# Patient Record
Sex: Male | Born: 1955 | Race: White | Hispanic: No | Marital: Married | Smoking: Former smoker
Health system: Southern US, Community
[De-identification: ages and names within clinical notes are randomized; demographics above are authoritative.]

## PROBLEM LIST (undated history)

## (undated) DIAGNOSIS — E119 Type 2 diabetes mellitus without complications: Secondary | ICD-10-CM

## (undated) DIAGNOSIS — K644 Residual hemorrhoidal skin tags: Secondary | ICD-10-CM

## (undated) DIAGNOSIS — H269 Unspecified cataract: Secondary | ICD-10-CM

## (undated) DIAGNOSIS — N529 Male erectile dysfunction, unspecified: Secondary | ICD-10-CM

## (undated) DIAGNOSIS — R1310 Dysphagia, unspecified: Secondary | ICD-10-CM

## (undated) DIAGNOSIS — Q796 Ehlers-Danlos syndrome, unspecified: Secondary | ICD-10-CM

## (undated) DIAGNOSIS — I059 Rheumatic mitral valve disease, unspecified: Secondary | ICD-10-CM

## (undated) HISTORY — DX: Ehlers-Danlos syndrome, unspecified: Q79.60

## (undated) HISTORY — DX: Rheumatic mitral valve disease, unspecified: I05.9

## (undated) HISTORY — DX: Residual hemorrhoidal skin tags: K64.4

## (undated) HISTORY — DX: Type 2 diabetes mellitus without complications: E11.9

## (undated) HISTORY — PX: COLONOSCOPY: SHX174

## (undated) HISTORY — DX: Unspecified cataract: H26.9

## (undated) HISTORY — DX: Dysphagia, unspecified: R13.10

## (undated) HISTORY — DX: Male erectile dysfunction, unspecified: N52.9

## (undated) HISTORY — PX: CARDIAC CATHETERIZATION: SHX172

---

## 2006-08-07 ENCOUNTER — Ambulatory Visit: Payer: Self-pay | Admitting: Gastroenterology

## 2006-09-01 HISTORY — PX: COLONOSCOPY: SHX174

## 2006-09-10 ENCOUNTER — Ambulatory Visit: Payer: Self-pay | Admitting: Gastroenterology

## 2006-09-24 LAB — HM COLONOSCOPY: HM Colonoscopy: NORMAL

## 2009-01-18 ENCOUNTER — Encounter: Admission: RE | Admit: 2009-01-18 | Discharge: 2009-01-18 | Payer: Self-pay | Admitting: Orthopedic Surgery

## 2009-05-17 ENCOUNTER — Ambulatory Visit: Payer: Self-pay | Admitting: Family Medicine

## 2009-05-17 DIAGNOSIS — N529 Male erectile dysfunction, unspecified: Secondary | ICD-10-CM | POA: Insufficient documentation

## 2009-05-17 DIAGNOSIS — I059 Rheumatic mitral valve disease, unspecified: Secondary | ICD-10-CM | POA: Insufficient documentation

## 2009-05-17 DIAGNOSIS — K644 Residual hemorrhoidal skin tags: Secondary | ICD-10-CM | POA: Insufficient documentation

## 2009-05-17 HISTORY — DX: Residual hemorrhoidal skin tags: K64.4

## 2009-05-17 HISTORY — DX: Male erectile dysfunction, unspecified: N52.9

## 2009-05-17 HISTORY — DX: Rheumatic mitral valve disease, unspecified: I05.9

## 2010-05-28 ENCOUNTER — Ambulatory Visit: Payer: Self-pay | Admitting: Family Medicine

## 2010-05-28 DIAGNOSIS — R1310 Dysphagia, unspecified: Secondary | ICD-10-CM | POA: Insufficient documentation

## 2010-05-28 DIAGNOSIS — F411 Generalized anxiety disorder: Secondary | ICD-10-CM | POA: Insufficient documentation

## 2010-05-28 HISTORY — DX: Dysphagia, unspecified: R13.10

## 2010-06-03 ENCOUNTER — Ambulatory Visit (HOSPITAL_COMMUNITY): Admission: RE | Admit: 2010-06-03 | Discharge: 2010-06-03 | Payer: Self-pay | Admitting: Family Medicine

## 2010-10-01 NOTE — Assessment & Plan Note (Signed)
Summary: MED CHECK/REFILL/CJR/pt rescd//ccm   Vital Signs:  Patient profile:   55 year old male Weight:      226 pounds Temp:     97.8 degrees F oral BP sitting:   120 / 80  (left arm) Cuff size:   large  Vitals Entered By: Sid Falcon LPN (May 28, 2010 10:00 AM)  History of Present Illness: Patient is here for medical followup.  Medical problems include history of anxiety, erectile dysfunction, Ehlers-Danlos with history of mitral valve prolapse. He has not had any dyspnea or chest pain. Anxiety and mood symptoms are well-controlled. Needs refills of Zoloft. Takes Cialis for erectile dysfunction.  New problem of intermittent dysphagia with solid foods. His symptoms are inconsistent and relatively mild. He has noticed that food seems slower going down at times.  No problem with liquids.  No abdominal pain.   No history of smoking. Occasional alcohol use. No appetite or weight changes. No hematemesis. No pain with swallowing.  Allergies (verified): No Known Drug Allergies  Past History:  Family History: Last updated: 05/17/2009 Family History Diabetes father Family History Hypertension father Family History Lung cancer father Family history basel cell father  Social History: Last updated: 05/17/2009 Occupation:  Airline pilot Married Never Smoked Alcohol use-no Regular exercise-no  Risk Factors: Exercise: no (05/17/2009)  Risk Factors: Smoking Status: never (05/17/2009)  Past Medical History: Mitro Valve Prolapse Ehler's Danlos Erectlile dysfunction. Anxiety PMH-FH-SH reviewed for relevance  Review of Systems  The patient denies anorexia, fever, weight loss, chest pain, abdominal pain, melena, hematochezia, and severe indigestion/heartburn.    Physical Exam  General:  Well-developed,well-nourished,in no acute distress; alert,appropriate and cooperative throughout examination Head:  Normocephalic and atraumatic without obvious abnormalities. No apparent  alopecia or balding. Mouth:  Oral mucosa and oropharynx without lesions or exudates.  Teeth in good repair. Neck:  No deformities, masses, or tenderness noted. Lungs:  Normal respiratory effort, chest expands symmetrically. Lungs are clear to auscultation, no crackles or wheezes. Heart:  regular rhythm and rate with murmur which is 2/6 left arm border and right upper sternal border. He has midsystolic click Abdomen:  Bowel sounds positive,abdomen soft and non-tender without masses, organomegaly or hernias noted. Extremities:  No clubbing, cyanosis, edema, or deformity noted with normal full range of motion of all joints.     Impression & Recommendations:  Problem # 1:  ERECTILE DYSFUNCTION, ORGANIC (ICD-607.84)  His updated medication list for this problem includes:    Cialis 20 Mg Tabs (Tadalafil) ..... One by mouth every other day as needed  Problem # 2:  DYSPHAGIA UNSPECIFIED (ICD-787.20) Assessment: New DDX esoph spasm, Schatzki ring, vs other.  No worrisome symptoms such as weight loss.  Barium swallow study. Orders: Radiology Referral (Radiology)  Problem # 3:  ANXIETY STATE, UNSPECIFIED (ICD-300.00) refilled med for one year. His updated medication list for this problem includes:    Zoloft 50 Mg Tabs (Sertraline hcl) ..... Once daily  Complete Medication List: 1)  Zoloft 50 Mg Tabs (Sertraline hcl) .... Once daily 2)  Triamcinolone Acetonide 0.1 % Crea (Triamcinolone acetonide) .... Apply to affected area two times a day as needed 3)  Cialis 20 Mg Tabs (Tadalafil) .... One by mouth every other day as needed  Other Orders: Admin 1st Vaccine (95621) Flu Vaccine 48yrs + (30865)  Patient Instructions: 1)  Consider complete physical later this year. Prescriptions: CIALIS 20 MG TABS (TADALAFIL) one by mouth every other day as needed  #12 x 3   Entered and Authorized  by:   Evelena Peat MD   Signed by:   Evelena Peat MD on 05/28/2010   Method used:   [facility]         [facility]  [place] [address] [facility]       [address]       [place], [place]  [address]       Ph: [phone] or [phone]       Fax: [phone]   RxID:   [id number] ZOLOFT 50 MG TABS (SERTRALINE HCL) once daily  #90 x 3   Entered and Authorized by:   Evelena Peat MD   Signed by:   Evelena Peat MD on 05/28/2010   Method used:   [facility]        [facility]  [place] [address]       [address]       [place], [place]  [address]       Ph: [phone] or [phone]       Fax: [phone]   RxID:   [id number]     Flu Vaccine Consent Questions     Do you have a history of severe allergic reactions to this vaccine? no    Any prior history of allergic reactions to egg and/or gelatin? no    Do you have a sensitivity to the preservative Thimersol? no    Do you have a past history of Guillan-Barre Syndrome? no    Do you currently have an acute febrile illness? no    Have you ever had a severe reaction to latex? no    Vaccine information given and explained to patient? yes    Are you currently pregnant? no    Lot Number:[id number]   Exp Date:03/01/2011   Site Given  Left Deltoid IMbflu

## 2010-10-01 NOTE — Assessment & Plan Note (Signed)
Summary: TO EST-[place]//CCM   Vital Signs:  Patient profile:   55 year old male Height:      73.50 inches Weight:      226 pounds BMI:     29.52 Temp:     98.2 degrees F oral Pulse rate:   72 / minute Pulse rhythm:   regular Resp:     12 per minute BP sitting:   120 / 80  (left arm) Cuff size:   regular  Vitals Entered By: Sid Falcon LPN (May 17, 2009 3:10 PM)  Nutrition Counseling: Patient's BMI is greater than 25 and therefore counseled on weight management options.  History of Present Illness: Patient is seen to establish care.   history of Ehlers-Danlos syndrome and mitral valve prolapse.  he has had prior history of depression and would like to get back on sertraline 50 mg daily which is tolerated well the past. Also history of erectile dysfunction and takes Cialis for that. He has recently had some external hemorrhoids which have caused some itching and irritation. Previous colonoscopy 3 years ago which was normal. No recent constipation symptoms. Preparation H without relief.  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 Alcohol use-no Regular exercise-no  Risk Factors: Exercise: no (05/17/2009)  Risk Factors: Smoking Status: never (05/17/2009)  Past Medical History: Mitro Valve Prolapse  Family History: Family History Diabetes father Family History Hypertension father Family History Lung cancer father Family history basel cell father  Social History: Occupation:  Airline pilot Married Never Smoked Alcohol use-no Regular  exercise-no Occupation:  employed Smoking Status:  never Does Patient Exercise:  no  Review of Systems  The patient denies anorexia, fever, weight loss, chest pain, dyspnea on exertion, peripheral edema, abdominal pain, syncope, headaches, severe indigestion/heartburn, and muscle weakness.    Physical Exam  General:  Well-developed,well-nourished,in no acute distress; alert,appropriate and cooperative throughout examination Mouth:  Oral mucosa and oropharynx without lesions or exudates.  Teeth in good repair. Neck:  No deformities, masses, or tenderness noted. Lungs:  Normal respiratory effort, chest expands symmetrically. Lungs are clear to auscultation, no crackles or wheezes. Heart:  regular rhythm and rate with 2-3/6 systolic murmur left sternal border. Extremities:  no edema noted. Psych:  Cognition and judgment appear intact. Alert and cooperative with normal attention span and concentration. No apparent delusions, illusions, hallucinations   Impression & Recommendations:  Problem # 1:  MITRAL VALVE PROLAPSE (ICD-424.0) Asymptomatic.  Problem # 2:  ERECTILE DYSFUNCTION, ORGANIC (ICD-607.84) refill cialis. His updated medication list for this problem includes:    Cialis 20 Mg Tabs (Tadalafil) ..... One by mouth every other day as needed  Problem # 3:  EXTERNAL HEMORRHOIDS (ICD-455.3) try triamcinolone 0.1% cream as needed.  Complete Medication List: 1)  Zoloft 50 Mg Tabs (Sertraline hcl) .... Once daily 2)  Triamcinolone Acetonide 0.1 % Crea (Triamcinolone acetonide) .... Apply to affected area two times a day as needed 3)  Cialis 20 Mg Tabs (Tadalafil) .... One by mouth every other day as needed  Patient Instructions: 1)  It is important that you exercise reguarly at least 20 minutes 5 times a week. If you develop  chest pain, have severe difficulty breathing, or feel very tired, stop exercising immediately and seek medical attention.  Prescriptions: TRIAMCINOLONE  ACETONIDE 0.1 % CREA (TRIAMCINOLONE ACETONIDE) apply to affected area two times a day as needed  #30 gm x 1   Entered and Authorized by:   Evelena Peat MD   Signed by:   Evelena Peat MD on 05/17/2009   Method used:   [facility]        [facility]  [place] [address]       [address]       [place], [place]  [address]       Ph: [phone] or [phone]       Fax: [phone]   RxID:   [id number] ZOLOFT 50 MG TABS (SERTRALINE HCL) once daily  #90 x 3   Entered and Authorized by:   Evelena Peat MD   Signed by:   Evelena Peat MD on 05/17/2009   Method used:   [facility]        [facility]  [place] [address]       [address]       [place], [place]  [address]       Ph: [phone] or [phone]       Fax: [phone]   RxID:   [id number] CIALIS 20 MG TABS (TADALAFIL) one by mouth every other day as needed  #4 x 11   Entered and Authorized by:   Evelena Peat MD   Signed by:   Evelena Peat MD on 05/17/2009   Method used:   [facility]        [facility]  [place] [address]       [address]       [place], [place]  [address]       Ph: [phone] or [phone]       Fax: [phone]   RxID:   [id number] TRIAMCINOLONE ACETONIDE 0.1 % CREA (TRIAMCINOLONE ACETONIDE) apply to affected area two times a day as needed  #30 gm x 1   Entered and Authorized by:   Evelena Peat MD   Signed by:   Evelena Peat MD on 05/17/2009   Method used:   Print then Give to Patient   RxID:   [id number]   Preventive Care Screening  Colonoscopy:    Date:  09/01/2006    Results:  normal

## 2010-12-24 ENCOUNTER — Ambulatory Visit (INDEPENDENT_AMBULATORY_CARE_PROVIDER_SITE_OTHER): Payer: BC Managed Care – PPO | Admitting: Family Medicine

## 2010-12-24 ENCOUNTER — Encounter: Payer: Self-pay | Admitting: Family Medicine

## 2010-12-24 VITALS — BP 120/84 | Temp 98.6°F | Ht 73.25 in | Wt 231.0 lb

## 2010-12-24 DIAGNOSIS — S8010XA Contusion of unspecified lower leg, initial encounter: Secondary | ICD-10-CM

## 2010-12-24 NOTE — Patient Instructions (Signed)
Consider ACE wrap early in morning to decrease edema.

## 2010-12-24 NOTE — Progress Notes (Signed)
  Subjective:    Patient ID: Terry Johns, male    DOB: 05/19/1956, 55 y.o.   MRN: [id number]  HPI Patient seen with acute left leg injury which occurred about 9 days ago. Heavy metal chair fell on the leg directly. Some immediate swelling. Was able to ambulate without difficulty. Had significant bruising noted skin. His leg is actually less swollen and doing some better since the injury. He denies any foot pain. No weakness   Review of Systems     Objective:   Physical Exam  Constitutional: He appears well-developed and well-nourished.  Cardiovascular: Normal rate, regular rhythm and normal heart sounds.   No murmur heard. Pulmonary/Chest: Effort normal and breath sounds normal. He has no wheezes. He has no rales.  Musculoskeletal:       Patient has extensive ecchymosis involving the foot ankle and lower leg region. He has what appears to be a small hematoma medial distal right leg.  This is minimally tender to palpation. Achilles tendon is intact. Full range of motion with plantar flexion dorsiflexion. No significant bony tenderness fibula or tibia          Assessment & Plan:  Hematoma right lower leg related to contusion. Reassurance given. Continue elevation and consider compressive wrap to reduce edema if on his feet a lot.

## 2011-01-28 ENCOUNTER — Observation Stay (HOSPITAL_COMMUNITY)
Admission: EM | Admit: 2011-01-28 | Discharge: 2011-01-29 | Disposition: A | Discharge status: Home / Self Care | Payer: BC Managed Care – PPO | Attending: Emergency Medicine | Admitting: Emergency Medicine

## 2011-01-28 ENCOUNTER — Emergency Department (HOSPITAL_COMMUNITY): Payer: BC Managed Care – PPO

## 2011-01-28 ENCOUNTER — Telehealth: Payer: Self-pay | Admitting: *Deleted

## 2011-01-28 DIAGNOSIS — R61 Generalized hyperhidrosis: Secondary | ICD-10-CM | POA: Insufficient documentation

## 2011-01-28 DIAGNOSIS — I059 Rheumatic mitral valve disease, unspecified: Secondary | ICD-10-CM | POA: Insufficient documentation

## 2011-01-28 DIAGNOSIS — R0602 Shortness of breath: Principal | ICD-10-CM | POA: Insufficient documentation

## 2011-01-28 DIAGNOSIS — R5383 Other fatigue: Secondary | ICD-10-CM | POA: Insufficient documentation

## 2011-01-28 DIAGNOSIS — F411 Generalized anxiety disorder: Secondary | ICD-10-CM | POA: Insufficient documentation

## 2011-01-28 DIAGNOSIS — R5381 Other malaise: Secondary | ICD-10-CM | POA: Insufficient documentation

## 2011-01-28 LAB — CBC
HCT: 42.1 % (ref 39.0–52.0)
Hemoglobin: 14.9 g/dL (ref 13.0–17.0)
MCH: 29.3 pg (ref 26.0–34.0)
MCHC: 35.4 g/dL (ref 30.0–36.0)
MCV: 82.9 fL (ref 78.0–100.0)
Platelets: 206 10*3/uL (ref 150–400)
RBC: 5.08 MIL/uL (ref 4.22–5.81)
RDW: 13.1 % (ref 11.5–15.5)
WBC: 6.5 10*3/uL (ref 4.0–10.5)

## 2011-01-28 LAB — BASIC METABOLIC PANEL
BUN: 17 mg/dL (ref 6–23)
CO2: 25 mEq/L (ref 19–32)
Calcium: 8.8 mg/dL (ref 8.4–10.5)
Chloride: 103 mEq/L (ref 96–112)
Creatinine, Ser: 0.79 mg/dL (ref 0.4–1.5)
GFR calc Af Amer: >60 mL/min (ref >60)
GFR calc non Af Amer: >60 mL/min (ref >60)
Glucose, Bld: 93 mg/dL (ref 70–99)
Potassium: 4.1 mEq/L (ref 3.5–5.1)
Sodium: 139 mEq/L (ref 135–145)

## 2011-01-28 LAB — CK TOTAL AND CKMB (NOT AT ARMC)
CK, MB: 3.4 ng/mL (ref 0.3–4.0)
CK, MB: 3.2 ng/mL (ref 0.3–4.0)
CK, MB: 2.9 ng/mL (ref 0.3–4.0)
Relative Index: 2.7 — ABNORMAL HIGH (ref 0.0–2.5)
Relative Index: 2.6 — ABNORMAL HIGH (ref 0.0–2.5)
Relative Index: 2.6 — ABNORMAL HIGH (ref 0.0–2.5)
Total CK: 127 U/L (ref 7–232)
Total CK: 121 U/L (ref 7–232)
Total CK: 112 U/L (ref 7–232)

## 2011-01-28 LAB — URINALYSIS, ROUTINE W REFLEX MICROSCOPIC
Glucose, UA: NEGATIVE mg/dL
Hgb urine dipstick: NEGATIVE
Ketones, ur: NEGATIVE mg/dL
Nitrite: NEGATIVE
Protein, ur: NEGATIVE mg/dL
Specific Gravity, Urine: 1.029 (ref 1.005–1.030)
Urobilinogen, UA: 0.2 mg/dL (ref 0.0–1.0)
pH: 5.5 (ref 5.0–8.0)

## 2011-01-28 LAB — DIFFERENTIAL
Basophils Absolute: 0 10*3/uL (ref 0.0–0.1)
Basophils Relative: 1 % (ref 0–1)
Eosinophils Absolute: 0 10*3/uL (ref 0.0–0.7)
Eosinophils Relative: 1 % (ref 0–5)
Lymphocytes Relative: 25 % (ref 12–46)
Lymphs Abs: 1.6 10*3/uL (ref 0.7–4.0)
Monocytes Absolute: 0.6 10*3/uL (ref 0.1–1.0)
Monocytes Relative: 10 % (ref 3–12)
Neutro Abs: 4.1 10*3/uL (ref 1.7–7.7)
Neutrophils Relative %: 64 % (ref 43–77)

## 2011-01-28 LAB — TROPONIN I
Troponin I: <0.3 ng/mL (ref <0.30)
Troponin I: <0.3 ng/mL (ref <0.30)
Troponin I: <0.3 ng/mL (ref <0.30)

## 2011-01-28 NOTE — Telephone Encounter (Signed)
Pt has SOB upon exertion, heaviness in neck and arms.  Symptoms sound suspicious enough to send him to the ER ASAP.  He will go to ER now.

## 2011-01-28 NOTE — Telephone Encounter (Signed)
Agree with advice

## 2011-01-29 ENCOUNTER — Ambulatory Visit: Payer: BC Managed Care – PPO | Admitting: Family Medicine

## 2011-01-29 DIAGNOSIS — R072 Precordial pain: Secondary | ICD-10-CM

## 2011-01-29 LAB — TROPONIN I: Troponin I: <0.3 ng/mL (ref <0.30)

## 2011-01-29 LAB — CK TOTAL AND CKMB (NOT AT ARMC)
CK, MB: 2.6 ng/mL (ref 0.3–4.0)
Relative Index: 2.6 — ABNORMAL HIGH (ref 0.0–2.5)
Total CK: 101 U/L (ref 7–232)

## 2011-01-31 ENCOUNTER — Encounter: Payer: Self-pay | Admitting: Family Medicine

## 2011-01-31 ENCOUNTER — Ambulatory Visit (INDEPENDENT_AMBULATORY_CARE_PROVIDER_SITE_OTHER): Payer: BC Managed Care – PPO | Admitting: Family Medicine

## 2011-01-31 DIAGNOSIS — R0609 Other forms of dyspnea: Secondary | ICD-10-CM

## 2011-01-31 DIAGNOSIS — R0989 Other specified symptoms and signs involving the circulatory and respiratory systems: Secondary | ICD-10-CM

## 2011-01-31 DIAGNOSIS — Q796 Ehlers-Danlos syndrome, unspecified: Secondary | ICD-10-CM

## 2011-01-31 DIAGNOSIS — R05 Cough: Secondary | ICD-10-CM

## 2011-01-31 DIAGNOSIS — R059 Cough, unspecified: Secondary | ICD-10-CM

## 2011-01-31 DIAGNOSIS — R0600 Dyspnea, unspecified: Secondary | ICD-10-CM

## 2011-01-31 NOTE — Patient Instructions (Signed)
Gradually increase your exercise levels. Follow up promptly for any progressive chest pain.

## 2011-01-31 NOTE — Progress Notes (Signed)
  Subjective:    Patient ID: Terry Johns, male    DOB: 12-25-1955, 55 y.o.   MRN: [id number]  HPI Presents for hospital followup. Hospital records reviewed. He was out walking with his wife on Tuesday this week and during walking felt very short of breath. Went home and took a shower and wasfeeling better and with climbing stairs felt weak and short of breath and lightheaded. No syncope. At that point went to ER for further evaluation. No chest pain. No cardiac history other than mitral valve prolapse. History of Ehlers-Danlos syndrome. Patient was ruled out for MI. Labs were basically unremarkable. EKG without acute change.  ECHO basically unremarkable.  Patient underwent stress echo day after admission. Achieved target heart rate. No abnormalities noted. Normal ejection fraction. Patient has known history of mitral valve prolapse. Doing well since discharge. Has not tried any exercise.  He has a dry cough of one-day duration. No pleuritic pain. No recent GERD symptoms. Patient is nonsmoker  Review of Systems  Constitutional: Negative for fever, chills, activity change, appetite change, fatigue and unexpected weight change.  HENT: Negative for sore throat.   Respiratory: Positive for cough. Negative for wheezing.   Cardiovascular: Negative for chest pain, palpitations and leg swelling.  Musculoskeletal: Negative for arthralgias.  Skin: Negative for rash.  Neurological: Negative for dizziness, syncope, weakness and light-headedness.  Hematological: Negative for adenopathy. Does not bruise/bleed easily.       Objective:   Physical Exam  Constitutional: He is oriented to person, place, and time. He appears well-developed and well-nourished. No distress.  HENT:  Mouth/Throat: Oropharynx is clear and moist.  Neck: Neck supple.  Cardiovascular: Normal rate and regular rhythm.  Exam reveals no gallop.   Murmur heard. Pulmonary/Chest: Effort normal and breath sounds normal. No respiratory  distress. He has no wheezes. He has no rales.  Musculoskeletal: He exhibits no edema.  Lymphadenopathy:    He has no cervical adenopathy.  Neurological: He is alert and oriented to person, place, and time.          Assessment & Plan:  Recent exertional dyspnea. Negative evaluation as above. Patient will try to resume exercise regimen gradually and progress as tolerated. Prompt follow up for any recurrent problems. Cough.  Probable acute viral bronchitis.  Observe.

## 2011-06-12 ENCOUNTER — Ambulatory Visit (INDEPENDENT_AMBULATORY_CARE_PROVIDER_SITE_OTHER): Payer: BC Managed Care – PPO | Admitting: Family Medicine

## 2011-06-12 DIAGNOSIS — Z23 Encounter for immunization: Secondary | ICD-10-CM

## 2011-07-01 ENCOUNTER — Telehealth: Payer: Self-pay | Admitting: Family Medicine

## 2011-07-01 MED ORDER — TADALAFIL 20 MG PO TABS: 20.0000 mg | ORAL_TABLET | Freq: Every day | ORAL | Status: DC | PRN

## 2011-07-01 MED ORDER — SERTRALINE HCL 50 MG PO TABS: 50.0000 mg | ORAL_TABLET | Freq: Every day | ORAL | Status: DC

## 2011-07-01 NOTE — Telephone Encounter (Signed)
Pt requesting refill on sertraline (ZOLOFT) 50 MG tablet  tadalafil (CIALIS) 20 MG tablet   [facility]

## 2011-09-23 IMAGING — RF DG ESOPHAGUS
10 of 16 series · 14 of 24 positions shown · non-contrast
Comparison: None.

CLINICAL DATA: Chest pain/food sticks in throat

ESOPHOGRAM/BARIUM SWALLOW
TECHNIQUE: Single contrast examination was performed using thick
and thin barium.
Fluoroscopy time:  2.3 minutes.

[Series 1: run · 2 of 8 slices shown (1 of 10)]
[im 1/8]
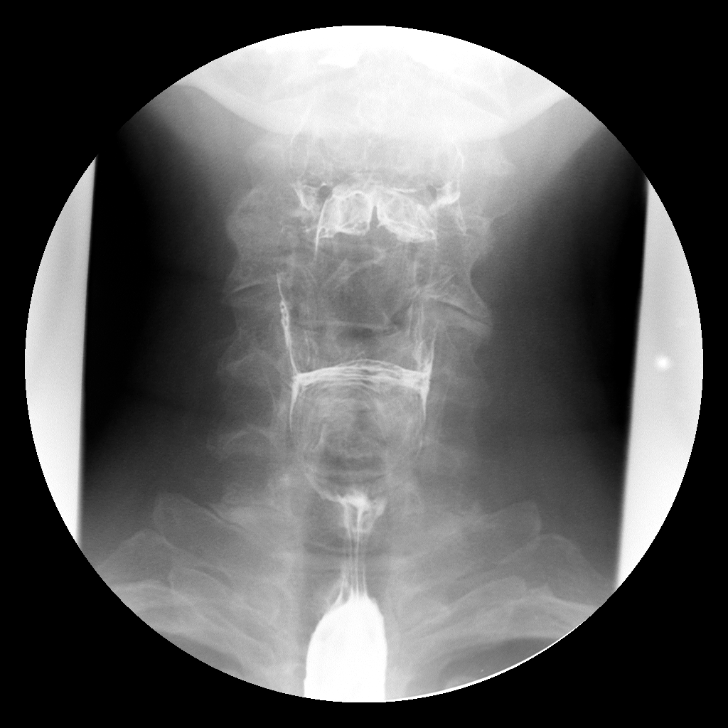
[im 5/8]
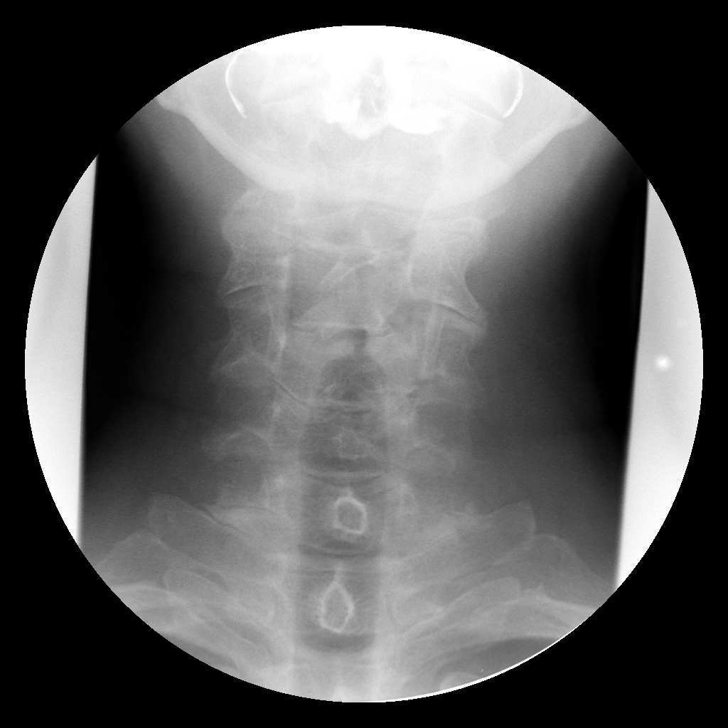

[Series 2: run · 4 of 10 slices shown (2 of 10)]
[im 1/10]
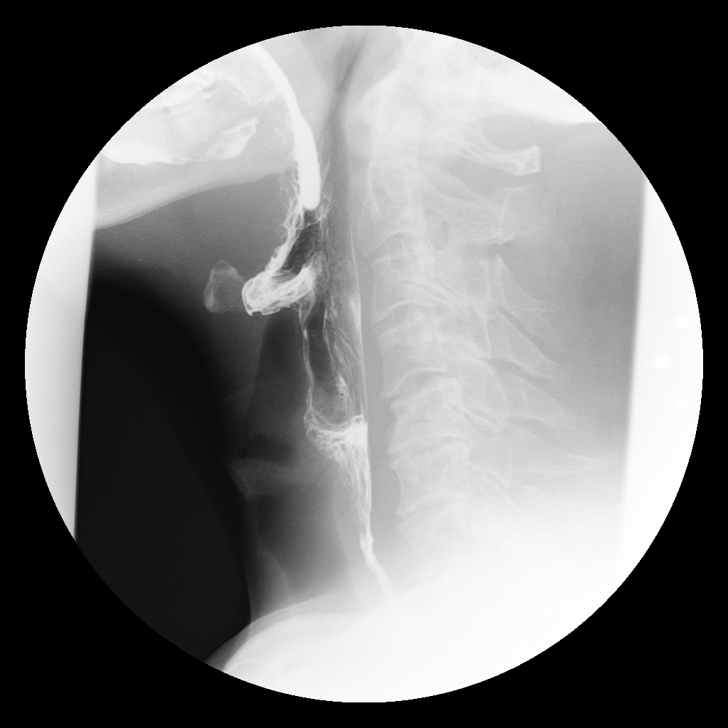
[im 4/10]
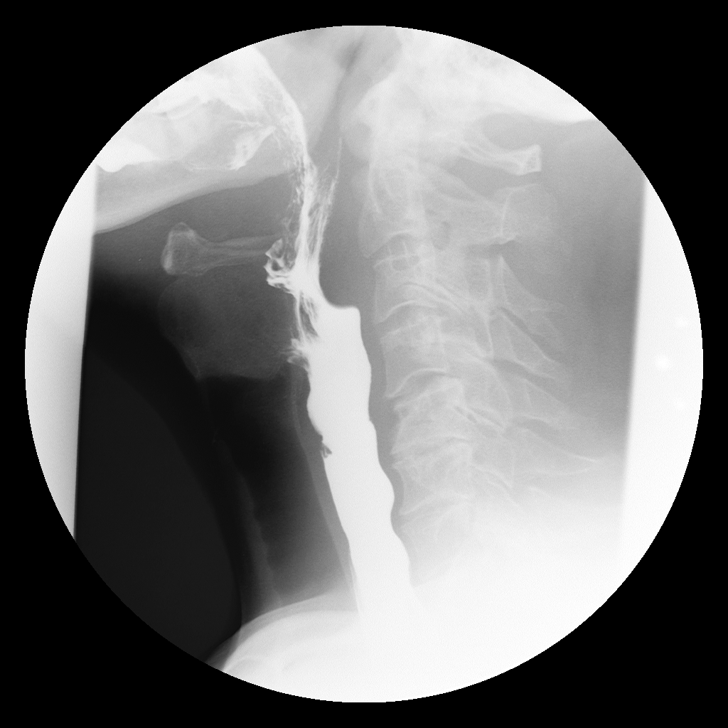
[im 6/10]
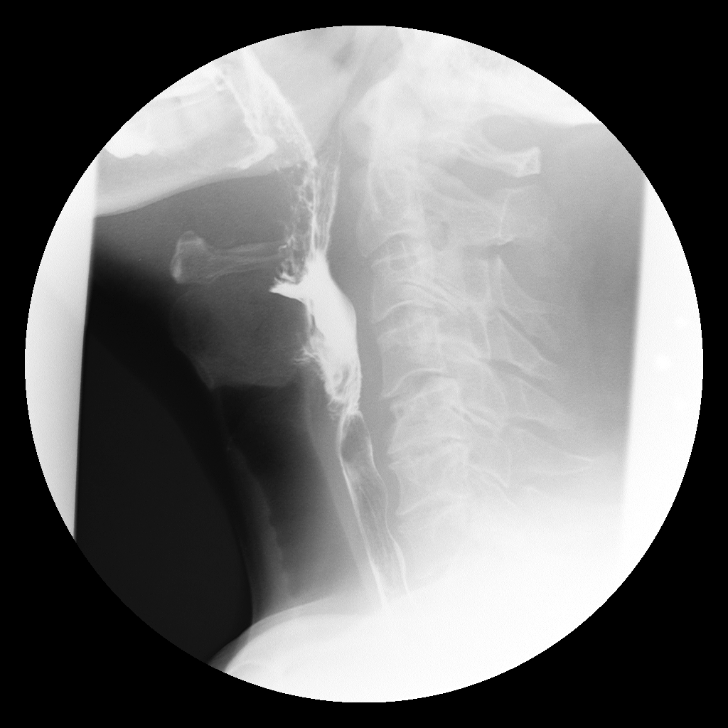
[im 10/10]
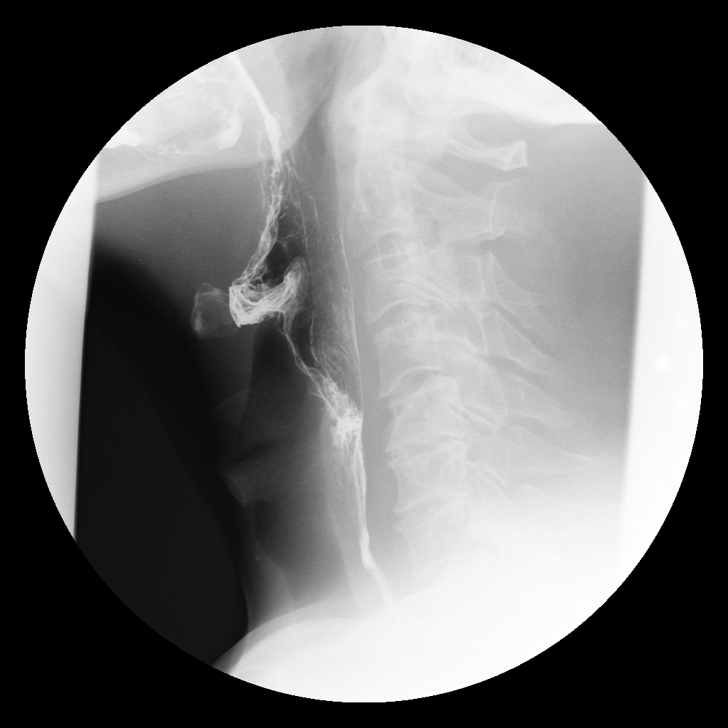

[Series 4: run · 1 of 1 slices shown (3 of 10)]
[im 1/1]
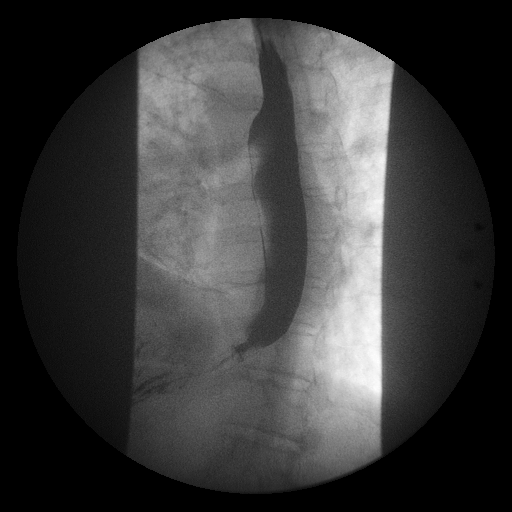

[Series 5: run · 1 of 1 slices shown (4 of 10)]
[im 1/1]
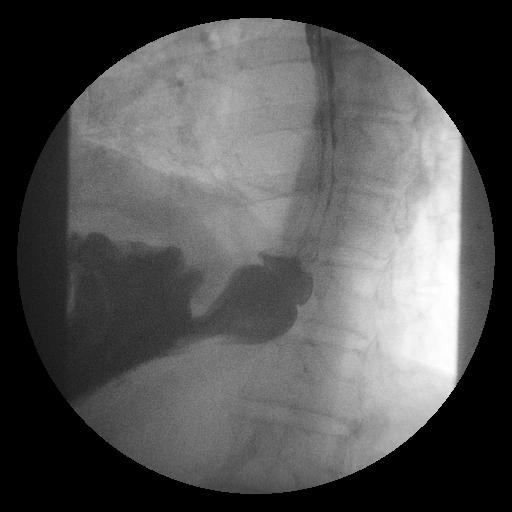

[Series 7: run · 1 of 1 slices shown (5 of 10)]
[im 1/1]
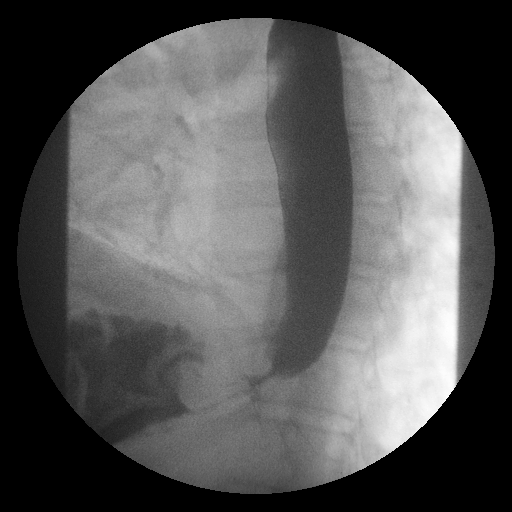

[Series 9: run · 1 of 1 slices shown (6 of 10)]
[im 1/1]
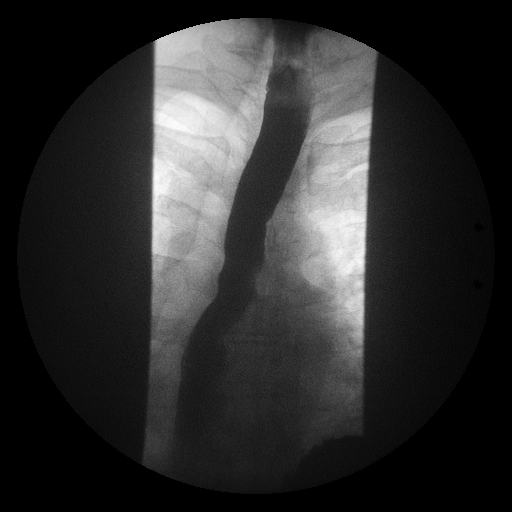

[Series 11: run · 1 of 1 slices shown (7 of 10)]
[im 1/1]
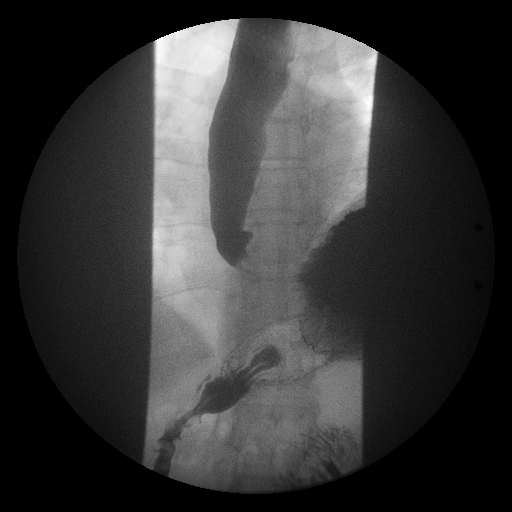

[Series 12: run · 1 of 1 slices shown (8 of 10)]
[im 1/1]
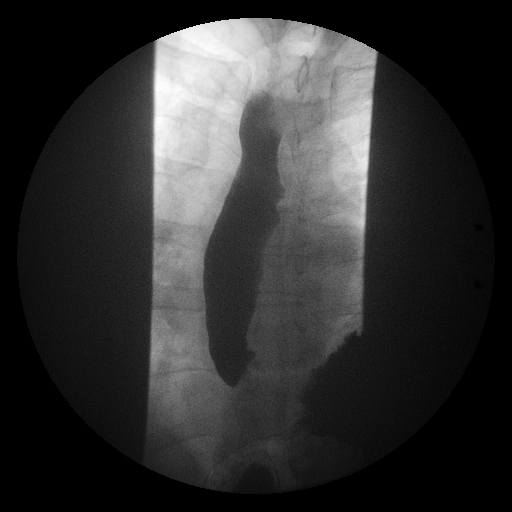

[Series 14: run · 1 of 1 slices shown (9 of 10)]
[im 1/1]
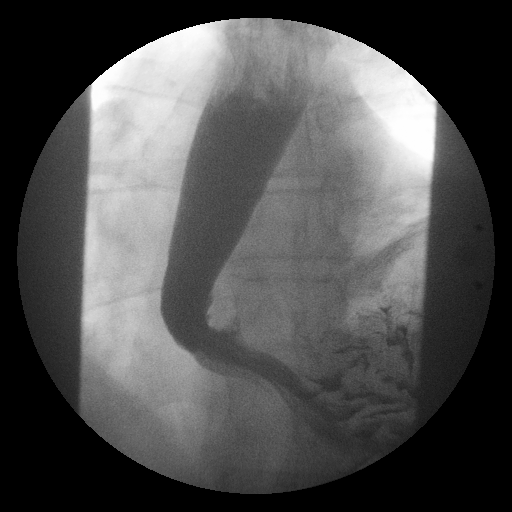

[Series 16: run · 1 of 1 slices shown (10 of 10)]
[im 1/1]
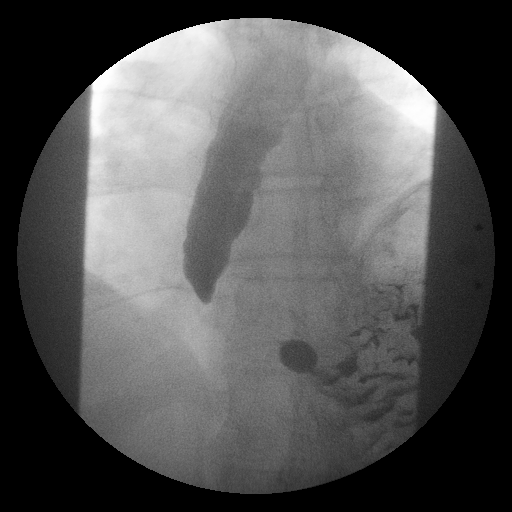

[14 of 24 positions shown; findings below may reference images not displayed]

FINDINGS: Three per second spot images through the cervical region
show no abnormality.  The swallowing mechanism is normal.  No
penetration or aspiration noted.

There are no lesions of the remainder of the esophagus.  There is,
however, a small sliding hiatal hernia.  There is no visible
narrowing of the lumen, but a 13 mm barium tablet took 30-40
seconds to traverse this region, suggesting a mild stricture.
There is also intermittent tertiary contraction of the esophagus
noted with ineffective peristalsis.

No reflux was demonstrated with Valsalva or water siphon maneuvers.
IMPRESSION: Small sliding hiatal hernia with probable mild stricture and some
intermittent spasm.  No reflux demonstrated at the time of this
exam.

## 2011-12-31 ENCOUNTER — Other Ambulatory Visit: Payer: Self-pay | Admitting: Family Medicine

## 2012-04-02 ENCOUNTER — Other Ambulatory Visit: Payer: Self-pay | Admitting: Family Medicine

## 2012-05-11 ENCOUNTER — Other Ambulatory Visit: Payer: Self-pay | Admitting: Family Medicine

## 2012-06-25 ENCOUNTER — Other Ambulatory Visit: Payer: Self-pay | Admitting: Family Medicine

## 2012-07-14 ENCOUNTER — Ambulatory Visit (INDEPENDENT_AMBULATORY_CARE_PROVIDER_SITE_OTHER): Payer: BC Managed Care – PPO | Admitting: *Deleted

## 2012-07-14 DIAGNOSIS — Z23 Encounter for immunization: Secondary | ICD-10-CM

## 2013-06-01 ENCOUNTER — Encounter: Payer: Self-pay | Admitting: Family Medicine

## 2013-06-01 ENCOUNTER — Ambulatory Visit (INDEPENDENT_AMBULATORY_CARE_PROVIDER_SITE_OTHER): Payer: BC Managed Care – PPO | Admitting: Family Medicine

## 2013-06-01 VITALS — BP 118/80 | HR 68 | Temp 98.2°F | Wt 223.0 lb

## 2013-06-01 DIAGNOSIS — I4949 Other premature depolarization: Secondary | ICD-10-CM

## 2013-06-01 DIAGNOSIS — I493 Ventricular premature depolarization: Secondary | ICD-10-CM

## 2013-06-01 DIAGNOSIS — R011 Cardiac murmur, unspecified: Secondary | ICD-10-CM

## 2013-06-01 NOTE — Patient Instructions (Addendum)
Premature Beats °A premature beat is an extra heartbeat that happens earlier than normal. Premature beats are called premature atrial contractions (PACs) or premature ventricular contractions (PVCs) depending on the area of the heart where they start. °CAUSES  °Premature beats may be brought on by a variety of factors including: °· Emotional stress. °· Lack of sleep. °· Caffeine. °· Asthma medicines. °· Stimulants. °· Herbal teas. °· Dietary supplements. °· Alcohol. °In most cases, premature beats are not dangerous and are not a sign of serious heart disease. Most patients evaluated for premature beats have completely normal heart function. Rarely, premature beats may be a sign of more significant heart problems or medical illness. °SYMPTOMS  °Premature beats may cause palpitations. This means you feel like your heart is skipping a beat or beating harder than usual. Sometimes, slight chest pain occurs with premature beats, lasting only a few seconds. This pain has been described as a "flopping" feeling inside the chest. In many cases, premature beats do not cause any symptoms and they are only detected when an electrocardiography test (EKG) or heart monitoring is performed. °DIAGNOSIS  °Your caregiver may run some tests to evaluate your heart such as an EKG or echocardiography. You may need to wear a portable heart monitor for several days to record the electrical activity of your heart. Blood testing may also be performed to check your electrolytes and thyroid function. °TREATMENT  °Premature beats usually go away with rest. If the problem continues, your caregiver will determine a treatment plan for you.  °HOME CARE INSTRUCTIONS °· Get plenty of rest over the next few days until your symptoms improve. °· Avoid coffee, tea, alcohol, and soda (pop, cola). °· Do not smoke. °SEEK MEDICAL CARE IF: °· Your symptoms continue after 1 to 2 days of rest. °· You have new symptoms, such as chest pain or trouble  breathing. °SEEK IMMEDIATE MEDICAL CARE IF: °· You have severe chest pain or abdominal pain. °· You have pain that radiates into the neck, arm, or jaw. °· You faint or have extreme weakness. °· You have shortness of breath. °· Your heartbeat races for more than 5 seconds. °MAKE SURE YOU: °· Understand these instructions. °· Will watch your condition. °· Will get help right away if you are not doing well or get worse. °Document Released: 09/25/2004 Document Revised: 11/10/2011 Document Reviewed: 04/21/2011 °[employer]® Patient Information ©2014 [employer]. ° °

## 2013-06-01 NOTE — Progress Notes (Signed)
  Subjective:    Patient ID: Terry Johns, male    DOB: Jan 28, 1956, 57 y.o.   MRN: [id number]  HPI Is seen regarding recent PACs and PVCs there were seen on EKG He was out in [place] visiting friends and his friend had some type of new technology that performed EKG. He had PACs and PVCs which are somewhat infrequent. Patient is asymptomatic of the fat has had a couple episodes recently of dyspnea with things like climbing stairs. He has never had any dizziness or syncope. No chest pains. Minimal caffeine use. No consistent alcohol use.  Patient has history of Ehlers-Danlos syndrome. He has had previously diagnosed mitral prolapse but has not had echo in several years. He currently does not take any regular medications.  Past Medical History  Diagnosis Date  . Anxiety state, unspecified 05/28/2010  . MITRAL VALVE PROLAPSE 05/17/2009  . EXTERNAL HEMORRHOIDS 05/17/2009  . 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 DYSPHAGIA UNSPECIFIED 05/28/2010   No past surgical history on file.  reports that he has quit smoking. His smoking use included Cigarettes. He has a .3 pack-year smoking history. He does not have any smokeless tobacco history on file. His alcohol and drug histories are not on file. family history includes Cancer in his father; Diabetes in his father; Hypertension in his father. No Known Allergies    Review of Systems  Constitutional: Negative for fatigue.  Eyes: Negative for visual disturbance.  Respiratory: Negative for cough, chest tightness and shortness of breath.   Cardiovascular: Negative for chest pain, palpitations and leg swelling.  Neurological: Negative for dizziness, syncope, weakness, light-headedness and headaches.       Objective:   Physical Exam  Constitutional: He appears well-developed and well-nourished.  Neck: Neck supple. No thyromegaly present.  Cardiovascular: Normal rate.   Murmur heard. Patient has occasional premature beat. He has fairly  prominent at least 3/6 systolic murmur heard best left sternal border.  Pulmonary/Chest: Effort normal and breath sounds normal. No respiratory distress. He has no wheezes. He has no rales.  Musculoskeletal: He exhibits no edema.          Assessment & Plan:  Patient presents with EKG revealing recent PACs and PVCs we explained these are usually insignificant. Has had some recent mild dyspnea with exertion. History of Ehlers-Danlos syndrome and mitral prolapse. Repeat echocardiogram. Consider cardiology followup if this has shown any changes

## 2013-06-14 ENCOUNTER — Ambulatory Visit (HOSPITAL_COMMUNITY): Payer: BC Managed Care – PPO | Attending: Family Medicine

## 2013-06-14 DIAGNOSIS — R011 Cardiac murmur, unspecified: Secondary | ICD-10-CM | POA: Insufficient documentation

## 2013-06-14 NOTE — Progress Notes (Signed)
Echocardiogram performed.  

## 2013-09-21 ENCOUNTER — Encounter: Payer: Self-pay | Admitting: Family Medicine

## 2013-09-21 ENCOUNTER — Ambulatory Visit (INDEPENDENT_AMBULATORY_CARE_PROVIDER_SITE_OTHER): Payer: 59 | Admitting: Family Medicine

## 2013-09-21 VITALS — BP 120/68 | HR 101 | Temp 97.9°F | Wt 228.0 lb

## 2013-09-21 DIAGNOSIS — J209 Acute bronchitis, unspecified: Secondary | ICD-10-CM

## 2013-09-21 MED ORDER — HYDROCODONE-HOMATROPINE 5-1.5 MG/5ML PO SYRP: 5.0000 mL | ORAL_SOLUTION | Freq: Four times a day (QID) | ORAL | Status: AC | PRN

## 2013-09-21 NOTE — Patient Instructions (Signed)
Acute Bronchitis Bronchitis is inflammation of the airways that extend from the windpipe into the lungs (bronchi). The inflammation often causes mucus to develop. This leads to a cough, which is the most common symptom of bronchitis.  In acute bronchitis, the condition usually develops suddenly and goes away over time, usually in a couple weeks. Smoking, allergies, and asthma can make bronchitis worse. Repeated episodes of bronchitis may cause further lung problems.  CAUSES Acute bronchitis is most often caused by the same virus that causes a cold. The virus can spread from person to person (contagious).  SIGNS AND SYMPTOMS   Cough.   Fever.   Coughing up mucus.   Body aches.   Chest congestion.   Chills.   Shortness of breath.   Sore throat.  DIAGNOSIS  Acute bronchitis is usually diagnosed through a physical exam. Tests, such as chest X-rays, are sometimes done to rule out other conditions.  TREATMENT  Acute bronchitis usually goes away in a couple weeks. Often times, no medical treatment is necessary. Medicines are sometimes given for relief of fever or cough. Antibiotics are usually not needed but may be prescribed in certain situations. In some cases, an inhaler may be recommended to help reduce shortness of breath and control the cough. A cool mist vaporizer may also be used to help thin bronchial secretions and make it easier to clear the chest.  HOME CARE INSTRUCTIONS  Get plenty of rest.   Drink enough fluids to keep your urine clear or pale yellow (unless you have a medical condition that requires fluid restriction). Increasing fluids may help thin your secretions and will prevent dehydration.   Only take over-the-counter or prescription medicines as directed by your health care provider.   Avoid smoking and secondhand smoke. Exposure to cigarette smoke or irritating chemicals will make bronchitis worse. If you are a smoker, consider using nicotine gum or skin  patches to help control withdrawal symptoms. Quitting smoking will help your lungs heal faster.   Reduce the chances of another bout of acute bronchitis by washing your hands frequently, avoiding people with cold symptoms, and trying not to touch your hands to your mouth, nose, or eyes.   Follow up with your health care provider as directed.  SEEK MEDICAL CARE IF: Your symptoms do not improve after 1 week of treatment.  SEEK IMMEDIATE MEDICAL CARE IF:  You develop an increased fever or chills.   You have chest pain.   You have severe shortness of breath.  You have bloody sputum.   You develop dehydration.  You develop fainting.  You develop repeated vomiting.  You develop a severe headache. MAKE SURE YOU:   Understand these instructions.  Will watch your condition.  Will get help right away if you are not doing well or get worse. Document Released: 09/25/2004 Document Revised: 04/20/2013 Document Reviewed: 02/08/2013 [employer] Patient Information 2014 [employer].  

## 2013-09-21 NOTE — Progress Notes (Signed)
Pre visit review using our clinic review tool, if applicable. No additional management support is needed unless otherwise documented below in the visit note. 

## 2013-09-21 NOTE — Progress Notes (Signed)
   Subjective:    Patient ID: Terry Johns, male    DOB: 09/12/1955, 58 y.o.   MRN: [id number]  HPI Acute for cough which started last Tuesday. His cough worsened over the weekend by Saturday he had increased nasal congestion and especially severe nighttime cough. He's had some occasional right-sided thoracic back pain which he attributes to coughing. His nasal congestion has cleared somewhat. Cough is dry. Possibly some mild wheezing off and on. No dyspnea. No fever. He tried Mucinex DM and cough drops without any improvement. Patient nonsmoker.  Past Medical History  Diagnosis Date  . Anxiety state, unspecified 05/28/2010  . MITRAL VALVE PROLAPSE 05/17/2009  . EXTERNAL HEMORRHOIDS 05/17/2009  . 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 DYSPHAGIA UNSPECIFIED 05/28/2010   No past surgical history on file.  reports that he has quit smoking. His smoking use included Cigarettes. He has a .3 pack-year smoking history. He does not have any smokeless tobacco history on file. His alcohol and drug histories are not on file. family history includes Cancer in his father; Diabetes in his father; Hypertension in his father. No Known Allergies    Review of Systems  Constitutional: Negative for fever and chills.  HENT: Positive for congestion.   Respiratory: Positive for cough. Negative for shortness of breath and wheezing.   Cardiovascular: Negative for chest pain.  Gastrointestinal: Negative for nausea and vomiting.  Neurological: Negative for headaches.       Objective:   Physical Exam  Constitutional: He appears well-developed and well-nourished.  HENT:  Mouth/Throat: Oropharynx is clear and moist.  He has narrow ear canals but no acute findings  Neck: Neck supple.  Cardiovascular: Normal rate.   Pulmonary/Chest: Effort normal and breath sounds normal. No respiratory distress. He has no wheezes. He has no rales.  Musculoskeletal: He exhibits no edema.  Lymphadenopathy:    He has no  cervical adenopathy.          Assessment & Plan:  Acute bronchitis. Suspect viral. Hycodan cough syrup for severe nighttime cough. Followup promptly for fever or worsening symptoms

## 2013-10-21 ENCOUNTER — Other Ambulatory Visit: Payer: Self-pay | Admitting: Family Medicine

## 2013-11-10 ENCOUNTER — Ambulatory Visit (INDEPENDENT_AMBULATORY_CARE_PROVIDER_SITE_OTHER): Payer: 59 | Admitting: Family Medicine

## 2013-11-10 ENCOUNTER — Encounter: Payer: Self-pay | Admitting: Family Medicine

## 2013-11-10 VITALS — BP 128/72 | HR 83 | Wt 224.0 lb

## 2013-11-10 DIAGNOSIS — M722 Plantar fascial fibromatosis: Secondary | ICD-10-CM

## 2013-11-10 NOTE — Progress Notes (Signed)
Pre visit review using our clinic review tool, if applicable. No additional management support is needed unless otherwise documented below in the visit note. 

## 2013-11-10 NOTE — Progress Notes (Signed)
   Subjective:    Patient ID: Terry Johns, male    DOB: 1955-12-17, 58 y.o.   MRN: [id number]  Foot Pain Pertinent negatives include no numbness or weakness.    This is a with right heel pain. Present for about 4 months. No injury. He doesn't do any regular running. Pain is located over the plantar fascia region near attachment to the calcaneus. Worse first thing in the morning and after prolonged periods of sitting and generally improve slightly after activity. He is not any swelling. Has not tried any icing or stretching. No history of previous occurrence. Denies any Achilles pain. Pain is moderate. He is not using any heel inserts.  Past Medical History  Diagnosis Date  . Anxiety state, unspecified 05/28/2010  . MITRAL VALVE PROLAPSE 05/17/2009  . EXTERNAL HEMORRHOIDS 05/17/2009  . 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 DYSPHAGIA UNSPECIFIED 05/28/2010   No past surgical history on file.  reports that he has quit smoking. His smoking use included Cigarettes. He has a .3 pack-year smoking history. He does not have any smokeless tobacco history on file. His alcohol and drug histories are not on file. family history includes Cancer in his father; Diabetes in his father; Hypertension in his father. No Known Allergies   Review of Systems  Neurological: Negative for weakness and numbness.       Objective:   Physical Exam  Constitutional: He appears well-developed and well-nourished.  Cardiovascular: Normal rate and regular rhythm.   Musculoskeletal:  Right foot reveals no edema. No erythema. No ecchymosis. He has some tenderness over the right plantar fascia region. Achilles is intact and nontender.          Assessment & Plan:  Right plantar fasciitis. Would recommend stretching, icing, strengthening exercises given. Consider heel insert and arch supports. We discussed possible steroid injection at this point he wishes to try the above first.

## 2013-11-10 NOTE — Patient Instructions (Signed)
Plantar Fasciitis (Heel Spur Syndrome)  with Rehab  The plantar fascia is a fibrous, ligament-like, soft-tissue structure that spans the bottom of the foot. Plantar fasciitis is a condition that causes pain in the foot due to inflammation of the tissue.  SYMPTOMS   · Pain and tenderness on the underneath side of the foot.  · Pain that worsens with standing or walking.  CAUSES   Plantar fasciitis is caused by irritation and injury to the plantar fascia on the underneath side of the foot. Common mechanisms of injury include:  · Direct trauma to bottom of the foot.  · Damage to a small nerve that runs under the foot where the main fascia attaches to the heel bone.  · Stress placed on the plantar fascia due to bone spurs.  RISK INCREASES WITH:   · Activities that place stress on the plantar fascia (running, jumping, pivoting, or cutting).  · Poor strength and flexibility.  · Improperly fitted shoes.  · Tight calf muscles.  · Flat feet.  · Failure to warm-up properly before activity.  · Obesity.  PREVENTION  · Warm up and stretch properly before activity.  · Allow for adequate recovery between workouts.  · Maintain physical fitness:  · Strength, flexibility, and endurance.  · Cardiovascular fitness.  · Maintain a health body weight.  · Avoid stress on the plantar fascia.  · Wear properly fitted shoes, including arch supports for individuals who have flat feet.  PROGNOSIS   If treated properly, then the symptoms of plantar fasciitis usually resolve without surgery. However, occasionally surgery is necessary.  RELATED COMPLICATIONS   · Recurrent symptoms that may result in a chronic condition.  · Problems of the lower back that are caused by compensating for the injury, such as limping.  · Pain or weakness of the foot during push-off following surgery.  · Chronic inflammation, scarring, and partial or complete fascia tear, occurring more often from repeated injections.  TREATMENT   Treatment initially involves the use of  ice and medication to help reduce pain and inflammation. The use of strengthening and stretching exercises may help reduce pain with activity, especially stretches of the Achilles tendon. These exercises may be performed at home or with a therapist. Your caregiver may recommend that you use heel cups of arch supports to help reduce stress on the plantar fascia. Occasionally, corticosteroid injections are given to reduce inflammation. If symptoms persist for greater than 6 months despite non-surgical (conservative), then surgery may be recommended.   MEDICATION   · If pain medication is necessary, then nonsteroidal anti-inflammatory medications, such as aspirin and ibuprofen, or other minor pain relievers, such as acetaminophen, are often recommended.  · Do not take pain medication within 7 days before surgery.  · Prescription pain relievers may be given if deemed necessary by your caregiver. Use only as directed and only as much as you need.  · Corticosteroid injections may be given by your caregiver. These injections should be reserved for the most serious cases, because they may only be given a certain number of times.  HEAT AND COLD  · Cold treatment (icing) relieves pain and reduces inflammation. Cold treatment should be applied for 10 to 15 minutes every 2 to 3 hours for inflammation and pain and immediately after any activity that aggravates your symptoms. Use ice packs or massage the area with a piece of ice (ice massage).  · Heat treatment may be used prior to performing the stretching and strengthening activities prescribed   by your caregiver, physical therapist, or athletic trainer. Use a heat pack or soak the injury in warm water.  SEEK IMMEDIATE MEDICAL CARE IF:  · Treatment seems to offer no benefit, or the condition worsens.  · Any medications produce adverse side effects.  EXERCISES  RANGE OF MOTION (ROM) AND STRETCHING EXERCISES - Plantar Fasciitis (Heel Spur Syndrome)  These exercises may help you  when beginning to rehabilitate your injury. Your symptoms may resolve with or without further involvement from your physician, physical therapist or athletic trainer. While completing these exercises, remember:   · Restoring tissue flexibility helps normal motion to return to the joints. This allows healthier, less painful movement and activity.  · An effective stretch should be held for at least 30 seconds.  · A stretch should never be painful. You should only feel a gentle lengthening or release in the stretched tissue.  RANGE OF MOTION - Toe Extension, Flexion  · Sit with your right / left leg crossed over your opposite knee.  · Grasp your toes and gently pull them back toward the top of your foot. You should feel a stretch on the bottom of your toes and/or foot.  · Hold this stretch for __________ seconds.  · Now, gently pull your toes toward the bottom of your foot. You should feel a stretch on the top of your toes and or foot.  · Hold this stretch for __________ seconds.  Repeat __________ times. Complete this stretch __________ times per day.   RANGE OF MOTION - Ankle Dorsiflexion, Active Assisted  · Remove shoes and sit on a chair that is preferably not on a carpeted surface.  · Place right / left foot under knee. Extend your opposite leg for support.  · Keeping your heel down, slide your right / left foot back toward the chair until you feel a stretch at your ankle or calf. If you do not feel a stretch, slide your bottom forward to the edge of the chair, while still keeping your heel down.  · Hold this stretch for __________ seconds.  Repeat __________ times. Complete this stretch __________ times per day.   STRETCH  Gastroc, Standing  · Place hands on wall.  · Extend right / left leg, keeping the front knee somewhat bent.  · Slightly point your toes inward on your back foot.  · Keeping your right / left heel on the floor and your knee straight, shift your weight toward the wall, not allowing your back to  arch.  · You should feel a gentle stretch in the right / left calf. Hold this position for __________ seconds.  Repeat __________ times. Complete this stretch __________ times per day.  STRETCH  Soleus, Standing  · Place hands on wall.  · Extend right / left leg, keeping the other knee somewhat bent.  · Slightly point your toes inward on your back foot.  · Keep your right / left heel on the floor, bend your back knee, and slightly shift your weight over the back leg so that you feel a gentle stretch deep in your back calf.  · Hold this position for __________ seconds.  Repeat __________ times. Complete this stretch __________ times per day.  STRETCH  Gastrocsoleus, Standing   Note: This exercise can place a lot of stress on your foot and ankle. Please complete this exercise only if specifically instructed by your caregiver.   · Place the ball of your right / left foot on a step, keeping   your other foot firmly on the same step.  · Hold on to the wall or a rail for balance.  · Slowly lift your other foot, allowing your body weight to press your heel down over the edge of the step.  · You should feel a stretch in your right / left calf.  · Hold this position for __________ seconds.  · Repeat this exercise with a slight bend in your right / left knee.  Repeat __________ times. Complete this stretch __________ times per day.   STRENGTHENING EXERCISES - Plantar Fasciitis (Heel Spur Syndrome)   These exercises may help you when beginning to rehabilitate your injury. They may resolve your symptoms with or without further involvement from your physician, physical therapist or athletic trainer. While completing these exercises, remember:   · Muscles can gain both the endurance and the strength needed for everyday activities through controlled exercises.  · Complete these exercises as instructed by your physician, physical therapist or athletic trainer. Progress the resistance and repetitions only as guided.  STRENGTH - Towel  Curls  · Sit in a chair positioned on a non-carpeted surface.  · Place your foot on a towel, keeping your heel on the floor.  · Pull the towel toward your heel by only curling your toes. Keep your heel on the floor.  · If instructed by your physician, physical therapist or athletic trainer, add ____________________ at the end of the towel.  Repeat __________ times. Complete this exercise __________ times per day.  STRENGTH - Ankle Inversion  · Secure one end of a rubber exercise band/tubing to a fixed object (table, pole). Loop the other end around your foot just before your toes.  · Place your fists between your knees. This will focus your strengthening at your ankle.  · Slowly, pull your big toe up and in, making sure the band/tubing is positioned to resist the entire motion.  · Hold this position for __________ seconds.  · Have your muscles resist the band/tubing as it slowly pulls your foot back to the starting position.  Repeat __________ times. Complete this exercises __________ times per day.   Document Released: 08/18/2005 Document Revised: 11/10/2011 Document Reviewed: 11/30/2008  [employer]® Patient Information ©2014 [employer].

## 2014-01-24 ENCOUNTER — Telehealth: Payer: Self-pay | Admitting: Family Medicine

## 2014-01-24 NOTE — Telephone Encounter (Signed)
Pt is calling requesting PA for cialis 20 mg [id number]

## 2014-01-25 NOTE — Telephone Encounter (Signed)
I received a denial for Cialis 5 mg, quantity 3.  Pt must have a history of failure to 4 weeks of, or intolerance to an alphs-adrenergic blocking medication. Cardura (doxazosin), Flomax (tamsulosin), Hytrin (terazosin), Rapaflo (silodosin), or Uroxatral (alfuzosin).    Cialis 20 and 10 mg are a PLAN EXCLUSION.

## 2014-01-25 NOTE — Telephone Encounter (Signed)
Pt called requesting PA for Cialis 20 mg.  [facility] states Cialis 20 mg and 10 mg are a PLAN EXCLUSION.  I was able to submit a PA for Cialis 5 mg, quantity of 3 per 31 days.  It is currently pending clinical review.  Reference number is [id number].

## 2014-08-04 ENCOUNTER — Ambulatory Visit: Payer: BC Managed Care – PPO

## 2014-08-04 ENCOUNTER — Ambulatory Visit (INDEPENDENT_AMBULATORY_CARE_PROVIDER_SITE_OTHER): Payer: BC Managed Care – PPO | Admitting: *Deleted

## 2014-08-04 DIAGNOSIS — Z23 Encounter for immunization: Secondary | ICD-10-CM

## 2015-05-18 ENCOUNTER — Encounter: Payer: Self-pay | Admitting: Internal Medicine

## 2015-05-18 ENCOUNTER — Ambulatory Visit (INDEPENDENT_AMBULATORY_CARE_PROVIDER_SITE_OTHER): Payer: 59 | Admitting: Internal Medicine

## 2015-05-18 VITALS — BP 138/90 | HR 79 | Temp 97.8°F | Resp 20 | Ht 73.25 in | Wt 220.0 lb

## 2015-05-18 DIAGNOSIS — H00016 Hordeolum externum left eye, unspecified eyelid: Secondary | ICD-10-CM | POA: Diagnosis not present

## 2015-05-18 DIAGNOSIS — H109 Unspecified conjunctivitis: Secondary | ICD-10-CM

## 2015-05-18 DIAGNOSIS — Z23 Encounter for immunization: Secondary | ICD-10-CM

## 2015-05-18 MED ORDER — NEOMYCIN-POLYMYXIN-HC 3.5-10000-1 OP SUSP: 2.0000 [drp] | OPHTHALMIC | Status: DC

## 2015-05-18 NOTE — Progress Notes (Signed)
Subjective:    Patient ID: Terry Johns, male    DOB: February 03, 1956, 59 y.o.   MRN: [id number]  HPI  59 year old patient who presents with a chief complaint of redness and irritation involving the left eye.  He has developed a crusty exudate.  It is most prevalent in the morning.  No eye pain or change in visual acuity.  He also has developed a small painful nodule involving the lower left lid  Past Medical History  Diagnosis Date  . Anxiety state, unspecified 05/28/2010  . MITRAL VALVE PROLAPSE 05/17/2009  . EXTERNAL HEMORRHOIDS 05/17/2009  . 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 DYSPHAGIA UNSPECIFIED 05/28/2010    Social History   Social History  . Marital Status: Married    Spouse Name: N/A  . Number of Children: N/A  . Years of Education: N/A   Occupational History  . Not on file.   Social History Main Topics  . Smoking status: Former Smoker -- 0.10 packs/day for 3 years    Types: Cigarettes  . Smokeless tobacco: Not on file  . Alcohol Use: Not on file  . Drug Use: Not on file  . Sexual Activity: Not on file   Other Topics Concern  . Not on file   Social History Narrative    No past surgical history on file.  Family History  Problem Relation Age of Onset  . Diabetes Father   . Hypertension Father   . Cancer Father     lung, basal cell, melanoma    No Known Allergies  No current outpatient prescriptions on file prior to visit.   No current facility-administered medications on file prior to visit.    BP 138/90 mmHg  Pulse 79  Temp(Src) 97.8 F (36.6 C) (Oral)  Resp 20  Ht 6' 1.25" (1.861 m)  Wt 220 lb (99.791 kg)  BMI 28.81 kg/m2  SpO2 95%     Review of Systems  Constitutional: Negative for fever, chills, appetite change and fatigue.  HENT: Negative for congestion, dental problem, ear pain, hearing loss, sore throat, tinnitus, trouble swallowing and voice change.   Eyes: Positive for discharge, redness and itching. Negative for pain and  visual disturbance.  Respiratory: Negative for cough, chest tightness, wheezing and stridor.   Cardiovascular: Negative for chest pain, palpitations and leg swelling.  Gastrointestinal: Negative for nausea, vomiting, abdominal pain, diarrhea, constipation, blood in stool and abdominal distention.  Genitourinary: Negative for urgency, hematuria, flank pain, discharge, difficulty urinating and genital sores.  Musculoskeletal: Negative for myalgias, back pain, joint swelling, arthralgias, gait problem and neck stiffness.  Skin: Negative for rash.  Neurological: Negative for dizziness, syncope, speech difficulty, weakness, numbness and headaches.  Hematological: Negative for adenopathy. Does not bruise/bleed easily.  Psychiatric/Behavioral: Negative for behavioral problems and dysphoric mood. The patient is not nervous/anxious.        Objective:   Physical Exam  Constitutional: He is oriented to person, place, and time. He appears well-developed.  HENT:  Head: Normocephalic.  Right Ear: External ear normal.  Left Ear: External ear normal.  Eyes: EOM are normal.  The right eye was normal Left conjunctival hyperemia A small stye was present involving the left lateral lower lid  Neck: Normal range of motion.  Musculoskeletal: He exhibits no edema or tenderness.  Neurological: He is alert and oriented to person, place, and time.  Psychiatric: He has a normal mood and affect. His behavior is normal.          Assessment &  Plan:  Conjunctivitis/stye, left eye.  Will treat with topical antibiotic drops as well as warm compresses

## 2015-05-18 NOTE — Progress Notes (Signed)
Pre visit review using our clinic review tool, if applicable. No additional management support is needed unless otherwise documented below in the visit note. 

## 2015-05-18 NOTE — Patient Instructions (Signed)
Conjunctivitis Conjunctivitis is commonly called "pink eye." Conjunctivitis can be caused by bacterial or viral infection, allergies, or injuries. There is usually redness of the lining of the eye, itching, discomfort, and sometimes discharge. There may be deposits of matter along the eyelids. A viral infection usually causes a watery discharge, while a bacterial infection causes a yellowish, thick discharge. Pink eye is very contagious and spreads by direct contact. You may be given antibiotic eyedrops as part of your treatment. Before using your eye medicine, remove all drainage from the eye by washing gently with warm water and cotton balls. Continue to use the medication until you have awakened 2 mornings in a row without discharge from the eye. Do not rub your eye. This increases the irritation and helps spread infection. Use separate towels from other household members. Wash your hands with soap and water before and after touching your eyes. Use cold compresses to reduce pain and sunglasses to relieve irritation from light. Do not wear contact lenses or wear eye makeup until the infection is gone. SEEK MEDICAL CARE IF:   Your symptoms are not better after 3 days of treatment.  You have increased pain or trouble seeing.  The outer eyelids become very red or swollen. Document Released: 09/25/2004 Document Revised: 11/10/2011 Document Reviewed: 08/18/2005 [facility] Patient Information 2015 [place], [place]. This information is not intended to replace advice given to you by your health care provider. Make sure you discuss any questions you have with your health care provider. Sty A sty (hordeolum) is an infection of a gland in the eyelid located at the base of the eyelash. A sty may develop a white or yellow head of pus. It can be puffy (swollen). Usually, the sty will burst and pus will come out on its own. They do not leave lumps in the eyelid once they drain. A sty is often confused with another  form of cyst of the eyelid called a chalazion. Chalazions occur within the eyelid and not on the edge where the bases of the eyelashes are. They often are red, sore and then form firm lumps in the eyelid. CAUSES   Germs (bacteria).  Lasting (chronic) eyelid inflammation. SYMPTOMS   Tenderness, redness and swelling along the edge of the eyelid at the base of the eyelashes.  Sometimes, there is a white or yellow head of pus. It may or may not drain. DIAGNOSIS  An ophthalmologist will be able to distinguish between a sty and a chalazion and treat the condition appropriately.  TREATMENT   Styes are typically treated with warm packs (compresses) until drainage occurs.  In rare cases, medicines that kill germs (antibiotics) may be prescribed. These antibiotics may be in the form of drops, cream or pills.  If a hard lump has formed, it is generally necessary to do a small incision and remove the hardened contents of the cyst in a minor surgical procedure done in the office.  In suspicious cases, your caregiver may send the contents of the cyst to the lab to be certain that it is not a rare, but dangerous form of cancer of the glands of the eyelid. HOME CARE INSTRUCTIONS   Wash your hands often and dry them with a clean towel. Avoid touching your eyelid. This may spread the infection to other parts of the eye.  Apply heat to your eyelid for 10 to 20 minutes, several times a day, to ease pain and help to heal it faster.  Do not squeeze the sty. Allow  it to drain on its own. Wash your eyelid carefully 3 to 4 times per day to remove any pus. SEEK IMMEDIATE MEDICAL CARE IF:   Your eye becomes painful or puffy (swollen).  Your vision changes.  Your sty does not drain by itself within 3 days.  Your sty comes back within a short period of time, even with treatment.  You have redness (inflammation) around the eye.  You have a fever. Document Released: 05/28/2005 Document Revised: 11/10/2011  Document Reviewed: 12/02/2013 [facility] Patient Information 2015 [place], [place]. This information is not intended to replace advice given to you by your health care provider. Make sure you discuss any questions you have with your health care provider.

## 2015-09-02 HISTORY — PX: SHOULDER ARTHROSCOPY WITH ROTATOR CUFF REPAIR: SHX5685

## 2016-03-26 LAB — BASIC METABOLIC PANEL
BUN: 17 mg/dL (ref 4–21)
Creatinine: 0.9 mg/dL (ref 0.6–1.3)
Glucose: 100 mg/dL

## 2016-03-26 LAB — PSA: PSA: 1.15

## 2016-03-26 LAB — HEMOGLOBIN A1C: Hemoglobin A1C: 6.2

## 2016-03-26 LAB — HEPATIC FUNCTION PANEL
ALT: 12 U/L (ref 10–40)
AST: 9 U/L — AB (ref 14–40)

## 2016-03-26 LAB — LIPID PANEL
Cholesterol: 199 mg/dL (ref 0–200)
HDL: 56 mg/dL (ref 35–70)
LDL Cholesterol: 107 mg/dL
Triglycerides: 179 mg/dL — AB (ref 40–160)

## 2016-05-13 ENCOUNTER — Telehealth: Payer: Self-pay | Admitting: Family Medicine

## 2016-05-13 NOTE — Telephone Encounter (Signed)
I spoke with patient and he will drop off report from insurance for [place] to review.

## 2016-05-13 NOTE — Telephone Encounter (Signed)
Pt would like to speak with Dr. Elease Hashimoto concerning a Echocardiogram that was done in 2014.  Pt state that he has applied for life insurance and they have come back with a higher price for the insurance and just wanted to know what would be on the Echo report to make the insurance company give him a higher rate.

## 2016-05-13 NOTE — Telephone Encounter (Signed)
It was noted that pt had "No major abnormalities with trace mitral valve regurgitation" Please advise.

## 2016-07-04 ENCOUNTER — Telehealth: Payer: Self-pay | Admitting: Family Medicine

## 2016-07-04 NOTE — Telephone Encounter (Signed)
Pt is aware he last saw md 2015 and would like a cough suppressannt send to [facility].

## 2016-07-07 NOTE — Telephone Encounter (Signed)
Needs an appt

## 2016-07-07 NOTE — Telephone Encounter (Signed)
Pt brought otc cough med and will callback if need

## 2016-07-10 ENCOUNTER — Telehealth: Payer: Self-pay | Admitting: Family Medicine

## 2016-07-10 NOTE — Telephone Encounter (Signed)
° °  Pt said he left some paperwork with Dr Elease Hashimoto and he has some question and would like a call back    [phone]

## 2016-07-17 NOTE — Telephone Encounter (Signed)
I have not see any paper work on this patient. Do you recall any?

## 2016-07-18 NOTE — Telephone Encounter (Signed)
I will call him.

## 2016-07-22 ENCOUNTER — Ambulatory Visit (INDEPENDENT_AMBULATORY_CARE_PROVIDER_SITE_OTHER): Payer: BLUE CROSS/BLUE SHIELD | Admitting: Family Medicine

## 2016-07-22 ENCOUNTER — Encounter: Payer: Self-pay | Admitting: Family Medicine

## 2016-07-22 VITALS — BP 140/70 | HR 55 | Temp 98.5°F | Ht 73.25 in | Wt 224.0 lb

## 2016-07-22 DIAGNOSIS — Q796 Ehlers-Danlos syndrome, unspecified: Secondary | ICD-10-CM

## 2016-07-22 DIAGNOSIS — R05 Cough: Secondary | ICD-10-CM

## 2016-07-22 DIAGNOSIS — I059 Rheumatic mitral valve disease, unspecified: Secondary | ICD-10-CM

## 2016-07-22 DIAGNOSIS — R059 Cough, unspecified: Secondary | ICD-10-CM

## 2016-07-22 NOTE — Progress Notes (Signed)
Subjective:     Patient ID: Terry Johns, male   DOB: [birth year], 60 y.o.   MRN: [id number]  HPI Patient is here today to discuss issues regarding recent life insurance. He has history of Ehlers-Danlos syndrome and mitral valve prolapse. When he went for his life insurance application they apparently reviewed data from previous echocardiogram 2014 which showed mild LVH and reported history of PVCs. He has no history of hypertension.  Back in 2014 he was out in [place] visiting friends and one of his friends had some type of new technology that performed EKG. He reportedly had PACs and PVCs but we never saw any readings to confirm this. He had recent EKG strip which we did have available for review from recent life insurance physical and this did not show any evidence for PACs or PVCs. Also, he had to prior EKGs on chart including 01/29/2011 and 01/30/2011 which showed normal sinus rhythm with no evidence for PVCs. Basically, patient has not had confirmation of history of PACs or PVCs by any tracings we have reviewed.  He has not had any recent palpitations or chest pains. No activity intolerance. No progressive dyspnea.  Echocardiogram 2014 showed mild LVH with normal systolic function. He had evidence for bileaflet prolapse of the mitral valve with only mild regurgitation. Left atrium was moderately dilated. He's had no follow-up echo since then.  Past Medical History:  Diagnosis Date  . Anxiety state, unspecified 05/28/2010  . DYSPHAGIA UNSPECIFIED 05/28/2010  . 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 EXTERNAL HEMORRHOIDS 05/17/2009  . MITRAL VALVE PROLAPSE 05/17/2009   No past surgical history on file.  reports that he has quit smoking. His smoking use included Cigarettes. He has a 0.30 pack-year smoking history. He does not have any smokeless tobacco history on file. His alcohol and drug histories are not on file. family history includes Cancer in his father; Diabetes in his father;  Hypertension in his father. No Known Allergies   Review of Systems  Constitutional: Negative for fatigue.  Eyes: Negative for visual disturbance.  Respiratory: Negative for cough, chest tightness and shortness of breath.   Cardiovascular: Negative for chest pain, palpitations and leg swelling.  Neurological: Negative for dizziness, seizures, syncope, weakness, light-headedness and headaches.       Objective:   Physical Exam  Constitutional: He is oriented to person, place, and time. He appears well-developed and well-nourished.  HENT:  Right Ear: External ear normal.  Left Ear: External ear normal.  Mouth/Throat: Oropharynx is clear and moist.  Eyes: Pupils are equal, round, and reactive to light.  Neck: Neck supple. No thyromegaly present.  Cardiovascular: Normal rate and regular rhythm.  Exam reveals no gallop.   Murmur heard. Patient has soft murmur over the mitral valve area  Pulmonary/Chest: Effort normal and breath sounds normal. No respiratory distress. He has no wheezes. He has no rales.  Musculoskeletal: He exhibits no edema.  Neurological: He is alert and oriented to person, place, and time.       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    [name] MD [facility]

## 2016-07-22 NOTE — Patient Instructions (Signed)
We will set up repeat Echocardiogram and be in touch.

## 2016-07-22 NOTE — Progress Notes (Signed)
Pre visit review using our clinic review tool, if applicable. No additional management support is needed unless otherwise documented below in the visit note. 

## 2016-07-23 ENCOUNTER — Encounter: Payer: Self-pay | Admitting: Gastroenterology

## 2016-07-31 ENCOUNTER — Encounter: Payer: Self-pay | Admitting: Family Medicine

## 2016-09-02 ENCOUNTER — Encounter: Payer: Self-pay | Admitting: Gastroenterology

## 2016-10-14 ENCOUNTER — Telehealth: Payer: Self-pay | Admitting: Family Medicine

## 2016-10-14 DIAGNOSIS — I341 Nonrheumatic mitral (valve) prolapse: Secondary | ICD-10-CM

## 2016-10-14 NOTE — Telephone Encounter (Signed)
PT needs a referral to see cardiologist due to MVP asap

## 2016-10-14 NOTE — Telephone Encounter (Signed)
Order entered for referral.  

## 2016-10-14 NOTE — Telephone Encounter (Signed)
OK.  He has been seen previously- but several years ago.

## 2016-10-14 NOTE — Telephone Encounter (Signed)
Pt was last seen on 07/22/2016. Okay for referral?

## 2016-10-16 ENCOUNTER — Ambulatory Visit (INDEPENDENT_AMBULATORY_CARE_PROVIDER_SITE_OTHER): Payer: BLUE CROSS/BLUE SHIELD | Admitting: Physician Assistant

## 2016-10-16 ENCOUNTER — Encounter (INDEPENDENT_AMBULATORY_CARE_PROVIDER_SITE_OTHER): Payer: Self-pay

## 2016-10-16 ENCOUNTER — Encounter: Payer: Self-pay | Admitting: Physician Assistant

## 2016-10-16 VITALS — BP 122/76 | HR 77 | Ht 74.5 in | Wt 218.4 lb

## 2016-10-16 DIAGNOSIS — I341 Nonrheumatic mitral (valve) prolapse: Secondary | ICD-10-CM | POA: Diagnosis not present

## 2016-10-16 DIAGNOSIS — Q796 Ehlers-Danlos syndrome, unspecified: Secondary | ICD-10-CM

## 2016-10-16 DIAGNOSIS — R42 Dizziness and giddiness: Secondary | ICD-10-CM | POA: Diagnosis not present

## 2016-10-16 NOTE — Patient Instructions (Addendum)
Your physician recommends that you continue on your current medications as directed. Please refer to the Current Medication list given to you today.   Your physician has requested that you have an echocardiogram. Echocardiography is a painless test that uses sound waves to create images of your heart. It provides your doctor with information about the size and shape of your heart and how well your heart's chambers and valves are working. This procedure takes approximately one hour. There are no restrictions for this procedure.  Your physician has recommended that you wear a holter monitor. Holter monitors are medical devices that record the heart's electrical activity. Doctors most often use these monitors to diagnose arrhythmias. Arrhythmias are problems with the speed or rhythm of the heartbeat. The monitor is a small, portable device. You can wear one while you do your normal daily activities. This is usually used to diagnose what is causing palpitations/syncope (passing out).  [place] physician recommends that you schedule a follow-up appointment in: [place]

## 2016-10-16 NOTE — Progress Notes (Signed)
With Dr. Luther Parody    Cardiology Office Note    Date:  10/16/2016   ID:  Terry Johns, DOB 30-Nov-1955, MRN [id number]  PCP:  [name], MD  Cardiologist: new Dr. [name]  Chief Complaint  Patient presents with  . Shortness of Breath    History of Present Illness:  Terry Johns is a 61 y.o. male with history of Ehlers Danlos syndrome, MVP on echo 2014, Normal stress echo 2012, his trip PACs and PVCs.  Patient was driving through a parking lot in November and felt his eyes flutter associated with dizziness that lasted only a second. He pulled over and the symptoms resolved and he continued driving. He had no palpitations, chest pain or any other symptoms associated with this. He has never had this happen before or since. He works in [employer] and has not been as active as he likes to be. He has no history of chest pain, dyspnea, or syncope. He occasionally gets short of breath if he goes up a flight of stairs but not all of time. No history of hypertension, diabetes mellitus, hyperlipidemia, or family history of heart disease. He smoked briefly when he was 61 years old.     Past Medical History:  Diagnosis Date  . Anxiety state, unspecified 05/28/2010  . DYSPHAGIA UNSPECIFIED 05/28/2010  . 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 EXTERNAL HEMORRHOIDS 05/17/2009  . MITRAL VALVE PROLAPSE 05/17/2009    No past surgical history on file.  Current Medications: No outpatient prescriptions prior to visit.   No facility-administered medications prior to visit.      Allergies:   Patient has no known allergies.   Social History   Social History  . Marital status: Married    Spouse name: N/A  . Number of children: N/A  . Years of education: N/A   Social History Main Topics  . Smoking status: Former Smoker    Packs/day: 0.10    Years: 3.00    Types: Cigarettes  . Smokeless tobacco: Never Used  . Alcohol use None  . Drug use: Unknown  . Sexual activity: Not Asked   Other  Topics Concern  . None   Social History Narrative  . None     Family History:  The patient's   family history includes Cancer in his father; Diabetes in his father; Hypertension in his father.   ROS:   Please see the history of present illness.    Review of Systems  Constitution: Negative.  HENT: Negative.   Eyes: Positive for visual disturbance.  Cardiovascular: Positive for dyspnea on exertion.  Respiratory: Negative.   Endocrine: Negative.   Hematologic/Lymphatic: Negative.   Musculoskeletal: Negative.   Gastrointestinal: Negative.   Genitourinary: Negative.   Neurological: Negative.    All other systems reviewed and are negative.   PHYSICAL EXAM:   VS:  BP 122/76 (BP Location: Right Arm)   Pulse 77   Ht 6' 2.5" (1.892 m)   Wt 218 lb 6.4 oz (99.1 kg)   BMI 27.67 kg/m   Physical Exam  GEN: Well nourished, well developed, in no acute distress  Neck: no JVD, carotid bruits, or masses Cardiac:RRR; Loud 3 to 4/6 systolic murmur at the apex and heard throughout the pericardium Respiratory:  clear to auscultation bilaterally, normal work of breathing GI: soft, nontender, nondistended, + BS Ext: without cyanosis, clubbing, or edema, Good distal pulses bilaterally Psych: euthymic mood, full affect  Wt Readings from Last 3 Encounters:  10/16/16 218 lb 6.4 oz (99.1  kg)  07/22/16 224 lb (101.6 kg)  05/18/15 220 lb (99.8 kg)      Studies/Labs Reviewed:   EKG:  EKG is  ordered today.  The ekg ordered today demonstrates  NSR with PAC and PVC  Recent Labs: 03/26/2016: ALT 12; BUN 17; Creatinine 0.9   Lipid Panel    Component Value Date/Time   CHOL 199 03/26/2016   TRIG 179 (A) 03/26/2016   HDL 56 03/26/2016   LDLCALC 107 03/26/2016    Additional studies/ records that were reviewed today include:  2Decho 2014 Study Conclusions  - Left ventricle: The cavity size was normal. Wall thickness   was increased in a pattern of mild LVH. Systolic function   was normal.  The estimated ejection fraction was in the   range of 55% to 60%. - Mitral valve: Bileaflet prolapse with mild thickening Mild   regurgitation. - Left atrium: The atrium was moderately dilated. - Atrial septum: No defect or patent foramen ovale was   identified.     ASSESSMENT:    1. MVP (mitral valve prolapse)   2. Dizziness   3. Ehlers-Danlos syndrome      PLAN:  In order of problems listed above:  Mitral valve prolapse with significant systolic murmur suspect MR. We'll order 2-D echo to evaluate further.F/U with Dr. [name].  Dizziness with presyncope back in November. No associated symptoms. Does have PACs and PVCs on EKG. Will place 72 hour monitor to rule out arrhythmia. May need a stress test at some point but has no history of chest pain or cardiac risk factors. Await echo results.  Ehlers-Danlos syndrome he does not know which type he has but would like to. Recommend he see rheumatology.     Medication Adjustments/Labs and Tests Ordered: Current medicines are reviewed at length with the patient today.  Concerns regarding medicines are outlined above.  Medication changes, Labs and Tests ordered today are listed in the Patient Instructions below. Patient Instructions  Your physician recommends that you continue on your current medications as directed. Please refer to the Current Medication list given to you today.   Your physician has requested that you have an echocardiogram. Echocardiography is a painless test that uses sound waves to create images of your heart. It provides your doctor with information about the size and shape of your heart and how well your heart's chambers and valves are working. This procedure takes approximately one hour. There are no restrictions for this procedure.  Your physician has recommended that you wear a holter monitor. Holter monitors are medical devices that record the heart's electrical activity. Doctors most often use these monitors  to diagnose arrhythmias. Arrhythmias are problems with the speed or rhythm of the heartbeat. The monitor is a small, portable device. You can wear one while you do your normal daily activities. This is usually used to diagnose what is causing palpitations/syncope (passing out).  [place] recommends that you schedule a follow-up appointment in: [place], Ermalinda Barrios, PA-C  10/16/2016 11:59 AM    [facility] [place], [place], [place]  [address] Phone: [phone]; Fax: [phone]

## 2016-10-17 ENCOUNTER — Ambulatory Visit (AMBULATORY_SURGERY_CENTER): Payer: Self-pay | Admitting: *Deleted

## 2016-10-17 ENCOUNTER — Encounter: Payer: Self-pay | Admitting: Gastroenterology

## 2016-10-17 ENCOUNTER — Telehealth: Payer: Self-pay | Admitting: *Deleted

## 2016-10-17 VITALS — Ht 74.0 in | Wt 219.4 lb

## 2016-10-17 DIAGNOSIS — Z1211 Encounter for screening for malignant neoplasm of colon: Secondary | ICD-10-CM

## 2016-10-17 MED ORDER — NA SULFATE-K SULFATE-MG SULF 17.5-3.13-1.6 GM/177ML PO SOLN: ORAL | 0 refills | Status: DC

## 2016-10-17 NOTE — Progress Notes (Signed)
No egg or soy allergy  No trouble moving neck  No diet medications taken  No home oxygen used or hx of sleep apnea  Registered in [place] for [place] given

## 2016-10-17 NOTE — Telephone Encounter (Signed)
Sounds like he is having some shortness of breath which is prompting Echo. This should be done prior to his colonoscopy. He can either reschedule Echo to be done prior to scheduled colonoscopy, or reschedule his colonoscopy after the echo, either way we should obtain the Echo result first prior to colonoscopy. Thanks for letting me know

## 2016-10-17 NOTE — Telephone Encounter (Signed)
LMOM re: need to either reschedule ECHO to before colonoscopy or move colonoscopy until after 11-03-16.

## 2016-10-17 NOTE — Telephone Encounter (Signed)
Spoke with pt and new appt made for 12-11-16 at 8:30 a.m. New instructions mailed to pt

## 2016-10-17 NOTE — Telephone Encounter (Signed)
Dr. Havery Moros and Jenny Reichmann,  Would you please review this pt's OV from 10-16-16?  He is going to have an ECHO on 11-03-16 and his colonoscopy is on 10-31-16.  His last ECHO in 2014 was fine.  Is he ok to proceed or would you like him to wait until after his scheduled ECHO?  Thanks, [employer]

## 2016-10-31 ENCOUNTER — Encounter: Payer: BLUE CROSS/BLUE SHIELD | Admitting: Gastroenterology

## 2016-11-03 ENCOUNTER — Other Ambulatory Visit: Payer: Self-pay

## 2016-11-03 ENCOUNTER — Ambulatory Visit (HOSPITAL_COMMUNITY): Payer: BLUE CROSS/BLUE SHIELD | Attending: Cardiovascular Disease

## 2016-11-03 ENCOUNTER — Ambulatory Visit (INDEPENDENT_AMBULATORY_CARE_PROVIDER_SITE_OTHER): Payer: BLUE CROSS/BLUE SHIELD

## 2016-11-03 DIAGNOSIS — I341 Nonrheumatic mitral (valve) prolapse: Secondary | ICD-10-CM | POA: Diagnosis not present

## 2016-11-03 DIAGNOSIS — R42 Dizziness and giddiness: Secondary | ICD-10-CM | POA: Diagnosis present

## 2016-11-03 DIAGNOSIS — Q796 Ehlers-Danlos syndrome: Secondary | ICD-10-CM | POA: Diagnosis not present

## 2016-11-03 DIAGNOSIS — R002 Palpitations: Secondary | ICD-10-CM | POA: Diagnosis present

## 2016-11-03 DIAGNOSIS — Z87891 Personal history of nicotine dependence: Secondary | ICD-10-CM | POA: Insufficient documentation

## 2016-11-05 ENCOUNTER — Encounter (INDEPENDENT_AMBULATORY_CARE_PROVIDER_SITE_OTHER): Payer: Self-pay

## 2016-11-06 ENCOUNTER — Encounter: Payer: Self-pay | Admitting: Cardiology

## 2016-11-13 ENCOUNTER — Encounter: Payer: Self-pay | Admitting: Cardiology

## 2016-11-13 ENCOUNTER — Ambulatory Visit (INDEPENDENT_AMBULATORY_CARE_PROVIDER_SITE_OTHER): Payer: BLUE CROSS/BLUE SHIELD | Admitting: Cardiology

## 2016-11-13 VITALS — BP 140/86 | HR 54 | Ht 74.0 in | Wt 221.2 lb

## 2016-11-13 DIAGNOSIS — I493 Ventricular premature depolarization: Secondary | ICD-10-CM

## 2016-11-13 DIAGNOSIS — I340 Nonrheumatic mitral (valve) insufficiency: Secondary | ICD-10-CM

## 2016-11-13 DIAGNOSIS — Q796 Ehlers-Danlos syndrome, unspecified: Secondary | ICD-10-CM

## 2016-11-13 NOTE — Patient Instructions (Signed)
Medication Instructions:  The current medical regimen is effective;  continue present plan and medications.  You are being referred to electrophysiology for the evaluation of PVCs.  Follow-Up: Follow up as needed with Dr [name].  Thank you for choosing [facility]!!

## 2016-11-13 NOTE — Progress Notes (Signed)
Cardiology Office Note    Date:  11/13/2016   ID:  Terry Johns, DOB Nov 07, 1955, MRN [id number]  PCP:  [name], MD  Cardiologist:   Candee Furbish, MD     History of Present Illness:  Terry Johns is a 61 y.o. male here to discuss recent test results. He was seen by Estella Husk on 10/16/16. Has a history Ehlers Danlos syndrome, mitral valve prolapse, PVCs and PACs.  In review of her note, he was driving through a parking lot in November and felt his eyes flutter and felt dizziness that lasted approximately one second. Eyes rolled back, LOC for a second or 2 he thinks. He pulled over and the symptoms completely resolved and then he continued driving. No chest pain, no other associated symptoms. Never had this happen before. He doesn't exercise as much as he would like, working in [employer]. No syncope. He may get shortness of breath with walking a flight of stairs but not frequently. No extensive smoking history. No other significant cardiac risk factors.  He had a Holter monitor on 11/03/16 which showed 20% of his beats as premature ventricular contractions (approximate 50,000 PVCs out of 258,000 beats). No PACs. He also had an echocardiogram which showed normal ejection fraction but severely dilated left atrium and pseudo-normal diastolic dysfunction, grade 2. Previous echocardiogram did show moderately dilated left atrium.  Back in 2012 he had a stress echocardiogram it showed rare PVCs, I looked through his EKG strips. No ischemia reported on echocardiogram.     Past Medical History:  Diagnosis Date  . Cataract   . DYSPHAGIA UNSPECIFIED 05/28/2010  . Ehlers-Danlos disease   . 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 EXTERNAL HEMORRHOIDS 05/17/2009  . MITRAL VALVE PROLAPSE 05/17/2009    Past Surgical History:  Procedure Laterality Date  . COLONOSCOPY    . SHOULDER SURGERY     left 2017    Current Medications: Outpatient Medications Prior to Visit  Medication Sig  Dispense Refill  . ibuprofen (ADVIL,MOTRIN) 200 MG tablet Take 200 mg by mouth every 6 (six) hours as needed (pain).     . Na Sulfate-K Sulfate-Mg Sulf (SUPREP BOWEL PREP KIT) 17.5-3.13-1.6 GM/180ML SOLN Suprep as directed, no substitutions (Patient not taking: Reported on 11/13/2016) 354 mL 0   No facility-administered medications prior to visit.      Allergies:   Patient has no known allergies.   Social History   Social History  . Marital status: Married    Spouse name: N/A  . Number of children: N/A  . Years of education: N/A   Social History Main Topics  . Smoking status: Former Smoker    Packs/day: 0.10    Years: 3.00    Types: Cigarettes  . Smokeless tobacco: Never Used  . Alcohol use No  . Drug use: No  . Sexual activity: Not Asked   Other Topics Concern  . None   Social History Narrative  . None     Family History:  The patient's family history includes Breast cancer in his mother; Cancer in his father; Diabetes in his father; Hypertension in his father.   ROS:   Please see the history of present illness.    ROS All other systems reviewed and are negative.   PHYSICAL EXAM:   VS:  BP 140/86   Pulse (!) 54   Ht '6\' 2"'  (1.88 m)   Wt 221 lb 3.2 oz (100.3 kg)   SpO2 94%   BMI 28.40 kg/m  GEN: Well nourished, well developed, in no acute distress  HEENT: normal  Neck: no JVD, carotid bruits, or masses Cardiac: RRR with frequent ectopy; mild systolic murmur, no rubs, or gallops,no edema  Respiratory:  clear to auscultation bilaterally, normal work of breathing GI: soft, nontender, nondistended, + BS MS: no deformity or atrophy  Skin: warm and dry, no rash Neuro:  Alert and Oriented x 3, Strength and sensation are intact Psych: euthymic mood, full affect  Wt Readings from Last 3 Encounters:  11/13/16 221 lb 3.2 oz (100.3 kg)  10/17/16 219 lb 6.4 oz (99.5 kg)  10/16/16 218 lb 6.4 oz (99.1 kg)      Studies/Labs Reviewed:   EKG:  No new EKG today.  Previous reviewed PVCs noted  Recent Labs: 03/26/2016: ALT 12; BUN 17; Creatinine 0.9   Lipid Panel    Component Value Date/Time   CHOL 199 03/26/2016   TRIG 179 (A) 03/26/2016   HDL 56 03/26/2016   LDLCALC 107 03/26/2016    Additional studies/ records that were reviewed today include:   Echocardiogram 11/03/16: - Left ventricle: The cavity size was mildly dilated. There was   mild concentric hypertrophy. Systolic function was normal. The   estimated ejection fraction was in the range of 60% to 65%. Wall   motion was normal; there were no regional wall motion   abnormalities. Features are consistent with a pseudonormal left   ventricular filling pattern, with concomitant abnormal relaxation   and increased filling pressure (grade 2 diastolic dysfunction).   Doppler parameters are consistent with indeterminate ventricular   filling pressure. - Aortic valve: Transvalvular velocity was within the normal range.   There was no stenosis. There was no regurgitation. - Mitral valve: Transvalvular velocity was within the normal range.   There was no evidence for stenosis. There was mild regurgitation. - Left atrium: The atrium was severely dilated. - Right ventricle: The cavity size was normal. Wall thickness was   normal. Systolic function was normal. - Atrial septum: No defect or patent foramen ovale was identified   by color flow Doppler. - Tricuspid valve: There was no regurgitation. - Pulmonary arteries: Systolic pressure was within the normal   range. PA peak pressure: 14 mm Hg (S).  48 hr holter:  51,000 total PVCs out of 258,000 beats recorded (20%)  Rare ventricular run, 3 beats, 4 beats.  Sinus rhythm underlying. No atrial fibrillation.   Recommend EP referral given frequency of PVCs and risk for potential cardiomyopathy. Would also start Toprol 25 mg once a day to hopefully help suppress.   ASSESSMENT:    1. PVC's (premature ventricular contractions)   2.  Ehlers-Danlos syndrome   3. Mild mitral regurgitation      PLAN:  In order of problems listed above:  Very frequent PVCs, brief ventricular triplet, brief ventricular run of 4 beats  - Close to 20% of his total heartbeats are PVCs on this Holter monitor, see above for details. EF currently is normal.  - No PACs despite his severely dilated left atrium which may be a manifestation of his Ehlers Danlos syndrome. Mitral regurgitation is only mild.  - Could his episode previously been a ventricular run?   - I discussed with him starting Toprol, beta blocker to help suppress PVCs. He states that he is "anti-medicines "but said this in a nonconfrontational way.  - I will refer him to electrophysiology for discussion of possible ablative procedure or to perhaps discuss further beta blocker use or perhaps  calcium channel blocker use.  - I discussed potential side effects of beta blockers to him.  - I also do not want him to vigorously exercise or high risk activity at this time until he is further evaluated.  Mild mitral regurgitation   - this should not be responsible for his severely dilated left atrium.  Ehlers Danlos syndrome  - Connective tissue disorder possibly related to his current structural heart disease.     Medication Adjustments/Labs and Tests Ordered: Current medicines are reviewed at length with the patient today.  Concerns regarding medicines are outlined above.  Medication changes, Labs and Tests ordered today are listed in the Patient Instructions below. Patient Instructions  Medication Instructions:  The current medical regimen is effective;  continue present plan and medications.  You are being referred to electrophysiology for the evaluation of PVCs.  Follow-Up: Follow up as needed with Dr [name].  Thank you for choosing [facility]!!        Signed, Candee Furbish, MD  11/13/2016 10:27 AM    [address],  [place], [place]  [address] Phone: [phone]; Fax: [phone]

## 2016-12-01 ENCOUNTER — Encounter: Payer: Self-pay | Admitting: *Deleted

## 2016-12-01 ENCOUNTER — Ambulatory Visit (INDEPENDENT_AMBULATORY_CARE_PROVIDER_SITE_OTHER): Payer: BLUE CROSS/BLUE SHIELD | Admitting: Cardiology

## 2016-12-01 ENCOUNTER — Other Ambulatory Visit: Payer: Self-pay | Admitting: Cardiology

## 2016-12-01 ENCOUNTER — Encounter: Payer: Self-pay | Admitting: Cardiology

## 2016-12-01 VITALS — BP 130/66 | HR 80 | Ht 74.0 in | Wt 219.8 lb

## 2016-12-01 DIAGNOSIS — I493 Ventricular premature depolarization: Secondary | ICD-10-CM

## 2016-12-01 DIAGNOSIS — Z01812 Encounter for preprocedural laboratory examination: Secondary | ICD-10-CM

## 2016-12-01 NOTE — Addendum Note (Signed)
Addended by: Stanton Kidney on: 12/01/2016 09:54 AM   Modules accepted: Orders

## 2016-12-01 NOTE — Progress Notes (Signed)
Electrophysiology Office Note   Date:  12/01/2016   ID:  Terry Johns, DOB [birth year], MRN [id number]  PCP:  Terry Post, MD  Cardiologist:  Terry Johns Primary Electrophysiologist:  Terry Haw, MD    No chief complaint on file.    History of Present Illness: Terry Johns is a 61 y.o. male who presents today for electrophysiology evaluation.   Terry Johns is a 61 y.o. male who is being seen today for the evaluation of PVCs at the request of Terry Post, MD. Has a history Ehlers Danlos syndrome, mitral valve prolapse, PVCs and PACs.  In review of her note, he was driving through a parking lot in November and felt his eyes flutter and felt dizziness that lasted approximately one second. Eyes rolled back, LOC for a second or 2 he thinks. He pulled over and the symptoms completely resolved and then he continued driving. No chest pain, no other associated symptoms. Never had this happen before. He doesn't exercise as much as he would like, working in [employer]. No syncope. He may get shortness of breath with walking a flight of stairs but not frequently. No extensive smoking history. No other significant cardiac risk factors.  He had a Holter monitor on 11/03/16 which showed 20% of his beats as premature ventricular contractions (approximate 50,000 PVCs out of 258,000 beats). No PACs. He also had an echocardiogram which showed normal ejection fraction but severely dilated left atrium and pseudo-normal diastolic dysfunction, grade 2. Previous echocardiogram did show moderately dilated left atrium.  Today, he denies symptoms of palpitations, chest pain, shortness of breath, orthopnea, PND, lower extremity edema, claudication, dizziness, presyncope, syncope, bleeding, or neurologic sequela. The patient is tolerating medications without difficulties and is otherwise without complaint today.    Past Medical History:  Diagnosis Date  . Cataract   . DYSPHAGIA UNSPECIFIED 05/28/2010  .  Ehlers-Danlos disease   . 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 EXTERNAL HEMORRHOIDS 05/17/2009  . MITRAL VALVE PROLAPSE 05/17/2009   Past Surgical History:  Procedure Laterality Date  . COLONOSCOPY    . SHOULDER SURGERY     left 2017     Current Outpatient Prescriptions  Medication Sig Dispense Refill  . ibuprofen (ADVIL,MOTRIN) 200 MG tablet Take 200 mg by mouth every 6 (six) hours as needed (pain).     . Na Sulfate-K Sulfate-Mg Sulf (SUPREP BOWEL PREP KIT) 17.5-3.13-1.6 GM/180ML SOLN Suprep as directed, no substitutions (Patient not taking: Reported on 11/13/2016) 354 mL 0   No current facility-administered medications for this visit.     Allergies:   Patient has no known allergies.   Social History:  The patient  reports that he has quit smoking. His smoking use included Cigarettes. He has a 0.30 pack-year smoking history. He has never used smokeless tobacco. He reports that he does not drink alcohol or use drugs.   Family History:  The patient's family history includes Breast cancer in his mother; Cancer in his father; Diabetes in his father; Hypertension in his father.    ROS:  Please see the history of present illness.   Otherwise, review of systems is positive for none.   All other systems are reviewed and negative.    PHYSICAL EXAM: VS:  BP 130/66 (BP Location: Right Arm, Patient Position: Sitting, Cuff Size: Large)   Pulse 80   Ht '6\' 2"'  (1.88 m)   Wt 219 lb 12.8 oz (99.7 kg)   SpO2 94%   BMI 28.22 kg/m  , BMI Body  mass index is 28.22 kg/m. GEN: Well nourished, well developed, in no acute distress  HEENT: normal  Neck: no JVD, carotid bruits, or masses Cardiac: iRRR; 2/6 murmur at the apex, no rubs, or gallops,no edema  Respiratory:  clear to auscultation bilaterally, normal work of breathing GI: soft, nontender, nondistended, + BS MS: no deformity or atrophy  Skin: warm and dry Neuro:  Strength and sensation are intact Psych: euthymic mood, full  affect  EKG:  EKG is not ordered today. Personal review of the ekg ordered shows Sinus rhythm with PVCs with a right bundle branch block morphology superior axis  Recent Labs: 03/26/2016: ALT 12; BUN 17; Creatinine 0.9    Lipid Panel     Component Value Date/Time   CHOL 199 03/26/2016   TRIG 179 (A) 03/26/2016   HDL 56 03/26/2016   LDLCALC 107 03/26/2016     Wt Readings from Last 3 Encounters:  12/01/16 219 lb 12.8 oz (99.7 kg)  11/13/16 221 lb 3.2 oz (100.3 kg)  10/17/16 219 lb 6.4 oz (99.5 kg)      Other studies Reviewed: Additional studies/ records that were reviewed today include:  Echocardiogram 11/03/16: - Left ventricle: The cavity size was mildly dilated. There was mild concentric hypertrophy. Systolic function was normal. The estimated ejection fraction was in the range of 60% to 65%. Wall motion was normal; there were no regional wall motion abnormalities. Features are consistent with a pseudonormal left ventricular filling pattern, with concomitant abnormal relaxation and increased filling pressure (grade 2 diastolic dysfunction). Doppler parameters are consistent with indeterminate ventricular filling pressure. - Aortic valve: Transvalvular velocity was within the normal range. There was no stenosis. There was no regurgitation. - Mitral valve: Transvalvular velocity was within the normal range. There was no evidence for stenosis. There was mild regurgitation. - Left atrium: The atrium was severely dilated. - Right ventricle: The cavity size was normal. Wall thickness was normal. Systolic function was normal. - Atrial septum: No defect or patent foramen ovale was identified by color flow Doppler. - Tricuspid valve: There was no regurgitation. - Pulmonary arteries: Systolic pressure was within the normal range. PA peak pressure: 14 mm Hg (S).  48 hr holter:  51,000 total PVCs out of 258,000 beats recorded (20%)  Rare ventricular  run, 3 beats, 4 beats. Sinus rhythm underlying. No atrial fibrillation.    ASSESSMENT AND PLAN:  1.  PVCs: Roughly 20% of his total beats seen on his Holter monitor. He does not wish to take any medications at this time. I did discuss with him the possibility of ablation. Risks and benefits were discussed. Risks include bleeding, tamponade, heart block, and stroke. He understands these risks and has agreed to the procedure. In review of his EKG, it appears that his PVCs are coming from either the RV or LV septum on the inferior wall. We'll initially planned to map the RV.  2. Mild mitral regurgitation: Asymptomatic. Continue current management.    Current medicines are reviewed at length with the patient today.   The patient does not have concerns regarding his medicines.  The following changes were made today:  none  Labs/ tests ordered today include:  No orders of the defined types were placed in this encounter.    Disposition:   FU with Caeley Dohrmann 3 months  Signed, Natahsa Marian [name], MD  12/01/2016 9:34 AM     [facility] [address] [place] [address] [phone] (office) [phone] (fax)

## 2016-12-01 NOTE — Patient Instructions (Signed)
Medication Instructions:    Your physician recommends that you continue on your current medications as directed. Please refer to the Current Medication list given to you today.  --- If you need a refill on your cardiac medications before your next appointment, please call your pharmacy. ---  Labwork:  Pre procedure labs today: BMET & CBC w/ diff  Testing/Procedures: Your physician has recommended that you have an ablation. Catheter ablation is a medical procedure used to treat some cardiac arrhythmias (irregular heartbeats). During catheter ablation, a long, thin, flexible tube is put into a blood vessel in your groin (upper thigh), or neck. This tube is called an ablation catheter. It is then guided to your heart through the blood vessel. Radio frequency waves destroy small areas of heart tissue where abnormal heartbeats may cause an arrhythmia to start. Please see the instruction sheet given to you today.  Follow-Up:  Your physician recommends that you schedule a follow-up appointment in: 4 weeks with Dr. Curt Bears   Thank you for choosing [facility]!!   Trinidad Curet, RN [phone]  Any Other Special Instructions Will Be Listed Below (If Applicable).   Cardiac Ablation Cardiac ablation is a procedure to disable (ablate) a small amount of heart tissue in very specific places. The heart has many electrical connections. Sometimes these connections are abnormal and can cause the heart to beat very fast or irregularly. Ablating some of the problem areas can improve the heart rhythm or return it to normal. Ablation may be done for people who:  Have Wolff-Parkinson-White syndrome.  Have fast heart rhythms (tachycardia).  Have taken medicines for an abnormal heart rhythm (arrhythmia) that were not effective or caused side effects.  Have a high-risk heartbeat that may be life-threatening. During the procedure, a small incision is made in the neck or the groin, and a long, thin,  flexible tube (catheter) is inserted into the incision and moved to the heart. Small devices (electrodes) on the tip of the catheter will send out electrical currents. A type of X-ray (fluoroscopy) will be used to help guide the catheter and to provide images of the heart. Tell a health care provider about:  Any allergies you have.  All medicines you are taking, including vitamins, herbs, eye drops, creams, and over-the-counter medicines.  Any problems you or family members have had with anesthetic medicines.  Any blood disorders you have.  Any surgeries you have had.  Any medical conditions you have, such as kidney failure.  Whether you are pregnant or may be pregnant. What are the risks? Generally, this is a safe procedure. However, problems may occur, including:  Infection.  Bruising and bleeding at the catheter insertion site.  Bleeding into the chest, especially into the sac that surrounds the heart. This is a serious complication.  Stroke or blood clots.  Damage to other structures or organs.  Allergic reaction to medicines or dyes.  Need for a permanent pacemaker if the normal electrical system is damaged. A pacemaker is a small computer that sends electrical signals to the heart and helps your heart beat normally.  The procedure not being fully effective. This may not be recognized until months later. Repeat ablation procedures are sometimes required. What happens before the procedure?  Follow instructions from your health care provider about eating or drinking restrictions.  Ask your health care provider about:  Changing or stopping your regular medicines. This is especially important if you are taking diabetes medicines or blood thinners.  Taking medicines such as  aspirin and ibuprofen. These medicines can thin your blood. Do not take these medicines before your procedure if your health care provider instructs you not to.  Plan to have someone take you home from  the hospital or clinic.  If you will be going home right after the procedure, plan to have someone with you for 24 hours. What happens during the procedure?  To lower your risk of infection:  Your health care team will wash or sanitize their hands.  Your skin will be washed with soap.  Hair may be removed from the incision area.  An IV tube will be inserted into one of your veins.  You will be given a medicine to help you relax (sedative).  The skin on your neck or groin will be numbed.  An incision will be made in your neck or your groin.  A needle will be inserted through the incision and into a large vein in your neck or groin.  A catheter will be inserted into the needle and moved to your heart.  Dye may be injected through the catheter to help your surgeon see the area of the heart that needs treatment.  Electrical currents will be sent from the catheter to ablate heart tissue in desired areas. There are three types of energy that may be used to ablate heart tissue:  Heat (radiofrequency energy).  Laser energy.  Extreme cold (cryoablation).  When the necessary tissue has been ablated, the catheter will be removed.  Pressure will be held on the catheter insertion area to prevent excessive bleeding.  A bandage (dressing) will be placed over the catheter insertion area. The procedure may vary among health care providers and hospitals. What happens after the procedure?  Your blood pressure, heart rate, breathing rate, and blood oxygen level will be monitored until the medicines you were given have worn off.  Your catheter insertion area will be monitored for bleeding. You will need to lie still for a few hours to ensure that you do not bleed from the catheter insertion area.  Do not drive for 24 hours or as long as directed by your health care provider. Summary  Cardiac ablation is a procedure to disable (ablate) a small amount of heart tissue in very specific  places. Ablating some of the problem areas can improve the heart rhythm or return it to normal.  During the procedure, electrical currents will be sent from the catheter to ablate heart tissue in desired areas. This information is not intended to replace advice given to you by your health care provider. Make sure you discuss any questions you have with your health care provider. Document Released: 01/04/2009 Document Revised: 07/07/2016 Document Reviewed: 07/07/2016 Elsevier Interactive Patient Education  2017 [employer].

## 2016-12-02 LAB — CBC WITH DIFFERENTIAL/PLATELET
Basophils Absolute: 0 10*3/uL (ref 0.0–0.2)
Basos: 1 %
EOS (ABSOLUTE): 0.2 10*3/uL (ref 0.0–0.4)
Eos: 3 %
Hematocrit: 42.8 % (ref 37.5–51.0)
Hemoglobin: 15.2 g/dL (ref 13.0–17.7)
Immature Grans (Abs): 0 10*3/uL (ref 0.0–0.1)
Immature Granulocytes: 0 %
Lymphocytes Absolute: 1.6 10*3/uL (ref 0.7–3.1)
Lymphs: 31 %
MCH: 29.9 pg (ref 26.6–33.0)
MCHC: 35.5 g/dL (ref 31.5–35.7)
MCV: 84 fL (ref 79–97)
Monocytes Absolute: 0.7 10*3/uL (ref 0.1–0.9)
Monocytes: 14 %
Neutrophils Absolute: 2.6 10*3/uL (ref 1.4–7.0)
Neutrophils: 51 %
Platelets: 210 10*3/uL (ref 150–379)
RBC: 5.08 x10E6/uL (ref 4.14–5.80)
RDW: 13.8 % (ref 12.3–15.4)
WBC: 5.1 10*3/uL (ref 3.4–10.8)

## 2016-12-02 LAB — BASIC METABOLIC PANEL
BUN/Creatinine Ratio: 18 (ref 10–24)
BUN: 17 mg/dL (ref 8–27)
CO2: 24 mmol/L (ref 18–29)
Calcium: 9.5 mg/dL (ref 8.6–10.2)
Chloride: 99 mmol/L (ref 96–106)
Creatinine, Ser: 0.96 mg/dL (ref 0.76–1.27)
GFR calc Af Amer: 98 mL/min/{1.73_m2} (ref >59)
GFR calc non Af Amer: 85 mL/min/{1.73_m2} (ref >59)
Glucose: 92 mg/dL (ref 65–99)
Potassium: 4.4 mmol/L (ref 3.5–5.2)
Sodium: 140 mmol/L (ref 134–144)

## 2016-12-08 ENCOUNTER — Ambulatory Visit (HOSPITAL_BASED_OUTPATIENT_CLINIC_OR_DEPARTMENT_OTHER)
Admission: RE | Admit: 2016-12-08 | Discharge: 2016-12-09 | Disposition: A | Discharge status: Home / Self Care | Payer: BLUE CROSS/BLUE SHIELD | Source: Ambulatory Visit | Attending: Cardiology | Admitting: Cardiology

## 2016-12-08 ENCOUNTER — Encounter (HOSPITAL_COMMUNITY): Payer: Self-pay | Admitting: General Practice

## 2016-12-08 ENCOUNTER — Inpatient Hospital Stay (HOSPITAL_COMMUNITY)
Admission: RE | Disposition: A | Discharge status: Home / Self Care | Payer: Self-pay | Source: Ambulatory Visit | Attending: Cardiology

## 2016-12-08 DIAGNOSIS — Y838 Other surgical procedures as the cause of abnormal reaction of the patient, or of later complication, without mention of misadventure at the time of the procedure: Secondary | ICD-10-CM | POA: Insufficient documentation

## 2016-12-08 DIAGNOSIS — I493 Ventricular premature depolarization: Secondary | ICD-10-CM | POA: Diagnosis not present

## 2016-12-08 DIAGNOSIS — I97638 Postprocedural hematoma of a circulatory system organ or structure following other circulatory system procedure: Secondary | ICD-10-CM

## 2016-12-08 HISTORY — PX: PVC ABLATION: EP1236

## 2016-12-08 LAB — POCT ACTIVATED CLOTTING TIME
Activated Clotting Time: 263 seconds
Activated Clotting Time: 235 seconds
Activated Clotting Time: 197 seconds
Activated Clotting Time: 180 seconds

## 2016-12-08 SURGERY — PVC ABLATION

## 2016-12-08 MED ORDER — HEPARIN SODIUM (PORCINE) 1000 UNIT/ML IJ SOLN
INTRAMUSCULAR | Status: AC
  Filled 2016-12-08: qty 2

## 2016-12-08 MED ORDER — BUPIVACAINE HCL (PF) 0.25 % IJ SOLN
INTRAMUSCULAR | Status: AC
  Filled 2016-12-08: qty 30

## 2016-12-08 MED ORDER — MIDAZOLAM HCL 5 MG/5ML IJ SOLN
INTRAMUSCULAR | Status: DC | PRN
  Administered 2016-12-08 (×3): 1 mg via INTRAVENOUS
  Administered 2016-12-08: 2 mg via INTRAVENOUS

## 2016-12-08 MED ORDER — HEPARIN SODIUM (PORCINE) 1000 UNIT/ML IJ SOLN
INTRAMUSCULAR | Status: DC | PRN
  Administered 2016-12-08: 12000 [IU] via INTRAVENOUS
  Administered 2016-12-08: 1000 [IU] via INTRAVENOUS
  Administered 2016-12-08: 3000 [IU] via INTRAVENOUS

## 2016-12-08 MED ORDER — HEPARIN (PORCINE) IN NACL 2-0.9 UNIT/ML-% IJ SOLN
INTRAMUSCULAR | Status: AC
  Filled 2016-12-08: qty 500

## 2016-12-08 MED ORDER — SODIUM CHLORIDE 0.9% FLUSH: 3.0000 mL | INTRAVENOUS | Status: DC | PRN

## 2016-12-08 MED ORDER — MIDAZOLAM HCL 5 MG/5ML IJ SOLN
INTRAMUSCULAR | Status: AC
  Filled 2016-12-08: qty 5

## 2016-12-08 MED ORDER — ONDANSETRON HCL 4 MG/2ML IJ SOLN: 4.0000 mg | Freq: Four times a day (QID) | INTRAMUSCULAR | Status: DC | PRN

## 2016-12-08 MED ORDER — BUPIVACAINE HCL (PF) 0.25 % IJ SOLN
INTRAMUSCULAR | Status: DC | PRN
  Administered 2016-12-08: 50 mL

## 2016-12-08 MED ORDER — HEPARIN (PORCINE) IN NACL 2-0.9 UNIT/ML-% IJ SOLN
INTRAMUSCULAR | Status: DC | PRN
  Administered 2016-12-08: 15:00:00

## 2016-12-08 MED ORDER — HEPARIN SODIUM (PORCINE) 1000 UNIT/ML IJ SOLN
INTRAMUSCULAR | Status: AC
  Filled 2016-12-08: qty 1

## 2016-12-08 MED ORDER — SODIUM CHLORIDE 0.9% FLUSH
3.0000 mL | Freq: Two times a day (BID) | INTRAVENOUS | Status: DC
  Administered 2016-12-08: 3 mL via INTRAVENOUS

## 2016-12-08 MED ORDER — FENTANYL CITRATE (PF) 100 MCG/2ML IJ SOLN
INTRAMUSCULAR | Status: DC | PRN
  Administered 2016-12-08 (×2): 25 ug via INTRAVENOUS

## 2016-12-08 MED ORDER — SODIUM CHLORIDE 0.9 % IV SOLN: 250.0000 mL | INTRAVENOUS | Status: DC | PRN

## 2016-12-08 MED ORDER — ACETAMINOPHEN 325 MG PO TABS: 650.0000 mg | ORAL_TABLET | ORAL | Status: DC | PRN

## 2016-12-08 MED ORDER — BUPIVACAINE HCL (PF) 0.25 % IJ SOLN
INTRAMUSCULAR | Status: AC
  Filled 2016-12-08: qty 60

## 2016-12-08 MED ORDER — FENTANYL CITRATE (PF) 100 MCG/2ML IJ SOLN
INTRAMUSCULAR | Status: AC
  Filled 2016-12-08: qty 2

## 2016-12-08 MED ORDER — HEPARIN (PORCINE) IN NACL 2-0.9 UNIT/ML-% IJ SOLN
INTRAMUSCULAR | Status: AC
  Filled 2016-12-08: qty 1000

## 2016-12-08 SURGICAL SUPPLY — 17 items
BAG SNAP BAND KOVER 36X36 (MISCELLANEOUS) ×2 IMPLANT
CATH JOSEPHSON QUAD-ALLRED 6FR (CATHETERS) ×1 IMPLANT
CATH SMTCH THERMOCOOL SF DF (CATHETERS) ×1 IMPLANT
CATH SOUNDSTAR 3D IMAGING (CATHETERS) ×1 IMPLANT
CATH WEBSTER BI DIR CS D-F CRV (CATHETERS) ×1 IMPLANT
COVER SWIFTLINK CONNECTOR (BAG) ×2 IMPLANT
PACK CARDIAC CATHETERIZATION (CUSTOM PROCEDURE TRAY) IMPLANT
PACK EP LATEX FREE (CUSTOM PROCEDURE TRAY) ×2
PACK EP LF (CUSTOM PROCEDURE TRAY) ×1 IMPLANT
PAD DEFIB LIFELINK (PAD) ×2 IMPLANT
PATCH CARTO3 (PAD) ×1 IMPLANT
SHEATH AVANTI 11F 11CM (SHEATH) ×1 IMPLANT
SHEATH PINNACLE 6F 10CM (SHEATH) ×1 IMPLANT
SHEATH PINNACLE 7F 10CM (SHEATH) ×1 IMPLANT
SHEATH PINNACLE 8F 10CM (SHEATH) ×1 IMPLANT
SHIELD RADPAD SCOOP 12X17 (MISCELLANEOUS) ×2 IMPLANT
TUBING SMART ABLATE COOLFLOW (TUBING) ×1 IMPLANT

## 2016-12-08 NOTE — H&P (Signed)
Terry Johns is a 61 y.o. male with a history of PVCs. Holter monitor showed 20% PVCs. He presents today for EP study and possible ablation. On exam, regular rhythm, no murmurs, lungs clear. Risks and benefits were discussed. Risks include but not limited to bleeding, tamponade heart block, stroke, and damage to surrounding organs. He understands these risks and has agreed to ablation.  Cletis Muma Curt Bears, MD 12/08/2016 7:10 AM

## 2016-12-08 NOTE — Progress Notes (Addendum)
Site area: LFV x 2 Site Prior to Removal:  Level 0 Pressure Applied For:20 min Manual:  yes  Patient Status During Pull:  stable Post Pull Site:  Level 0 Post Pull Instructions Given: yes  Post Pull Pulses Present: palpable Dressing Applied:  tegaderm  Bedrest begins @ 2010 till 0210 Comments: removed by Gentry Fitz

## 2016-12-08 NOTE — Progress Notes (Addendum)
Site area: RFA / RFV Site Prior to Removal:  Level 0 Pressure Applied For:70 min Manual:   yes Patient Status During Pull:  stable Post Pull Site:  Level 2 Post Pull Instructions Given:  yes Post Pull Pulses Present: palpable Dressing Applied: Pressure Bedrest begins @ 2010 till 0210 Comments:relieved by Lenard Galloway at 60 min

## 2016-12-08 NOTE — Progress Notes (Signed)
Pt brought up by Chip, RN. Level 2 hematoma. Bedrest to start at 2010. Chip states he held pressure on hematoma site, and site has improved. Skin is marked hematoma is 5cm lenght  x 10cm wide. Pressure dressing is intact. Will continue to monitor.

## 2016-12-09 ENCOUNTER — Emergency Department (HOSPITAL_COMMUNITY): Payer: BLUE CROSS/BLUE SHIELD | Admitting: Anesthesiology

## 2016-12-09 ENCOUNTER — Encounter (HOSPITAL_COMMUNITY): Payer: Self-pay | Admitting: Cardiology

## 2016-12-09 ENCOUNTER — Encounter (HOSPITAL_COMMUNITY)
Admission: EM | Disposition: A | Discharge status: Home / Self Care | Payer: Self-pay | Source: Home / Self Care | Attending: Vascular Surgery

## 2016-12-09 ENCOUNTER — Telehealth: Payer: Self-pay | Admitting: Cardiology

## 2016-12-09 ENCOUNTER — Inpatient Hospital Stay (HOSPITAL_COMMUNITY)
Admission: EM | Admit: 2016-12-09 | Discharge: 2016-12-12 | DRG: 907 | Disposition: A | Discharge status: Home / Self Care | Payer: BLUE CROSS/BLUE SHIELD | Attending: Vascular Surgery | Admitting: Vascular Surgery

## 2016-12-09 DIAGNOSIS — I97638 Postprocedural hematoma of a circulatory system organ or structure following other circulatory system procedure: Secondary | ICD-10-CM | POA: Diagnosis present

## 2016-12-09 DIAGNOSIS — I724 Aneurysm of artery of lower extremity: Secondary | ICD-10-CM | POA: Diagnosis present

## 2016-12-09 DIAGNOSIS — R1310 Dysphagia, unspecified: Secondary | ICD-10-CM | POA: Diagnosis present

## 2016-12-09 DIAGNOSIS — Q796 Ehlers-Danlos syndrome: Secondary | ICD-10-CM

## 2016-12-09 DIAGNOSIS — I493 Ventricular premature depolarization: Secondary | ICD-10-CM | POA: Diagnosis not present

## 2016-12-09 DIAGNOSIS — Z7982 Long term (current) use of aspirin: Secondary | ICD-10-CM | POA: Diagnosis not present

## 2016-12-09 DIAGNOSIS — D62 Acute posthemorrhagic anemia: Secondary | ICD-10-CM | POA: Diagnosis present

## 2016-12-09 DIAGNOSIS — S75091A Other specified injury of femoral artery, right leg, initial encounter: Secondary | ICD-10-CM

## 2016-12-09 DIAGNOSIS — Y838 Other surgical procedures as the cause of abnormal reaction of the patient, or of later complication, without mention of misadventure at the time of the procedure: Secondary | ICD-10-CM | POA: Diagnosis present

## 2016-12-09 DIAGNOSIS — R579 Shock, unspecified: Secondary | ICD-10-CM

## 2016-12-09 DIAGNOSIS — R578 Other shock: Secondary | ICD-10-CM | POA: Diagnosis present

## 2016-12-09 DIAGNOSIS — D649 Anemia, unspecified: Secondary | ICD-10-CM

## 2016-12-09 DIAGNOSIS — Z8249 Family history of ischemic heart disease and other diseases of the circulatory system: Secondary | ICD-10-CM

## 2016-12-09 DIAGNOSIS — R1909 Other intra-abdominal and pelvic swelling, mass and lump: Secondary | ICD-10-CM

## 2016-12-09 DIAGNOSIS — I341 Nonrheumatic mitral (valve) prolapse: Secondary | ICD-10-CM | POA: Diagnosis present

## 2016-12-09 DIAGNOSIS — Z87891 Personal history of nicotine dependence: Secondary | ICD-10-CM

## 2016-12-09 HISTORY — PX: FEMORAL ARTERY EXPLORATION: SHX5160

## 2016-12-09 LAB — CBC WITH DIFFERENTIAL/PLATELET
Basophils Absolute: 0 10*3/uL (ref 0.0–0.1)
Basophils Relative: 0 %
Eosinophils Absolute: 0.1 10*3/uL (ref 0.0–0.7)
Eosinophils Relative: 1 %
HCT: 31.1 % — ABNORMAL LOW (ref 39.0–52.0)
Hemoglobin: 10.6 g/dL — ABNORMAL LOW (ref 13.0–17.0)
Lymphocytes Relative: 16 %
Lymphs Abs: 2.3 10*3/uL (ref 0.7–4.0)
MCH: 29 pg (ref 26.0–34.0)
MCHC: 34.1 g/dL (ref 30.0–36.0)
MCV: 85.2 fL (ref 78.0–100.0)
Monocytes Absolute: 1 10*3/uL (ref 0.1–1.0)
Monocytes Relative: 7 %
Neutro Abs: 10.8 10*3/uL — ABNORMAL HIGH (ref 1.7–7.7)
Neutrophils Relative %: 76 %
Platelets: 205 10*3/uL (ref 150–400)
RBC: 3.65 MIL/uL — ABNORMAL LOW (ref 4.22–5.81)
RDW: 13.4 % (ref 11.5–15.5)
WBC: 14.3 10*3/uL — ABNORMAL HIGH (ref 4.0–10.5)

## 2016-12-09 LAB — PREPARE FRESH FROZEN PLASMA
Unit division: 0
Unit division: 0

## 2016-12-09 LAB — POCT I-STAT 7, (LYTES, BLD GAS, ICA,H+H)
Acid-base deficit: 4 mmol/L — ABNORMAL HIGH (ref 0.0–2.0)
Bicarbonate: 22.6 mmol/L (ref 20.0–28.0)
Calcium, Ion: 1.16 mmol/L (ref 1.15–1.40)
HCT: 27 % — ABNORMAL LOW (ref 39.0–52.0)
Hemoglobin: 9.2 g/dL — ABNORMAL LOW (ref 13.0–17.0)
O2 Saturation: 100 %
Patient temperature: 36.5
Potassium: 5 mmol/L (ref 3.5–5.1)
Sodium: 138 mmol/L (ref 135–145)
TCO2: 24 mmol/L (ref 0–100)
pCO2 arterial: 45.9 mmHg (ref 32.0–48.0)
pH, Arterial: 7.297 — ABNORMAL LOW (ref 7.350–7.450)
pO2, Arterial: 362 mmHg — ABNORMAL HIGH (ref 83.0–108.0)

## 2016-12-09 LAB — PREPARE RBC (CROSSMATCH)

## 2016-12-09 LAB — I-STAT CHEM 8, ED
BUN: 18 mg/dL (ref 6–20)
Calcium, Ion: 1.11 mmol/L — ABNORMAL LOW (ref 1.15–1.40)
Chloride: 105 mmol/L (ref 101–111)
Creatinine, Ser: 1.2 mg/dL (ref 0.61–1.24)
Glucose, Bld: 234 mg/dL — ABNORMAL HIGH (ref 65–99)
HCT: 28 % — ABNORMAL LOW (ref 39.0–52.0)
Hemoglobin: 9.5 g/dL — ABNORMAL LOW (ref 13.0–17.0)
Potassium: 4.1 mmol/L (ref 3.5–5.1)
Sodium: 137 mmol/L (ref 135–145)
TCO2: 21 mmol/L (ref 0–100)

## 2016-12-09 LAB — BASIC METABOLIC PANEL
Anion gap: 13 (ref 5–15)
BUN: 17 mg/dL (ref 6–20)
CO2: 19 mmol/L — ABNORMAL LOW (ref 22–32)
Calcium: 8.3 mg/dL — ABNORMAL LOW (ref 8.9–10.3)
Chloride: 106 mmol/L (ref 101–111)
Creatinine, Ser: 1.24 mg/dL (ref 0.61–1.24)
GFR calc Af Amer: >60 mL/min (ref >60)
GFR calc non Af Amer: >60 mL/min (ref >60)
Glucose, Bld: 233 mg/dL — ABNORMAL HIGH (ref 65–99)
Potassium: 4.3 mmol/L (ref 3.5–5.1)
Sodium: 138 mmol/L (ref 135–145)

## 2016-12-09 LAB — BPAM FFP
Blood Product Expiration Date: 201804152359
Blood Product Expiration Date: 201804152359
ISSUE DATE / TIME: 201804101746
ISSUE DATE / TIME: 201804101746
Unit Type and Rh: 6200
Unit Type and Rh: 6200

## 2016-12-09 LAB — ABO/RH: ABO/RH(D): A POS

## 2016-12-09 LAB — PROTIME-INR
INR: 1.21
Prothrombin Time: 15.4 seconds — ABNORMAL HIGH (ref 11.4–15.2)

## 2016-12-09 LAB — MRSA PCR SCREENING: MRSA by PCR: NEGATIVE

## 2016-12-09 SURGERY — EXPLORATION, ARTERY, FEMORAL
Anesthesia: General | Site: Groin | Laterality: Right

## 2016-12-09 MED ORDER — CEFUROXIME SODIUM 1.5 G IJ SOLR
1.5000 g | Freq: Two times a day (BID) | INTRAMUSCULAR | Status: AC
  Administered 2016-12-09 – 2016-12-10 (×2): 1.5 g via INTRAVENOUS
  Filled 2016-12-09 (×2): qty 1.5

## 2016-12-09 MED ORDER — SODIUM CHLORIDE 0.9 % IV SOLN
INTRAVENOUS | Status: DC | PRN
  Administered 2016-12-09: 50 ug/min via INTRAVENOUS

## 2016-12-09 MED ORDER — MEPERIDINE HCL 25 MG/ML IJ SOLN: 6.2500 mg | INTRAMUSCULAR | Status: DC | PRN

## 2016-12-09 MED ORDER — HYDROMORPHONE HCL 1 MG/ML IJ SOLN: 0.2500 mg | INTRAMUSCULAR | Status: DC | PRN

## 2016-12-09 MED ORDER — BISACODYL 5 MG PO TBEC
5.0000 mg | DELAYED_RELEASE_TABLET | Freq: Every day | ORAL | Status: DC | PRN
  Administered 2016-12-11: 5 mg via ORAL
  Filled 2016-12-09: qty 1

## 2016-12-09 MED ORDER — PANTOPRAZOLE SODIUM 40 MG PO TBEC
40.0000 mg | DELAYED_RELEASE_TABLET | Freq: Every day | ORAL | Status: DC
  Administered 2016-12-10 – 2016-12-12 (×3): 40 mg via ORAL
  Filled 2016-12-09 (×3): qty 1

## 2016-12-09 MED ORDER — EPHEDRINE SULFATE 50 MG/ML IJ SOLN
INTRAMUSCULAR | Status: DC | PRN
  Administered 2016-12-09 (×2): 5 mg via INTRAVENOUS

## 2016-12-09 MED ORDER — PHENOL 1.4 % MT LIQD: 1.0000 | OROMUCOSAL | Status: DC | PRN

## 2016-12-09 MED ORDER — METOPROLOL TARTRATE 5 MG/5ML IV SOLN: 2.0000 mg | INTRAVENOUS | Status: DC | PRN

## 2016-12-09 MED ORDER — PROMETHAZINE HCL 25 MG/ML IJ SOLN
INTRAMUSCULAR | Status: AC
  Filled 2016-12-09: qty 1

## 2016-12-09 MED ORDER — PROPOFOL 10 MG/ML IV BOLUS
INTRAVENOUS | Status: DC | PRN
  Administered 2016-12-09: 50 mg via INTRAVENOUS

## 2016-12-09 MED ORDER — STERILE WATER FOR IRRIGATION IR SOLN
Status: DC | PRN
  Administered 2016-12-09: 1000 mL

## 2016-12-09 MED ORDER — ACETAMINOPHEN 325 MG PO TABS: 325.0000 mg | ORAL_TABLET | ORAL | Status: DC | PRN

## 2016-12-09 MED ORDER — LABETALOL HCL 5 MG/ML IV SOLN: 10.0000 mg | INTRAVENOUS | Status: DC | PRN

## 2016-12-09 MED ORDER — MIDAZOLAM HCL 2 MG/2ML IJ SOLN
INTRAMUSCULAR | Status: DC | PRN
  Administered 2016-12-09: 2 mg via INTRAVENOUS

## 2016-12-09 MED ORDER — 0.9 % SODIUM CHLORIDE (POUR BTL) OPTIME
TOPICAL | Status: DC | PRN
  Administered 2016-12-09: 2000 mL

## 2016-12-09 MED ORDER — POTASSIUM CHLORIDE CRYS ER 20 MEQ PO TBCR: 20.0000 meq | EXTENDED_RELEASE_TABLET | Freq: Every day | ORAL | Status: DC | PRN

## 2016-12-09 MED ORDER — ACETAMINOPHEN 650 MG RE SUPP: 325.0000 mg | RECTAL | Status: DC | PRN

## 2016-12-09 MED ORDER — GUAIFENESIN-DM 100-10 MG/5ML PO SYRP: 15.0000 mL | ORAL_SOLUTION | ORAL | Status: DC | PRN

## 2016-12-09 MED ORDER — SUCCINYLCHOLINE CHLORIDE 20 MG/ML IJ SOLN
INTRAMUSCULAR | Status: DC | PRN
  Administered 2016-12-09: 30 mg via INTRAVENOUS
  Administered 2016-12-09: 100 mg via INTRAVENOUS

## 2016-12-09 MED ORDER — SUGAMMADEX SODIUM 200 MG/2ML IV SOLN
INTRAVENOUS | Status: DC | PRN
  Administered 2016-12-09: 200.4 mg via INTRAVENOUS

## 2016-12-09 MED ORDER — CEFUROXIME SODIUM 1.5 G IJ SOLR
INTRAMUSCULAR | Status: DC | PRN
  Administered 2016-12-09: 1.5 g via INTRAVENOUS

## 2016-12-09 MED ORDER — ASPIRIN EC 81 MG PO TBEC: 81.0000 mg | DELAYED_RELEASE_TABLET | Freq: Every day | ORAL | Status: DC

## 2016-12-09 MED ORDER — SODIUM CHLORIDE 0.9 % IV SOLN
INTRAVENOUS | Status: DC
  Administered 2016-12-09: 22:00:00 via INTRAVENOUS

## 2016-12-09 MED ORDER — ONDANSETRON HCL 4 MG/2ML IJ SOLN
INTRAMUSCULAR | Status: DC | PRN
  Administered 2016-12-09: 4 mg via INTRAVENOUS

## 2016-12-09 MED ORDER — POLYETHYLENE GLYCOL 3350 17 G PO PACK: 17.0000 g | PACK | Freq: Every day | ORAL | Status: DC | PRN

## 2016-12-09 MED ORDER — PROTAMINE SULFATE 10 MG/ML IV SOLN
INTRAVENOUS | Status: DC | PRN
  Administered 2016-12-09 (×4): 10 mg via INTRAVENOUS

## 2016-12-09 MED ORDER — LACTATED RINGERS IV SOLN
INTRAVENOUS | Status: DC | PRN
  Administered 2016-12-09: 18:00:00 via INTRAVENOUS

## 2016-12-09 MED ORDER — FENTANYL CITRATE (PF) 100 MCG/2ML IJ SOLN
INTRAMUSCULAR | Status: DC | PRN
  Administered 2016-12-09: 50 ug via INTRAVENOUS
  Administered 2016-12-09: 150 ug via INTRAVENOUS

## 2016-12-09 MED ORDER — MIDAZOLAM HCL 2 MG/2ML IJ SOLN
INTRAMUSCULAR | Status: AC
  Filled 2016-12-09: qty 2

## 2016-12-09 MED ORDER — LIDOCAINE HCL (CARDIAC) 20 MG/ML IV SOLN
INTRAVENOUS | Status: DC | PRN
  Administered 2016-12-09: 100 mg via INTRAVENOUS

## 2016-12-09 MED ORDER — SODIUM CHLORIDE 0.9 % IV SOLN
INTRAVENOUS | Status: DC | PRN
  Administered 2016-12-09: 500 mL

## 2016-12-09 MED ORDER — SODIUM CHLORIDE 0.9 % IV SOLN
INTRAVENOUS | Status: DC | PRN
  Administered 2016-12-09: 19:00:00 via INTRAVENOUS

## 2016-12-09 MED ORDER — MORPHINE SULFATE (PF) 2 MG/ML IV SOLN
2.0000 mg | INTRAVENOUS | Status: DC | PRN
  Filled 2016-12-09: qty 1

## 2016-12-09 MED ORDER — PHENYLEPHRINE HCL 10 MG/ML IJ SOLN
INTRAMUSCULAR | Status: DC | PRN
  Administered 2016-12-09 (×5): 80 ug via INTRAVENOUS

## 2016-12-09 MED ORDER — SODIUM CHLORIDE 0.9 % IV SOLN: 500.0000 mL | Freq: Once | INTRAVENOUS | Status: DC | PRN

## 2016-12-09 MED ORDER — METOPROLOL TARTRATE 5 MG/5ML IV SOLN
INTRAVENOUS | Status: DC | PRN
Start: 2016-12-09 — End: 2016-12-12
  Administered 2016-12-09: 1 mg via INTRAVENOUS

## 2016-12-09 MED ORDER — MAGNESIUM SULFATE 2 GM/50ML IV SOLN
2.0000 g | Freq: Every day | INTRAVENOUS | Status: DC | PRN
  Filled 2016-12-09: qty 50

## 2016-12-09 MED ORDER — DOCUSATE SODIUM 100 MG PO CAPS
100.0000 mg | ORAL_CAPSULE | Freq: Every day | ORAL | Status: DC
  Administered 2016-12-10 – 2016-12-12 (×3): 100 mg via ORAL
  Filled 2016-12-09 (×3): qty 1

## 2016-12-09 MED ORDER — HEPARIN SODIUM (PORCINE) 1000 UNIT/ML IJ SOLN
INTRAMUSCULAR | Status: DC | PRN
  Administered 2016-12-09: 8000 [IU] via INTRAVENOUS

## 2016-12-09 MED ORDER — PROMETHAZINE HCL 25 MG/ML IJ SOLN
6.2500 mg | INTRAMUSCULAR | Status: AC | PRN
  Administered 2016-12-09 (×2): 6.25 mg via INTRAVENOUS

## 2016-12-09 MED ORDER — ENOXAPARIN SODIUM 30 MG/0.3ML ~~LOC~~ SOLN
30.0000 mg | SUBCUTANEOUS | Status: DC
Start: 2016-12-10 — End: 2016-12-12
  Administered 2016-12-10 – 2016-12-12 (×3): 30 mg via SUBCUTANEOUS
  Filled 2016-12-09 (×3): qty 0.3

## 2016-12-09 MED ORDER — ASPIRIN EC 81 MG PO TBEC
81.0000 mg | DELAYED_RELEASE_TABLET | Freq: Every day | ORAL | Status: DC
  Administered 2016-12-10 – 2016-12-12 (×3): 81 mg via ORAL
  Filled 2016-12-09 (×3): qty 1

## 2016-12-09 MED ORDER — PROPOFOL 10 MG/ML IV BOLUS
INTRAVENOUS | Status: AC
  Filled 2016-12-09: qty 20

## 2016-12-09 MED ORDER — FENTANYL CITRATE (PF) 250 MCG/5ML IJ SOLN
INTRAMUSCULAR | Status: AC
  Filled 2016-12-09: qty 5

## 2016-12-09 MED ORDER — ONDANSETRON HCL 4 MG/2ML IJ SOLN: 4.0000 mg | Freq: Four times a day (QID) | INTRAMUSCULAR | Status: DC | PRN

## 2016-12-09 MED ORDER — OXYCODONE-ACETAMINOPHEN 5-325 MG PO TABS
1.0000 | ORAL_TABLET | ORAL | Status: DC | PRN
  Administered 2016-12-09: 1 via ORAL
  Filled 2016-12-09: qty 2

## 2016-12-09 MED ORDER — DEXTROSE 5 % IV SOLN
INTRAVENOUS | Status: AC
  Filled 2016-12-09: qty 1.5

## 2016-12-09 MED ORDER — HYDRALAZINE HCL 20 MG/ML IJ SOLN: 5.0000 mg | INTRAMUSCULAR | Status: DC | PRN

## 2016-12-09 MED FILL — Midazolam HCl Inj 5 MG/5ML (Base Equivalent): INTRAMUSCULAR | Qty: 5 | Status: AC

## 2016-12-09 SURGICAL SUPPLY — 62 items
ADH SKN CLS APL DERMABOND .7 (GAUZE/BANDAGES/DRESSINGS) ×1
BANDAGE ACE 4X5 VEL STRL LF (GAUZE/BANDAGES/DRESSINGS) IMPLANT
BANDAGE ESMARK 6X9 LF (GAUZE/BANDAGES/DRESSINGS) IMPLANT
BNDG CMPR 9X6 STRL LF SNTH (GAUZE/BANDAGES/DRESSINGS)
BNDG ESMARK 6X9 LF (GAUZE/BANDAGES/DRESSINGS)
CANISTER SUCT 3000ML PPV (MISCELLANEOUS) ×2 IMPLANT
CANNULA VESSEL 3MM 2 BLNT TIP (CANNULA) ×1 IMPLANT
CANNULA VESSEL W/WING W/VALVE (CANNULA) ×1 IMPLANT
CLIP TI MEDIUM 24 (CLIP) ×2 IMPLANT
CLIP TI WIDE RED SMALL 24 (CLIP) ×2 IMPLANT
CUFF TOURNIQUET SINGLE 24IN (TOURNIQUET CUFF) IMPLANT
CUFF TOURNIQUET SINGLE 34IN LL (TOURNIQUET CUFF) IMPLANT
CUFF TOURNIQUET SINGLE 44IN (TOURNIQUET CUFF) IMPLANT
DERMABOND ADVANCED (GAUZE/BANDAGES/DRESSINGS) ×1
DERMABOND ADVANCED .7 DNX12 (GAUZE/BANDAGES/DRESSINGS) IMPLANT
DRAIN CHANNEL 15F RND FF W/TCR (WOUND CARE) IMPLANT
DRAIN CHANNEL 19F RND (DRAIN) ×1 IMPLANT
DRAIN PENROSE 3/4X12 (DRAIN) IMPLANT
DRAPE INCISE IOBAN 66X45 STRL (DRAPES) ×1 IMPLANT
DRAPE X-RAY CASS 24X20 (DRAPES) IMPLANT
ELECT REM PT RETURN 9FT ADLT (ELECTROSURGICAL) ×2
ELECTRODE REM PT RTRN 9FT ADLT (ELECTROSURGICAL) ×1 IMPLANT
EVACUATOR SILICONE 100CC (DRAIN) ×1 IMPLANT
GAUZE SPONGE 2X2 8PLY STRL LF (GAUZE/BANDAGES/DRESSINGS) IMPLANT
GLOVE BIO SURGEON STRL SZ 6.5 (GLOVE) ×3 IMPLANT
GLOVE BIO SURGEON STRL SZ7.5 (GLOVE) ×2 IMPLANT
GLOVE BIOGEL PI IND STRL 6.5 (GLOVE) IMPLANT
GLOVE BIOGEL PI IND STRL 8 (GLOVE) ×1 IMPLANT
GLOVE BIOGEL PI INDICATOR 6.5 (GLOVE) ×1
GLOVE BIOGEL PI INDICATOR 8 (GLOVE) ×1
GOWN STRL REUS W/ TWL LRG LVL3 (GOWN DISPOSABLE) ×3 IMPLANT
GOWN STRL REUS W/TWL LRG LVL3 (GOWN DISPOSABLE) ×6
KIT BASIN OR (CUSTOM PROCEDURE TRAY) ×2 IMPLANT
KIT ROOM TURNOVER OR (KITS) ×2 IMPLANT
MARKER GRAFT CORONARY BYPASS (MISCELLANEOUS) IMPLANT
NS IRRIG 1000ML POUR BTL (IV SOLUTION) ×4 IMPLANT
PACK PERIPHERAL VASCULAR (CUSTOM PROCEDURE TRAY) ×2 IMPLANT
PAD ARMBOARD 7.5X6 YLW CONV (MISCELLANEOUS) ×4 IMPLANT
PADDING CAST COTTON 6X4 STRL (CAST SUPPLIES) IMPLANT
SET COLLECT BLD 21X3/4 12 (NEEDLE) IMPLANT
SPONGE GAUZE 2X2 STER 10/PKG (GAUZE/BANDAGES/DRESSINGS) ×1
SPONGE SURGIFOAM ABS GEL 100 (HEMOSTASIS) IMPLANT
STAPLER VISISTAT (STAPLE) IMPLANT
STOPCOCK 4 WAY LG BORE MALE ST (IV SETS) IMPLANT
SUT ETHILON 3 0 PS 1 (SUTURE) ×1 IMPLANT
SUT PROLENE 5 0 C 1 24 (SUTURE) ×3 IMPLANT
SUT PROLENE 6 0 BV (SUTURE) ×2 IMPLANT
SUT PROLENE 7 0 BV 1 (SUTURE) IMPLANT
SUT SILK 2 0 FS (SUTURE) ×1 IMPLANT
SUT SILK 3 0 (SUTURE)
SUT SILK 3-0 18XBRD TIE 12 (SUTURE) IMPLANT
SUT VIC AB 2-0 CT1 27 (SUTURE)
SUT VIC AB 2-0 CT1 TAPERPNT 27 (SUTURE) IMPLANT
SUT VIC AB 2-0 CTB1 (SUTURE) ×3 IMPLANT
SUT VIC AB 3-0 SH 27 (SUTURE) ×2
SUT VIC AB 3-0 SH 27X BRD (SUTURE) ×2 IMPLANT
SUT VICRYL 4-0 PS2 18IN ABS (SUTURE) ×3 IMPLANT
TAPE CLOTH SURG 4X10 WHT LF (GAUZE/BANDAGES/DRESSINGS) ×1 IMPLANT
TRAY FOLEY W/METER SILVER 16FR (SET/KITS/TRAYS/PACK) ×1 IMPLANT
TUBING EXTENTION W/L.L. (IV SETS) IMPLANT
UNDERPAD 30X30 (UNDERPADS AND DIAPERS) ×2 IMPLANT
WATER STERILE IRR 1000ML POUR (IV SOLUTION) ×2 IMPLANT

## 2016-12-09 NOTE — Discharge Instructions (Signed)
No driving for 3 days. No lifting over 5 lbs for 1 week. No sexual activity for 1 week. You may return to work in 1 week.  Keep procedure site clean & dry. If you notice increased pain, swelling, bleeding or pus, call/return!  You may shower, but no soaking baths/hot tubs/pools for 1 week.  ° ° °

## 2016-12-09 NOTE — Op Note (Signed)
    NAME: Terry Johns   MRN: [id number] DOB: [birth year]    DATE OF OPERATION: 12/09/2016  PREOP DIAGNOSIS: Bleeding right groin  POSTOP DIAGNOSIS: Same  PROCEDURE:  1. Evacuation of hematoma right groin 2. Repair of right superficial femoral artery  SURGEON: Judeth Cornfield. Scot Dock, MD, FACS  ASSIST: Gerri Lins PA  ANESTHESIA: Gen.   EBL: 100 cc  INDICATIONS: Terry Johns is a 61 y.o. male who underwent a radiofrequency ablation procedure yesterday via bilateral femoral approaches. He went home this morning. He developed sudden onset of pain and swelling in the right groin at proximally 3:30 today and was brought to the emergency room by EMS. He had significant swelling in the right groin with compromise of the overlying skin and hypotension. He was taken urgently to the operating room for exploration and repair of the artery.  FINDINGS: The cannulation site in the proximal right superficial femoral artery was repaired with 2 5-0 Prolene sutures. A large amount of hematoma was evacuated from the lateral aspect of the thigh. A 19 Blake drain was placed.  TECHNIQUE: The patient was taken to the operating room and received a general anesthetic. He received 2 units of blood. The abdomen and groins were prepped and draped in the usual sterile fashion. A longitudinal incision was made over the femoral artery and dissection carried down to the common femoral artery above the area of concern and I was able to get proximal control here. I then continued the dissection distally until the site of bleeding was identified and this was controlled with digital pressure. The patient was then heparinized. Well maintaining control of the bleeding with digital pressure, I was able to control the superficial femoral artery distally. The artery was then clamped proximally and distally and the hole in the superficial femoral artery closed with 2 5-0 Prolene sutures. Good hemostasis was obtained. I  mobilized the artery and did not find any other injuries. The wound was irrigated with cold spots of saline. Most of the hematoma and tracked laterally. It was able to evacuate a large amount of hematoma from this area. A 19 Blake drain was placed. The wound was closed in 2 deep layers of 2-0 Vicryl, a subcutaneous layer of 3-0 Vicryl, and the skin closed with a 40 subcutaneous stitch. Sterile dressing was applied. The patient tolerated the procedure well and was transferred to the recovery room in stable condition. All needle and sponge counts were correct.  Deitra Mayo, MD, FACS [facility]  DATE OF DICTATION:   12/09/2016

## 2016-12-09 NOTE — Progress Notes (Deleted)
Terry Johns is a 61 y.o. 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 Today while sitting on the couch, had a sharp pain in his leg and felt blood running from his arterial groin site. Presented to the ER and was taken for surgery to repair the femoral artery. Had successful closure of the SFA with hematoma evacuation. Since the procedure, on telemetry, has had rare PVCs. Management of his bleeding site per surgery. No change necessary for his PVCs post ablation.  Will Curt Bears, MD 12/09/2016 9:35 PM

## 2016-12-09 NOTE — ED Triage Notes (Signed)
Pt brought in by EMS due to having groin swelling and pain. Pt had ablation yesterday and has hematoma and swelling in right groin. Pt endorses fatigue and weakness. Pt is hypotensive on arrival. Pt a&ox4.

## 2016-12-09 NOTE — Anesthesia Procedure Notes (Signed)
Procedure Name: Intubation Date/Time: 12/09/2016 6:42 PM Performed by: Eligha Bridegroom Pre-anesthesia Checklist: Emergency Drugs available, Patient identified, Suction available, Patient being monitored and Timeout performed Patient Re-evaluated:Patient Re-evaluated prior to inductionOxygen Delivery Method: Circle system utilized Preoxygenation: Pre-oxygenation with 100% oxygen Intubation Type: IV induction, Rapid sequence and Cricoid Pressure applied Laryngoscope Size: Mac and 4 Grade View: Grade I Tube type: Oral Tube size: 7.5 mm Number of attempts: 1 Airway Equipment and Method: Stylet Secured at: 22 cm Tube secured with: Tape Dental Injury: Teeth and Oropharynx as per pre-operative assessment

## 2016-12-09 NOTE — Telephone Encounter (Signed)
New Message    Pt wife called stating she had to call 911 for husband due to blood filling up, and creating a lump near his site where they removed his leg catheter. Requesting call back

## 2016-12-09 NOTE — Transfer of Care (Signed)
Immediate Anesthesia Transfer of Care Note  Patient: Terry Johns  Procedure(s) Performed: Procedure(s): Evacuation of Hematoma Right Groin, Repair of Right Superfiical Femoral Artery (Right)  Patient Location: PACU  Anesthesia Type:General  Level of Consciousness: awake, alert  and oriented  Airway & Oxygen Therapy: Patient Spontanous Breathing and Patient connected to nasal cannula oxygen  Post-op Assessment: Report given to RN and Post -op Vital signs reviewed and stable  Post vital signs: Reviewed and stable  Last Vitals:  Vitals:   12/09/16 1810 12/09/16 2005  BP: (!) 87/56 112/78  Pulse: (!) 120 93  Resp: (!) 25 16  Temp:  37 C    Last Pain:  Vitals:   12/09/16 1750  TempSrc: Oral  PainSc:          Complications: No apparent anesthesia complications

## 2016-12-09 NOTE — Discharge Summary (Signed)
ELECTROPHYSIOLOGY PROCEDURE DISCHARGE SUMMARY    Patient ID: Terry Johns,  MRN: [id number], DOB/AGE: 62-Apr-1957 62 y.o.  Admit date: 12/08/2016 Discharge date: 12/09/2016  Primary Care Physician: [name], MD Primary Cardiologist: [name] Electrophysiologist: [facility]   Primary Discharge Diagnosis:  1.  PVC's s/p ablation this admission   No Known Allergies  Procedures This Admission:  1.  Electrophysiology study and radiofrequency catheter ablation on 12/08/16 by Dr Curt Bears. This study demonstrated frequent PVC's mapped to the posterior LV septum that were successfully ablated. There were no early apparent complications.   Brief HPI:  Terry Johns is a 61 y.o. male who was found to have a high burden of PVC's and referred to EP for further evaluation. He was seen by Dr Curt Bears and EPS/RFCA was recommended. Risks, benefits were reviewed and the patient wished to proceed.  Hospital Course:  The patient was admitted and underwent ablation with details as outlined above. He was monitored on telemetry overnight. Groin with soft hematoma per Dr Curt Bears. He was seen by Dr Curt Bears and considered stable for discharge to home. Because of LV ablation site, Dr Curt Bears would like patient to take ASA 52m daily for 4 weeks starting tomorrow.   Physical Exam: Vitals:   12/08/16 2200 12/08/16 2300 12/09/16 0625 12/09/16 0800  BP: 137/77 (!) 141/74 118/61 118/74  Pulse: 96 89 81 88  Resp: (!) 22 18 (!) 23 18  Temp:   97.3 F (36.3 C) 98.1 F (36.7 C)  TempSrc:   Oral Oral  SpO2: 94% 93% 95% 92%  Weight:   212 lb 1.3 oz (96.2 kg)   Height:         Labs:   Lab Results  Component Value Date   WBC 5.1 12/01/2016   HGB 14.9 01/28/2011   HCT 42.8 12/01/2016   MCV 84 12/01/2016   PLT 210 12/01/2016   No results for input(s): NA, K, CL, CO2, BUN, CREATININE, CALCIUM, PROT, BILITOT, ALKPHOS, ALT, AST, GLUCOSE in the last 168 hours.  Invalid input(s): LABALBU   Discharge  Medications:  Current Discharge Medication List    START taking these medications   Details  aspirin EC 81 MG tablet Take 1 tablet (81 mg total) by mouth daily. Start 12/10/16. Take for 4 weeks post procedure then discontinue. Avoid NSAIDS while taking daily aspirin.      CONTINUE these medications which have NOT CHANGED   Details  ibuprofen (ADVIL,MOTRIN) 200 MG tablet Take 800 mg by mouth daily as needed (pain).       STOP taking these medications     Na Sulfate-K Sulfate-Mg Sulf (SUPREP BOWEL PREP KIT) 17.5-3.13-1.6 GM/180ML SOLN         Disposition: Pt is being discharged home today in good condition. Discharge Instructions    Diet - low sodium heart healthy    Complete by:  As directed    Increase activity slowly    Complete by:  As directed      Follow-up Information    Michaelina Blandino MMeredith Leeds MD Follow up on 12/29/2016.   Specialty:  Cardiology Why:  at 3:45PM  Contact information: [phone]          Duration of Discharge Encounter: Greater than 30 minutes including physician time.  Signed, AChanetta Marshall NP 12/09/2016 8:34 AM  I have seen and examined this patient with AChanetta Marshall  Agree with above, note added to reflect my findings.  On exam, RRR, no murmurs, lungs clear. Had ablation for PVCs performed yesterday. PVCs were originating from the posterior inferior septum. With ablation, PVCs resolved. He is having a few PVCs on the monitor today. We'll continue with current management plan for discharge.    Will M. Camnitz MD 12/09/2016 10:19 AM

## 2016-12-09 NOTE — Telephone Encounter (Signed)
ER called to make Dr. Curt Bears aware that patient presented back to ER today after EP procedure yesterday, with large groin hematoma and hypotension and is going to OR with vascular surgery for exploration.

## 2016-12-09 NOTE — Anesthesia Preprocedure Evaluation (Addendum)
Anesthesia Evaluation  Patient identified by MRN, date of birth, ID band Patient awake    Reviewed: Allergy & Precautions, NPO status , Patient's Chart, lab work & pertinent test results  Airway Mallampati: II  TM Distance: >3 FB Neck ROM: Full    Dental no notable dental hx.    Pulmonary neg pulmonary ROS, former smoker,    Pulmonary exam normal breath sounds clear to auscultation       Cardiovascular negative cardio ROS   Rhythm:Regular Rate:Tachycardia + Systolic murmurs    Neuro/Psych PSYCHIATRIC DISORDERS Anxiety negative neurological ROS     GI/Hepatic negative GI ROS, Neg liver ROS,   Endo/Other  negative endocrine ROS  Renal/GU negative Renal ROS     Musculoskeletal negative musculoskeletal ROS (+)   Abdominal   Peds  Hematology negative hematology ROS (+)   Anesthesia Other Findings   Reproductive/Obstetrics                           Anesthesia Physical Anesthesia Plan  ASA: III and emergent  Anesthesia Plan: General   Post-op Pain Management:    Induction: Intravenous  Airway Management Planned: Oral ETT  Additional Equipment:   Intra-op Plan:   Post-operative Plan: Extubation in OR  Informed Consent: I have reviewed the patients History and Physical, chart, labs and discussed the procedure including the risks, benefits and alternatives for the proposed anesthesia with the patient or authorized representative who has indicated his/her understanding and acceptance.   Dental advisory given  Plan Discussed with: CRNA  Anesthesia Plan Comments: (2 x PIV, +/- a line)        Anesthesia Quick Evaluation

## 2016-12-09 NOTE — H&P (Signed)
Patient name: Terry Johns MRN: [id number] DOB: 1956-01-06 Sex: male  REASON FOR ADMISSION: Bleeding from right groin. Consult is from the emergency department.  HPI: MIKHAEL Johns is a 61 y.o. male, who underwent a radiofrequency ablation yesterday for supraventricular tachycardia. This was done via bilateral femoral approach. Patient tells me that they had some problems with the right groin when they were holding pressure.  He was admitted overnight for observation and went home this morning. Around 3:30 PM, he developed the sudden onset of pain and swelling in the right groin and was brought to the emergency department by EMS. He was evaluated by the emergency department and was found has significant swelling in the right groin. Vascular surgery was consulted.  He currently denies pain or paresthesias in the right lower extremity.  Past Medical History:  Diagnosis Date  . Cataract   . DYSPHAGIA UNSPECIFIED 05/28/2010  . Ehlers-Danlos disease   . 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 EXTERNAL HEMORRHOIDS 05/17/2009  . MITRAL VALVE PROLAPSE 05/17/2009    Family History  Problem Relation Age of Onset  . Diabetes Father   . Hypertension Father   . Cancer Father     lung, basal cell, melanoma  . Breast cancer Mother   . Colon cancer Neg Hx   . Esophageal cancer Neg Hx   . Rectal cancer Neg Hx   . Stomach cancer Neg Hx     SOCIAL HISTORY: Social History   Social History  . Marital status: Married    Spouse name: N/A  . Number of children: N/A  . Years of education: N/A   Occupational History  . Not on file.   Social History Main Topics  . Smoking status: Former Smoker    Packs/day: 0.10    Years: 3.00    Types: Cigarettes  . Smokeless tobacco: Never Used     Comment: "12/08/2016 quit smoking in my early 20s"  . Alcohol use No  . Drug use: No  . Sexual activity: Yes   Other Topics Concern  . Not on file   Social History Narrative  . No narrative on file     No Known Allergies  No current facility-administered medications for this encounter.    Current Outpatient Prescriptions  Medication Sig Dispense Refill  . aspirin EC 81 MG tablet Take 1 tablet (81 mg total) by mouth daily. Start 12/10/16. Take for 4 weeks post procedure then discontinue. Avoid NSAIDS while taking daily aspirin.    Marland Kitchen ibuprofen (ADVIL,MOTRIN) 200 MG tablet Take 800 mg by mouth daily as needed (pain).       REVIEW OF SYSTEMS:  [X]  denotes positive finding, [ ]  denotes negative finding Cardiac  Comments:  Chest pain or chest pressure:    Shortness of breath upon exertion:    Short of breath when lying flat:    Irregular heart rhythm:        Vascular    Pain in calf, thigh, or hip brought on by ambulation:    Pain in feet at night that wakes you up from your sleep:     Blood clot in your veins:    Leg swelling:         Pulmonary    Oxygen at home:    Productive cough:     Wheezing:         Neurologic    Sudden weakness in arms or legs:     Sudden numbness in arms or legs:  Sudden onset of difficulty speaking or slurred speech:    Temporary loss of vision in one eye:     Problems with dizziness:         Gastrointestinal    Blood in stool:     Vomited blood:         Genitourinary    Burning when urinating:     Blood in urine:        Psychiatric    Major depression:         Hematologic    Bleeding problems:    Problems with blood clotting too easily:        Skin    Rashes or ulcers:        Constitutional    Fever or chills:      PHYSICAL EXAM: Vitals:   12/09/16 1737 12/09/16 1740 12/09/16 1750 12/09/16 1800  BP: (!) 77/52 (!) 84/59 (!) 87/62 (!) 84/57  Pulse: (!) 114 (!) 122 (!) 113 (!) 113  Resp: 20 17 (!) 22 (!) 22  Temp:   97.7 F (36.5 C)   TempSrc:   Oral   SpO2: (!) 88% 93% 91% 100%  Weight:      Height:        GENERAL: The patient is a well-nourished male, in no acute distress. The vital signs are documented  above. CARDIAC: There is a regular rate and rhythm.  VASCULAR: I do not detect carotid bruits. He has significant swelling and bruising in the right groin which extends laterally. I do not detect a right femoral bruit. He has a palpable left femoral pulse. I cannot palpate pedal pulses, however both feet appear adequately perfused. PULMONARY: There is good air exchange bilaterally without wheezing or rales. ABDOMEN: Soft and non-tender with normal pitched bowel sounds.  MUSCULOSKELETAL: There are no major deformities or cyanosis. NEUROLOGIC: No focal weakness or paresthesias are detected. SKIN: There are no ulcers or rashes noted. PSYCHIATRIC: The patient has a normal affect.  DATA:   Hemoglobin is 9.5.  MEDICAL ISSUES:  HEMATOMA RIGHT GROIN STATUS POST RIGHT FEMORAL CATH YESTERDAY: This patient had a significant bleeding episode this afternoon in the right groin with swelling which is compromising the overlying skin. I have recommended urgent exploration in the operating room to control any bleeding. I have explained that the main risk is wound healing problems and infection given that the tissue was compromised by the large hematoma in the significant bruising in the skin. We have we have discussed the indications for the procedure and the potential complications and he is agreeable to proceed. All his questions were answered.   Deitra Mayo [facility] [place] [phone]

## 2016-12-09 NOTE — Progress Notes (Signed)
Terry Johns is a 61 y.o. male with a history of PVCs who had a PVC ablation 12/08/16. Today while sitting on the couch, had a sharp pain in his leg and felt blood running from his arterial groin site. Presented to the ER and was taken for surgery to repair the femoral artery. Had successful closure of the SFA with hematoma evacuation. Since the procedure, on telemetry, has had rare PVCs. Management of his bleeding site per surgery. No change necessary for his PVCs post ablation.  Terry Weikel Curt Bears, MD 12/09/2016 9:35 PM

## 2016-12-09 NOTE — Progress Notes (Signed)
Belongings given to family by ER RN

## 2016-12-09 NOTE — Care Management Note (Signed)
Case Management Note  Patient Details  Name: Terry Johns MRN: [id number] Date of Birth: Jan 21, 1956  Subjective/Objective:    s/p PVC ablation, for dc today. NCM will cont to follow for dc needs.                Action/Plan:   Expected Discharge Date:  12/09/16               Expected Discharge Plan:  Home/Self Care  In-House Referral:     Discharge planning Services  CM Consult  Post Acute Care Choice:    Choice offered to:     DME Arranged:    DME Agency:     HH Arranged:    HH Agency:     Status of Service:  Completed, signed off  If discussed at [employer] of Stay Meetings, dates discussed:    Additional Comments:  Zenon Mayo, RN 12/09/2016, 9:04 AM

## 2016-12-10 ENCOUNTER — Encounter (HOSPITAL_COMMUNITY): Payer: Self-pay | Admitting: Vascular Surgery

## 2016-12-10 LAB — CBC
HCT: 26.5 % — ABNORMAL LOW (ref 39.0–52.0)
Hemoglobin: 9.5 g/dL — ABNORMAL LOW (ref 13.0–17.0)
MCH: 29.9 pg (ref 26.0–34.0)
MCHC: 35.8 g/dL (ref 30.0–36.0)
MCV: 83.3 fL (ref 78.0–100.0)
Platelets: 119 10*3/uL — ABNORMAL LOW (ref 150–400)
RBC: 3.18 MIL/uL — ABNORMAL LOW (ref 4.22–5.81)
RDW: 13.6 % (ref 11.5–15.5)
WBC: 8.6 10*3/uL (ref 4.0–10.5)

## 2016-12-10 LAB — BASIC METABOLIC PANEL
Anion gap: 6 (ref 5–15)
BUN: 14 mg/dL (ref 6–20)
CO2: 22 mmol/L (ref 22–32)
Calcium: 7.7 mg/dL — ABNORMAL LOW (ref 8.9–10.3)
Chloride: 107 mmol/L (ref 101–111)
Creatinine, Ser: 0.75 mg/dL (ref 0.61–1.24)
GFR calc Af Amer: >60 mL/min (ref >60)
GFR calc non Af Amer: >60 mL/min (ref >60)
Glucose, Bld: 138 mg/dL — ABNORMAL HIGH (ref 65–99)
Potassium: 4.3 mmol/L (ref 3.5–5.1)
Sodium: 135 mmol/L (ref 135–145)

## 2016-12-10 LAB — BLOOD PRODUCT ORDER (VERBAL) VERIFICATION

## 2016-12-10 NOTE — ED Provider Notes (Signed)
[facility] DEPT Provider Note   CSN: [id number] Arrival date & time: 12/09/16  1715     History   Chief Complaint Chief Complaint  Patient presents with  . Groin Swelling  . Hypotension    HPI Terry Johns is a 61 y.o. male.  HPI  Patient had a right femoral artery catheter ablation yesterday. Patient stated that had difficulty getting the bleeding to stop about half but he was without bleeding for an overnight stay in the hospital and was discharged today. Sometime 1 hour to 2 hours prior to arrival he felt swelling in his right groin and he looked down in the area seemed to be more swollen and dark than previously. EMS was called and the patient was hypotensive with blood pressures 80s over 60s so fluids were started and patient brought here for further evaluation. Patient feels weak and dizzy at this point. No other associated or modifying symptoms.  Past Medical History:  Diagnosis Date  . Cataract   . DYSPHAGIA UNSPECIFIED 05/28/2010  . Ehlers-Danlos disease   . 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 EXTERNAL HEMORRHOIDS 05/17/2009  . MITRAL VALVE PROLAPSE 05/17/2009    Patient Active Problem List   Diagnosis Date Noted  . 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 PVC (premature ventricular contraction) 12/08/2016  . Ehlers-Danlos syndrome 01/31/2011  . ANXIETY STATE, UNSPECIFIED 05/28/2010  . DYSPHAGIA UNSPECIFIED 05/28/2010  . MITRAL VALVE PROLAPSE 05/17/2009  . EXTERNAL HEMORRHOIDS 05/17/2009  . 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 COLONOSCOPY    . FEMORAL ARTERY EXPLORATION Right 12/09/2016   Procedure: Evacuation of Hematoma Right Groin, Repair of Right Superfiical Femoral Artery;  Surgeon: Angelia Mould, MD;  Location: [facility];  Service: Vascular;  Laterality: Right;  . PVC ABLATION  12/08/2016  . PVC ABLATION N/A 12/08/2016   Procedure: PVC Ablation;  Surgeon: Will  [name], MD;  Location: [facility];  Service: Cardiovascular;  Laterality: N/A;  . SHOULDER ARTHROSCOPY WITH ROTATOR CUFF REPAIR Left 2017   "[name]"       Home Medications    Prior to Admission medications   Medication Sig Start Date End Date Taking? Authorizing Provider  aspirin EC 81 MG tablet Take 1 tablet (81 mg total) by mouth daily. Start 12/10/16. Take for 4 weeks post procedure then discontinue. Avoid NSAIDS while taking daily aspirin. 12/09/16  Yes Amber Sena Slate, NP    Family History Family History  Problem Relation Age of Onset  . Diabetes Father   . Hypertension Father   . Cancer Father     lung, basal cell, melanoma  . Breast cancer Mother   . Colon cancer Neg Hx   . Esophageal cancer Neg Hx   . Rectal cancer Neg Hx   . Stomach cancer Neg Hx     Social History Social History  Substance Use Topics  . Smoking status: Former Smoker    Packs/day: 0.10    Years: 3.00    Types: Cigarettes  . Smokeless tobacco: Never Used     Comment: "12/08/2016 quit smoking in my early 20s"  . Alcohol use No     Allergies   Patient has no known allergies.   Review of Systems Review of Systems  All other systems reviewed and are negative.    Physical Exam Updated Vital Signs BP 125/74 (BP Location: Left Arm)   Pulse 99   Temp 99.4  F (37.4 C) (Oral)   Resp 20   Ht 6\' 1"  (1.854 m)   Wt 221 lb (100.2 kg)   SpO2 98%   BMI 29.16 kg/m   Physical Exam  Constitutional: He is oriented to person, place, and time. He appears well-developed and well-nourished.  HENT:  Head: Normocephalic and atraumatic.  Eyes: Conjunctivae and EOM are normal.  Pale conjunctiva  Neck: Normal range of motion.  Cardiovascular: Tachycardia present.   Pulmonary/Chest: Effort normal. No respiratory distress.  Abdominal: Soft. He exhibits no distension.  Musculoskeletal: Normal range of motion. He exhibits no edema or deformity.  Expanding hematoma in right  groin, marked  Neurological: He is alert and oriented to person, place, and time. No cranial nerve deficit. Coordination normal.  Skin: Skin is warm and dry. No rash noted. There is pallor.  Nursing note and vitals reviewed.    ED Treatments / Results  Labs (all labs ordered are listed, but only abnormal results are displayed) Labs Reviewed  PROTIME-INR - Abnormal; Notable for the following:       Result Value   Prothrombin Time 15.4 (*)    All other components within normal limits  CBC WITH DIFFERENTIAL/PLATELET - Abnormal; Notable for the following:    WBC 14.3 (*)    RBC 3.65 (*)    Hemoglobin 10.6 (*)    HCT 31.1 (*)    Neutro Abs 10.8 (*)    All other components within normal limits  BASIC METABOLIC PANEL - Abnormal; Notable for the following:    CO2 19 (*)    Glucose, Bld 233 (*)    Calcium 8.3 (*)    All other components within normal limits  CBC - Abnormal; Notable for the following:    RBC 3.18 (*)    Hemoglobin 9.5 (*)    HCT 26.5 (*)    Platelets 119 (*)    All other components within normal limits  BASIC METABOLIC PANEL - Abnormal; Notable for the following:    Glucose, Bld 138 (*)    Calcium 7.7 (*)    All other components within normal limits  I-STAT CHEM 8, ED - Abnormal; Notable for the following:    Glucose, Bld 234 (*)    Calcium, Ion 1.11 (*)    Hemoglobin 9.5 (*)    HCT 28.0 (*)    All other components within normal limits  POCT I-STAT 7, (LYTES, BLD GAS, ICA,H+H) - Abnormal; Notable for the following:    pH, Arterial 7.297 (*)    pO2, Arterial 362.0 (*)    Acid-base deficit 4.0 (*)    HCT 27.0 (*)    Hemoglobin 9.2 (*)    All other components within normal limits  MRSA PCR SCREENING  TYPE AND SCREEN  PREPARE FRESH FROZEN PLASMA  ABO/RH  PREPARE RBC (CROSSMATCH)  BLOOD PRODUCT ORDER (VERBAL) VERIFICATION    EKG  EKG Interpretation None       Radiology No results found.  Procedures Procedures (including critical care  time)  CRITICAL CARE Performed by: Merrily Pew Total critical care time: 35 minutes Critical care time was exclusive of separately billable procedures and treating other patients. Critical care was necessary to treat or prevent imminent or life-threatening deterioration. Critical care was time spent personally by me on the following activities: development of treatment plan with patient and/or surrogate as well as nursing, discussions with consultants, evaluation of patient's response to treatment, examination of patient, obtaining history from patient or surrogate, ordering and performing treatments  and interventions, ordering and review of laboratory studies, ordering and review of radiographic studies, pulse oximetry and re-evaluation of patient's condition.   Medications Ordered in ED Medications  aspirin EC tablet 81 mg (81 mg Oral Given 12/10/16 1032)  0.9 %  sodium chloride infusion (not administered)  magnesium sulfate IVPB 2 g 50 mL (not administered)  potassium chloride SA (K-DUR,KLOR-CON) CR tablet 20-40 mEq (not administered)  acetaminophen (TYLENOL) tablet 325-650 mg (not administered)    Or  acetaminophen (TYLENOL) suppository 325-650 mg (not administered)  docusate sodium (COLACE) capsule 100 mg (100 mg Oral Given 12/10/16 1032)  ondansetron (ZOFRAN) injection 4 mg (not administered)  pantoprazole (PROTONIX) EC tablet 40 mg (40 mg Oral Given 12/10/16 1032)  labetalol (NORMODYNE,TRANDATE) injection 10 mg (not administered)  hydrALAZINE (APRESOLINE) injection 5 mg (not administered)  metoprolol (LOPRESSOR) injection 2-5 mg (not administered)  guaiFENesin-dextromethorphan (ROBITUSSIN DM) 100-10 MG/5ML syrup 15 mL (not administered)  phenol (CHLORASEPTIC) mouth spray 1 spray (not administered)  enoxaparin (LOVENOX) injection 30 mg (30 mg Subcutaneous Given 12/10/16 1032)  0.9 %  sodium chloride infusion ( Intravenous New Bag/Given 12/09/16 2146)  oxyCODONE-acetaminophen  (PERCOCET/ROXICET) 5-325 MG per tablet 1-2 tablet (1 tablet Oral Given 12/09/16 2147)  morphine 2 MG/ML injection 2-5 mg (not administered)  polyethylene glycol (MIRALAX / GLYCOLAX) packet 17 g (not administered)  bisacodyl (DULCOLAX) EC tablet 5 mg (not administered)  promethazine (PHENERGAN) 25 MG/ML injection (not administered)  promethazine (PHENERGAN) injection 6.25-12.5 mg (6.25 mg Intravenous Given 12/09/16 2041)  dextrose 5 % with cefUROXime (ZINACEF) ADS Med (  Override pull for Anesthesia 12/09/16 1900)  cefUROXime (ZINACEF) 1.5 g in dextrose 5 % 50 mL IVPB (1.5 g Intravenous Given 12/10/16 1032)     Initial Impression / Assessment and Plan / ED Course  I have reviewed the triage vital signs and the nursing notes.  Pertinent labs & imaging results that were available during my care of the patient were reviewed by me and considered in my medical decision making (see chart for details).  Here with expanding hematoma over right femoral artery, hypotensive and tachycardic. Appears ashen and pale. Emergent release blood given. Vascular surgery consulted immediately and will take to OR.    Final Clinical Impressions(s) / ED Diagnoses   Final diagnoses:  Groin swelling  Anemia, unspecified type  [facility])    New Prescriptions Current Discharge Medication List       Merrily Pew, MD 12/10/16 1545

## 2016-12-10 NOTE — Progress Notes (Signed)
Pt transferred from 4E04 to 2W06 after report given by day RN.  Pt accompanied by family.

## 2016-12-10 NOTE — Evaluation (Signed)
Occupational Therapy Evaluation and Discharge Patient Details Name: Terry Johns MRN: [id number] DOB: 1956-06-18 Today's Date: 12/10/2016    History of Present Illness s/p ablation of supraventricular tachycardia complicated by R groin hematoma requiring evacuation and repair of R superficial femoral artery.   Clinical Impression   Pt demonstrating ability to mobilize at a modified independent level. Educated pt in compensatory strategies for LB bathing and dressing as pain limits ability for him to access his R foot. Pt will have support of his family as needed at home. No further OT needs.    Follow Up Recommendations  No OT follow up    Equipment Recommendations  None recommended by OT    Recommendations for Other Services       Precautions / Restrictions Restrictions Weight Bearing Restrictions: No      Mobility Bed Mobility               General bed mobility comments: pt received in chair  Transfers Overall transfer level: Modified independent               General transfer comment: pt slow to rise due to pain    Balance                                           ADL either performed or assessed with clinical judgement   ADL Overall ADL's : Needs assistance/impaired Eating/Feeding: Independent;Sitting   Grooming: Oral care;Standing;Independent   Upper Body Bathing: Supervision/ safety;Standing   Lower Body Bathing: Minimal assistance;Sit to/from stand   Upper Body Dressing : Set up;Sitting   Lower Body Dressing: Minimal assistance;Sit to/from stand   Toilet Transfer: Modified Independent;Ambulation   Toileting- Clothing Manipulation and Hygiene: Modified independent       Functional mobility during ADLs: Modified independent General ADL Comments: Educated pt in compensatory strategies for LB bathing and dressing. Pt will rely on his wife as needed to assist.     Vision Baseline Vision/History: Wears glasses Wears  Glasses: At all times Patient Visual Report: No change from baseline       Perception     Praxis      Pertinent Vitals/Pain Pain Assessment: Faces Faces Pain Scale: Hurts little more Pain Location: R groin Pain Descriptors / Indicators: Sore Pain Intervention(s): Monitored during session     Hand Dominance Right   Extremity/Trunk Assessment Upper Extremity Assessment Upper Extremity Assessment: Overall WFL for tasks assessed   Lower Extremity Assessment Lower Extremity Assessment: Defer to PT evaluation   Cervical / Trunk Assessment Cervical / Trunk Assessment: Normal   Communication Communication Communication: No difficulties   Cognition Arousal/Alertness: Awake/alert Behavior During Therapy: WFL for tasks assessed/performed Overall Cognitive Status: Within Functional Limits for tasks assessed                                     General Comments       Exercises     Shoulder Instructions      Home Living Family/patient expects to be discharged to:: Private residence Living Arrangements: Spouse/significant other Available Help at Discharge: Family;Available 24 hours/day Type of Home: House Home Access: Stairs to enter [employer] of Steps: 2 Entrance Stairs-Rails: None Home Layout: Two level;Able to live on main level with bedroom/bathroom     Bathroom Shower/Tub: Walk-in  shower   Bathroom Toilet: Handicapped height     Home Equipment: None          Prior Functioning/Environment Level of Independence: Independent        Comments: Pt works in [employer].        OT Problem List: Pain      OT Treatment/Interventions:      OT Goals(Current goals can be found in the care plan section) Acute Rehab OT Goals Patient Stated Goal: return to PLOF  OT Frequency:     Barriers to D/C:            Co-evaluation              End of Session Equipment Utilized During Treatment: Gait belt Nurse Communication:  (aware  of drainage from incision)  Activity Tolerance: Patient tolerated treatment well Patient left:  (ambulating with PT)  OT Visit Diagnosis: Pain Pain - Right/Left: Right Pain - part of body: Leg                Time: 0174-9449 OT Time Calculation (min): 15 min Charges:  OT General Charges $OT Visit: 1 Procedure OT Evaluation $OT Eval Low Complexity: 1 Procedure G-Codes:       Malka So 12/10/2016, 9:53 AM  [phone]

## 2016-12-10 NOTE — Telephone Encounter (Signed)
Dr. Curt Bears made aware yesterday evening.

## 2016-12-10 NOTE — Care Management Note (Signed)
Case Management Note  Patient Details  Name: SEANN GENTHER MRN: [id number] Date of Birth: [birth year]  Subjective/Objective:  From home with wife, s/p  Ablation  on 4/10 presents with hematoma.  Now POD 1 evacuation of hematoma right groin and repair of right superficial femoral artery.  PTA indep.  Has a PCP and medication coverage and transport at dc.  NCM will cont to follow for dc needs.                 Action/Plan:   Expected Discharge Date:                  Expected Discharge Plan:  Home/Self Care  In-House Referral:     Discharge planning Services  CM Consult  Post Acute Care Choice:    Choice offered to:     DME Arranged:    DME Agency:     HH Arranged:    HH Agency:     Status of Service:  Completed, signed off  If discussed at [employer] of Stay Meetings, dates discussed:    Additional Comments:  Zenon Mayo, RN 12/10/2016, 2:55 PM

## 2016-12-10 NOTE — Evaluation (Signed)
Physical Therapy Evaluation Patient Details Name: Terry Johns MRN: [id number] DOB: Jun 05, 1956 Today's Date: 12/10/2016   History of Present Illness  s/p ablation of supraventricular tachycardia complicated by R groin hematoma requiring evacuation and repair of R superficial femoral artery.  Clinical Impression  Patient seen for evaluation, educated on mobility expectation sand safety. Patient mobilizing well, no further acute PT needs, will sign off.    Follow Up Recommendations No PT follow up    Equipment Recommendations  None recommended by PT    Recommendations for Other Services       Precautions / Restrictions Precautions Precautions: None Restrictions Weight Bearing Restrictions: No      Mobility  Bed Mobility               General bed mobility comments: pt received in chair  Transfers Overall transfer level: Modified independent               General transfer comment: pt slow to rise due to pain  Ambulation/Gait Ambulation/Gait assistance: Modified independent (Device/Increase time) Ambulation Distance (Feet): 280 Feet Assistive device: None Gait Pattern/deviations: Antalgic Gait velocity: decreased Gait velocity interpretation: Below normal speed for age/gender General Gait Details: steady with ambulation, modestly decreased cadence  Stairs            Wheelchair Mobility    Modified Rankin (Stroke Patients Only)       Balance Overall balance assessment: Modified Independent                                           Pertinent Vitals/Pain Pain Assessment: Faces Faces Pain Scale: Hurts little more Pain Location: R groin Pain Descriptors / Indicators: Sore Pain Intervention(s): Monitored during session    Home Living Family/patient expects to be discharged to:: Private residence Living Arrangements: Spouse/significant other Available Help at Discharge: Family;Available 24 hours/day Type of Home:  House Home Access: Stairs to enter Entrance Stairs-Rails: None Entrance Stairs-Number of Steps: 2 Home Layout: Two level;Able to live on main level with bedroom/bathroom Home Equipment: None      Prior Function Level of Independence: Independent         Comments: Pt works in [employer].     Hand Dominance   Dominant Hand: Right    Extremity/Trunk Assessment   Upper Extremity Assessment Upper Extremity Assessment: Overall WFL for tasks assessed    Lower Extremity Assessment Lower Extremity Assessment: Defer to PT evaluation    Cervical / Trunk Assessment Cervical / Trunk Assessment: Normal  Communication   Communication: No difficulties  Cognition Arousal/Alertness: Awake/alert Behavior During Therapy: WFL for tasks assessed/performed Overall Cognitive Status: Within Functional Limits for tasks assessed                                        General Comments      Exercises     Assessment/Plan    PT Assessment Patent does not need any further PT services  PT Problem List         PT Treatment Interventions      PT Goals (Current goals can be found in the Care Plan section)  Acute Rehab PT Goals Patient Stated Goal: return to PLOF PT Goal Formulation: All assessment and education complete, DC therapy    Frequency  Barriers to discharge        Co-evaluation               End of Session   Activity Tolerance: Patient tolerated treatment well Patient left: in chair;with call bell/phone within reach;with family/visitor present Nurse Communication: Mobility status PT Visit Diagnosis: Pain Pain - Right/Left: Right    Time: 7062-3762 PT Time Calculation (min) (ACUTE ONLY): 16 min   Charges:   PT Evaluation $PT Eval Low Complexity: 1 Procedure     PT G Codes:        Alben Deeds, PT DPT  [phone]   Duncan Dull 12/10/2016, 11:45 AM

## 2016-12-10 NOTE — Progress Notes (Signed)
   VASCULAR SURGERY ASSESSMENT & PLAN:   1 Day Post-Op s/p: Evacuation of hematoma right groin and repair of right superficial femoral artery.  May transfer to 2 W.  Leave drain until drainage decreases.  Ambulate.   SUBJECTIVE:   Pain adequately controlled.  PHYSICAL EXAM:   Vitals:   12/09/16 2100 12/09/16 2115 12/09/16 2243 12/10/16 0230  BP:   104/73 109/68  Pulse: 85  88 92  Resp: 19  14 18   Temp:  97 F (36.1 C) 98.2 F (36.8 C) 98 F (36.7 C)  TempSrc:  Axillary Oral Oral  SpO2: 98%  100% 99%  Weight:      Height:       Incision looks good. Swelling in right groin is markedly improved. Good Doppler signals right foot. JP = 250 cc overnight.   LABS:   Lab Results  Component Value Date   WBC 8.6 12/10/2016   HGB 9.5 (L) 12/10/2016   HCT 26.5 (L) 12/10/2016   MCV 83.3 12/10/2016   PLT PENDING 12/10/2016   Lab Results  Component Value Date   CREATININE 0.75 12/10/2016   Lab Results  Component Value Date   INR 1.21 12/09/2016    PROBLEM LIST:    Active Problems:   Pseudoaneurysm of left femoral artery ([facility])   CURRENT MEDS:   . aspirin EC  81 mg Oral Daily  . cefUROXime (ZINACEF)  IV  1.5 g Intravenous Q12H  . docusate sodium  100 mg Oral Daily  . enoxaparin (LOVENOX) injection  30 mg Subcutaneous Q24H  . pantoprazole  40 mg Oral Daily  . promethazine        Gae Gallop Beeper: [phone] Office: [phone] 12/10/2016

## 2016-12-11 ENCOUNTER — Encounter: Payer: BLUE CROSS/BLUE SHIELD | Admitting: Gastroenterology

## 2016-12-11 NOTE — Progress Notes (Addendum)
[facility]  VASCULAR SURGERY ASSESSMENT & PLAN:   JP 25 cc last shift. D/C drain tomorrow if continues to decrease.  Probably home tomorrow.   Deitra Mayo, MD, FACS Beeper [phone] Office: [phone]  Subjective  - Walked yesterday, pain is decreasing some.    Objective 126/62 100 99.2 F (37.3 C) (Oral) 20 96%  Intake/Output Summary (Last 24 hours) at 12/11/16 0732 Last data filed at 12/11/16 0050  Gross per 24 hour  Intake                0 ml  Output              280 ml  Net             -280 ml    Feet warm to touch active range of motion and sensation intact. Right groin with dissipating ecchymosis, soft.   Incision intact and healing well. Heart SR  Assessment/Planning: POD # 2 Evacuation of hematoma right groin and repair of right superficial femoral artery. Drain output 280 cc last 24 hours.   Maintain drain. No PT/OT f/u recommended  Laurence Slate [facility] 12/11/2016 7:32 AM --  Laboratory Lab Results:  Recent Labs  12/09/16 1732  12/09/16 1931 12/10/16 0350  WBC 14.3*  --   --  8.6  HGB 10.6*  < > 9.2* 9.5*  HCT 31.1*  < > 27.0* 26.5*  PLT 205  --   --  119*  < > = values in this interval not displayed. BMET  Recent Labs  12/09/16 1732 12/09/16 1738 12/09/16 1931 12/10/16 0350  NA 138 137 138 135  K 4.3 4.1 5.0 4.3  CL 106 105  --  107  CO2 19*  --   --  22  GLUCOSE 233* 234*  --  138*  BUN 17 18  --  14  CREATININE 1.24 1.20  --  0.75  CALCIUM 8.3*  --   --  7.7*    COAG Lab Results  Component Value Date   INR 1.21 12/09/2016   No results found for: PTT

## 2016-12-12 ENCOUNTER — Encounter (HOSPITAL_COMMUNITY): Payer: Self-pay | Admitting: Vascular Surgery

## 2016-12-12 ENCOUNTER — Telehealth: Payer: Self-pay | Admitting: Vascular Surgery

## 2016-12-12 MED ORDER — OXYCODONE-ACETAMINOPHEN 5-325 MG PO TABS: 1.0000 | ORAL_TABLET | ORAL | 0 refills | Status: DC | PRN

## 2016-12-12 NOTE — Telephone Encounter (Signed)
Sched drain check 12/16/16 at 1:00 w/ PA. Sched MD appt 12/31/16 at 10:45. Spoke to pt to inform them of appts.

## 2016-12-12 NOTE — Discharge Summary (Signed)
[facility] Discharge Summary   Patient ID:  Terry Johns MRN: [id number] DOB/AGE: 62-Sep-1957 62 y.o.  Admit date: 12/09/2016 Discharge date: 12/12/2016 Date of Surgery: 12/09/2016 Surgeon: Surgeon(s): Angelia Mould, MD  Admission Diagnosis: Pseudoaneurysm of left femoral artery [facility]) [I72.4]  Discharge Diagnoses:  Pseudoaneurysm of left femoral artery ([place]) [I72.4]  Secondary Diagnoses: Past Medical History:  Diagnosis Date  . Cataract   . DYSPHAGIA UNSPECIFIED 05/28/2010  . Ehlers-Danlos disease   . 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 EXTERNAL HEMORRHOIDS 05/17/2009  . MITRAL VALVE PROLAPSE 05/17/2009    Procedure(s): Evacuation of Hematoma Right Groin, Repair of Right Superfiical Femoral Artery  Discharged Condition: good  HPI: Terry Johns is a 61 y.o. male, who underwent a radiofrequency ablation yesterday for supraventricular tachycardia. This was done via bilateral femoral approach. Patient tells me that they had some problems with the right groin when they were holding pressure.  He was admitted overnight for observation and went home this morning. Around 3:30 PM, he developed the sudden onset of pain and swelling in the right groin and was brought to the emergency department by EMS. He was evaluated by the emergency department and was found has significant swelling in the right groin. Vascular surgery was consulted.  He currently denies pain or paresthesias in the right lower extremity.   Hospital Course:  Terry Johns is a 61 y.o. male is S/P Right Procedure(s): Evacuation of Hematoma Right Groin, Repair of Right Superfiical Femoral Artery  JP drain out put continues, patient is stable.  Ambulating and tolerating PO's well.  By day 3 patient is independent with activity.  The JP drain has 200 cc in the last 24 hours.  We will discharge him home and he will f/u in the office for a nurse check and possible drain removal next  week.  F/U with Dr. Scot Dock in 2-3 weeks for incisional check and status.  Significant Diagnostic Studies: CBC Lab Results  Component Value Date   WBC 8.6 12/10/2016   HGB 9.5 (L) 12/10/2016   HCT 26.5 (L) 12/10/2016   MCV 83.3 12/10/2016   PLT 119 (L) 12/10/2016    BMET    Component Value Date/Time   NA 135 12/10/2016 0350   NA 140 12/01/2016 1003   K 4.3 12/10/2016 0350   CL 107 12/10/2016 0350   CO2 22 12/10/2016 0350   GLUCOSE 138 (H) 12/10/2016 0350   BUN 14 12/10/2016 0350   BUN 17 12/01/2016 1003   CREATININE 0.75 12/10/2016 0350   CALCIUM 7.7 (L) 12/10/2016 0350   GFRNONAA >60 12/10/2016 0350   GFRAA >60 12/10/2016 0350   COAG Lab Results  Component Value Date   INR 1.21 12/09/2016     Disposition:  Discharge to :Home Discharge Instructions    Call MD for:  redness, tenderness, or signs of infection (pain, swelling, bleeding, redness, odor or green/yellow discharge around incision site)    Complete by:  As directed    Call MD for:  severe or increased pain, loss or decreased feeling  in affected limb(s)    Complete by:  As directed    Call MD for:  temperature >100.5    Complete by:  As directed    Discharge instructions    Complete by:  As directed    Keep track of drain output.  Sponge bath until drain is removed.  After drain removal next week wait 24 hours after drain removal before showering.   Driving  Restrictions    Complete by:  As directed    No driving for 1 week   Increase activity slowly    Complete by:  As directed    Walk with assistance use walker or cane as needed   Lifting restrictions    Complete by:  As directed    No heavy  lifting for 3 weeks   Resume previous diet    Complete by:  As directed      Allergies as of 12/12/2016   No Known Allergies     Medication List    TAKE these medications   aspirin EC 81 MG tablet Take 1 tablet (81 mg total) by mouth daily. Start 12/10/16. Take for 4 weeks post procedure then  discontinue. Avoid NSAIDS while taking daily aspirin.   oxyCODONE-acetaminophen 5-325 MG tablet Commonly known as:  PERCOCET/ROXICET Take 1 tablet by mouth every 4 (four) hours as needed for moderate pain.      Verbal and written Discharge instructions given to the patient. Wound care per Discharge AVS Follow-up Information    Deitra Mayo, MD Follow up in 4 day(s).   Specialties:  Vascular Surgery, Cardiology Why:  office will call for appoint ment Contact information: [place] [address] [phone]           Signed: Laurence Slate [facility] 12/12/2016, 11:36 AM

## 2016-12-12 NOTE — Anesthesia Postprocedure Evaluation (Signed)
Anesthesia Post Note  Patient: Terry Johns  Procedure(s) Performed: Procedure(s) (LRB): Evacuation of Hematoma Right Groin, Repair of Right Superfiical Femoral Artery (Right)  Patient location during evaluation: PACU Anesthesia Type: General Level of consciousness: sedated and patient cooperative Pain management: pain level controlled Vital Signs Assessment: post-procedure vital signs reviewed and stable Respiratory status: spontaneous breathing Cardiovascular status: stable Anesthetic complications: no       Last Vitals:  Vitals:   12/11/16 2032 12/12/16 0458  BP: 137/77 124/69  Pulse: (!) 108 91  Resp: 18 18  Temp: 36.7 C 36.9 C    Last Pain:  Vitals:   12/12/16 0458  TempSrc: Oral  PainSc:                  Nolon Nations

## 2016-12-12 NOTE — Telephone Encounter (Signed)
-----   Message from Mena Goes, RN sent at 12/12/2016 10:36 AM EDT ----- Regarding: RE: 2-3 weeks Both are done here PA clinic for Drain if that helps ----- Message ----- From: Georgiann Mccoy Sent: 12/12/2016  10:07 AM To: Mena Goes, RN Subject: RE: 2-3 weeks                                  Is the drain check and removal sched here or the hosp?  ----- Message ----- From: Mena Goes, RN Sent: 12/12/2016   9:57 AM To: Loleta Rose Admin Pool Subject: 2-3 weeks                                        ----- Message ----- From: Ulyses Amor, PA-C Sent: 12/12/2016   7:46 AM To: Vvs Charge Pool  S/p evacuation right groin hematoma.  He is being discharged with JP drain and needs f/u on Tuesday 12/16/2016 for drain check and possible removal.  If daily out put is less than 30 CC it can be removed.  2-3 week f/u with Dr. Scot Dock

## 2016-12-12 NOTE — Progress Notes (Signed)
   VASCULAR SURGERY ASSESSMENT & PLAN:   3 Days Post-Op s/p: Evacuation of hematoma right groin and repair of superficial femoral artery.  JP had 200 cc in the last 24 hours. I will ask the nurses to teach him how to empty the JP and record the output.  ACUTE BLOOD LOSS ANEMIA: This is resolved. On the day of admission the patient required 2 units of packed red blood cells. Hemoglobin equals 9.5.  Okay for discharge today.  SUBJECTIVE:   No complaints.  PHYSICAL EXAM:   Vitals:   12/11/16 0338 12/11/16 1312 12/11/16 2032 12/12/16 0458  BP: 126/62 132/71 137/77 124/69  Pulse: 100 96 (!) 108 91  Resp: 20 18 18 18   Temp: 99.2 F (37.3 C) 98.9 F (37.2 C) 98.1 F (36.7 C) 98.5 F (36.9 C)  TempSrc: Oral Oral Oral Oral  SpO2: 96% 98% 95% 95%  Weight:      Height:       Right groin incision looks fine. Right foot warm and well perfused.  LABS:   Lab Results  Component Value Date   WBC 8.6 12/10/2016   HGB 9.5 (L) 12/10/2016   HCT 26.5 (L) 12/10/2016   MCV 83.3 12/10/2016   PLT 119 (L) 12/10/2016   Lab Results  Component Value Date   CREATININE 0.75 12/10/2016   Lab Results  Component Value Date   INR 1.21 12/09/2016   CBG (last 3)  No results for input(s): GLUCAP in the last 72 hours.  PROBLEM LIST:    Active Problems:   Pseudoaneurysm of left femoral artery (HCC)   CURRENT MEDS:   . aspirin EC  81 mg Oral Daily  . docusate sodium  100 mg Oral Daily  . enoxaparin (LOVENOX) injection  30 mg Subcutaneous Q24H  . pantoprazole  40 mg Oral Daily    Gae Gallop Beeper: [phone] Office: [phone] 12/12/2016

## 2016-12-12 NOTE — Progress Notes (Signed)
Discussed with the patient and all questioned fully answered. He will call me if any problems arise.  IVs removed. Telemetry removed, CCMD notified. Pt given instructions on drainage of JP drain and dressing changes. Pt verbalized and demonstrated understanding. Pt discharged with tape, gauze, and cups to measure drainage.   Pt discharged with paper Rx.  Fritz Pickerel, RN

## 2016-12-12 NOTE — Care Management Note (Addendum)
Case Management Note Previous CM note initiated by Zenon Mayo, RN--12/10/2016, 2:55 PM    Patient Details  Name: Terry Johns MRN: [id number] Date of Birth: 1956/06/18  Subjective/Objective:  From home with wife, s/p  Ablation  on 4/10 presents with hematoma.  Now POD 1 evacuation of hematoma right groin and repair of right superficial femoral artery.  PTA indep.  Has a PCP and medication coverage and transport at dc.  NCM will cont to follow for dc needs.                 Action/Plan: Pt tx from 4E to 2W on 12/10/16- CM to continue to follow  Expected Discharge Date:  12/12/16               Expected Discharge Plan:  Home/Self Care  In-House Referral:     Discharge planning Services  CM Consult  Post Acute Care Choice:  NA Choice offered to:  NA  DME Arranged:    DME Agency:     HH Arranged:    [place]:     Status of Service:  Completed, signed off  If discussed at [employer] of Stay Meetings, dates discussed:    Discharge Disposition: home/self care   Additional Comments:  12/12/16- 1020- Arkel Cartwright RN, CM- pt for d/c home today- no CM needs noted for discharge.   Dahlia Client [place], RN 12/12/2016, 10:24 AM [phone]

## 2016-12-13 LAB — BPAM RBC
Blood Product Expiration Date: 201804272359
Blood Product Expiration Date: 201804272359
Blood Product Expiration Date: 201804272359
Blood Product Expiration Date: 201804272359
Blood Product Expiration Date: 201804282359
Blood Product Expiration Date: 201804302359
ISSUE DATE / TIME: 201804101744
ISSUE DATE / TIME: 201804101840
ISSUE DATE / TIME: 201804101840
ISSUE DATE / TIME: 201804101840
ISSUE DATE / TIME: 201804112021
Unit Type and Rh: 6200
Unit Type and Rh: 6200
Unit Type and Rh: 6200
Unit Type and Rh: 6200
Unit Type and Rh: 9500
Unit Type and Rh: 9500

## 2016-12-13 LAB — TYPE AND SCREEN
ABO/RH(D): A POS
Antibody Screen: NEGATIVE
Unit division: 0
Unit division: 0
Unit division: 0
Unit division: 0
Unit division: 0
Unit division: 0

## 2016-12-15 ENCOUNTER — Encounter: Payer: BLUE CROSS/BLUE SHIELD | Admitting: Gastroenterology

## 2016-12-16 ENCOUNTER — Ambulatory Visit (INDEPENDENT_AMBULATORY_CARE_PROVIDER_SITE_OTHER): Payer: Self-pay | Admitting: Physician Assistant

## 2016-12-16 VITALS — BP 132/88 | HR 92 | Temp 98.5°F | Resp 16 | Ht 73.0 in | Wt 219.0 lb

## 2016-12-16 DIAGNOSIS — S7012XS Contusion of left thigh, sequela: Secondary | ICD-10-CM

## 2016-12-16 NOTE — Progress Notes (Signed)
  POST OPERATIVE OFFICE NOTE    CC:  F/u for surgery  HPI:  This is a 61 y.o. male who is s/p  PROCEDURE:  1. Evacuation of hematoma right groin 2. Repair of right superficial femoral artery   He has had no problems.  He has kept a record of drainage out put and t has been watery with less than 20 cc for the past 4 days.  He reports no fever or chills.   No Known Allergies  Current Outpatient Prescriptions  Medication Sig Dispense Refill  . aspirin EC 81 MG tablet Take 1 tablet (81 mg total) by mouth daily. Start 12/10/16. Take for 4 weeks post procedure then discontinue. Avoid NSAIDS while taking daily aspirin.     No current facility-administered medications for this visit.      ROS:  See HPI  Physical Exam:  Vitals:   12/16/16 1247  BP: 132/88  Pulse: 92  Resp: 16  Temp: 98.5 F (36.9 C)    Incision:  Well healing without recurrent hematoma.   Extremities:  Form thigh surrounding hematoma evacuation area.  Ecchymosis.  No residual hematoma.  Palpable DP pulse 2+ B Neuro: sensation intac.   Assessment/Plan:  This is a 61 y.o. male who is s/p: PROCEDURE:  1. Evacuation of hematoma right groin 2. Repair of right superficial femoral artery  Drain was removed.  Dry guaze placed.  When he stood up to change, dark red blood drained from the drain site.  A new dressing was placed with an ABD pad to protect his clothing.  No recurrent bloody drainage prior to him leaving the office through the dressing.  Daily 81 mg aspirin Keep dressing clean and dry for the next 24 hours and then he can shower.  He is planning a trip to [place] and wants to swim.  I discussed this with Dr. Bridgett Larsson he states to wait until the drain site is healed at least 2 days before attempting to get in a pool.  I advised he not get in a hot tub or the ocean unless it is healed.  He has a f/u visit with Dr. Scot Dock on 12/31/2016   Roxy Horseman PA-C [facility] [phone]  Clinic MD:  Bridgett Larsson

## 2016-12-23 ENCOUNTER — Telehealth: Payer: Self-pay

## 2016-12-23 ENCOUNTER — Encounter: Payer: Self-pay | Admitting: Vascular Surgery

## 2016-12-23 NOTE — Telephone Encounter (Signed)
Phone call from pt.  Reported right hip is tight and sore around to the buttocks, and has noted some warmth.  Stated the tightness is more obvious with walking.  Stated "the hip seems more weak."  Also, stated had fever of 100.1 last night; today temp. is 99.0.  Denied any drainage from the right groin.  Stated is in [place]., today, but returning tonight.  Appt. given for 2:30 PM with PA on 4/25.  Pt. agreed with plan.

## 2016-12-24 ENCOUNTER — Ambulatory Visit (HOSPITAL_COMMUNITY)
Admission: RE | Admit: 2016-12-24 | Discharge: 2016-12-24 | Disposition: A | Discharge status: Home / Self Care | Payer: BLUE CROSS/BLUE SHIELD | Source: Ambulatory Visit | Attending: Vascular Surgery | Admitting: Vascular Surgery

## 2016-12-24 ENCOUNTER — Other Ambulatory Visit: Payer: Self-pay | Admitting: *Deleted

## 2016-12-24 ENCOUNTER — Encounter: Payer: Self-pay | Admitting: Vascular Surgery

## 2016-12-24 ENCOUNTER — Ambulatory Visit (INDEPENDENT_AMBULATORY_CARE_PROVIDER_SITE_OTHER): Payer: Self-pay | Admitting: Physician Assistant

## 2016-12-24 VITALS — BP 127/77 | HR 92 | Temp 96.6°F | Resp 18 | Ht 73.5 in | Wt 212.0 lb

## 2016-12-24 DIAGNOSIS — R1905 Periumbilic swelling, mass or lump: Secondary | ICD-10-CM | POA: Insufficient documentation

## 2016-12-24 DIAGNOSIS — R1030 Lower abdominal pain, unspecified: Secondary | ICD-10-CM | POA: Diagnosis not present

## 2016-12-24 DIAGNOSIS — Z9889 Other specified postprocedural states: Secondary | ICD-10-CM | POA: Insufficient documentation

## 2016-12-24 DIAGNOSIS — M25551 Pain in right hip: Secondary | ICD-10-CM

## 2016-12-24 DIAGNOSIS — S7012XS Contusion of left thigh, sequela: Secondary | ICD-10-CM

## 2016-12-24 NOTE — Progress Notes (Addendum)
  POST OPERATIVE OFFICE NOTE    CC:  F/u for surgery  HPI:  This is a 61 y.o. male who is s/p  PROCEDURE: 1. Evacuation of hematoma right groin 2. Repair of right superficial femoral artery  He called with concerns of continued swelling and hip pain on the right. He was seen 12/16/2016 at which time the JP drain was pulled.  He reports no drainage, ecchymosis or erythema.  He states he did have a temp last night of 99.  He is walking with right hip stiffness/pain.    No Known Allergies  Current Outpatient Prescriptions  Medication Sig Dispense Refill  . aspirin EC 81 MG tablet Take 1 tablet (81 mg total) by mouth daily. Start 12/10/16. Take for 4 weeks post procedure then discontinue. Avoid NSAIDS while taking daily aspirin.     No current facility-administered medications for this visit.       Physical Exam:  Vitals:   12/24/16 1428  BP: 127/77  Pulse: 92  Resp: 18  Temp: (!) 96.6 F (35.9 C)    Incision:  Well healed without drainage or erythema.  The posterior lateral hip area is firm and tender to touch.  No ecchymosis and no fluctuance.  He has a palpable DP/PT pulse B 2+.  No edema in the leg other than the hip are. Sensation to lite touch intact equal B LE.   Assessment/Plan:  This is a 60 y.o. male who is s/p:  PROCEDURE: 1. Evacuation of hematoma right groin 2. Repair of right superficial femoral artery Residual hematoma.  Under steri conditions 3 cc lidocaine  was injected lateral hip SQ and attempted aspiration of the hematoma was unsuccessfully performed by Dr. Scot Dock.  He asked if PT was needed, because he has had to use his hand to get in and out of the car due to pain and a feeling of weakness.  We would be glad to refer him.  He wants to wait for a few weeks and decide.  He will call us if he has further concerns.  At this point he will f/u PRN.  He just needed reassurance.    He was given instruction to perform activity as he tolerates without  restrictions.   Laurence Slate [facility] PA-C [facility] [phone]  Clinic MD:  Scot Dock

## 2016-12-29 ENCOUNTER — Ambulatory Visit (INDEPENDENT_AMBULATORY_CARE_PROVIDER_SITE_OTHER): Payer: BLUE CROSS/BLUE SHIELD | Admitting: Cardiology

## 2016-12-29 ENCOUNTER — Encounter: Payer: Self-pay | Admitting: Cardiology

## 2016-12-29 VITALS — BP 122/80 | HR 102 | Ht 73.5 in | Wt 214.0 lb

## 2016-12-29 DIAGNOSIS — I493 Ventricular premature depolarization: Secondary | ICD-10-CM | POA: Diagnosis not present

## 2016-12-29 NOTE — Patient Instructions (Addendum)
Medication Instructions:  Your physician recommends that you continue on your current medications as directed. Please refer to the Current Medication list given to you today.  Labwork: None ordered  Testing/Procedures: None ordered  Follow-Up: Your physician recommends that you schedule a follow-up appointment in: 3 months with Dr. Camnitz.   -- If you need a refill on your cardiac medications before your next appointment, please call your pharmacy. --  Thank you for choosing [facility]!!   Haytham Maher, RN [phone]       

## 2016-12-29 NOTE — Progress Notes (Signed)
Electrophysiology Office Note   Date:  12/29/2016   ID:  Terry Johns, DOB 08-Sep-1955, MRN [id number]  PCP:  [name], MD  Cardiologist:  [name] Primary Electrophysiologist:  Earsel Shouse [name], MD    Chief Complaint  Patient presents with  . Follow-up    Post PVC ablation     History of Present Illness: Terry Johns is a 61 y.o. male who presents today for electrophysiology evaluation.   Terry Johns is a 61 y.o. male who is being seen today for the evaluation of PVCs at the request of [name], MD. Has a history Ehlers Danlos syndrome, mitral valve prolapse, PVCs and PACs.   In review of her note, he was driving through a parking lot in November and felt his eyes flutter and felt dizziness that lasted approximately one second. Eyes rolled back, LOC for a second or 2 he thinks. He pulled over and the symptoms completely resolved and then he continued driving. No chest pain, no other associated symptoms. Never had this happen before. He doesn't exercise as much as he would like, working in [employer]. No syncope. He may get shortness of breath with walking a flight of stairs but not frequently. No extensive smoking history. No other significant cardiac risk factors.  He had a Holter monitor on 11/03/16 which showed 20% of his beats as premature ventricular contractions (approximate 50,000 PVCs out of 258,000 beats). No PACs. He also had an echocardiogram which showed normal ejection fraction but severely dilated left atrium and pseudo-normal diastolic dysfunction, grade 2. Previous echocardiogram did show moderately dilated left atrium.  He had ablation of his PVCs which were occurring from the inferior posterior LV septum. He had a significant right femoral artery hematoma requiring surgical exploration. He had evacuation of a right groin hematoma as well as repair of the right superficial femoral artery. Today, he still has a rather sizable hematoma over his right  hip. He is otherwise feeling mildly fatigued but well. He does have a cough that has occurred since his hospitalization.   Past Medical History:  Diagnosis Date  . Cataract   . DYSPHAGIA UNSPECIFIED 05/28/2010  . Ehlers-Danlos disease   . 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 EXTERNAL HEMORRHOIDS 05/17/2009  . MITRAL VALVE PROLAPSE 05/17/2009   Past Surgical History:  Procedure Laterality Date  . COLONOSCOPY    . FEMORAL ARTERY EXPLORATION Right 12/09/2016   Procedure: Evacuation of Hematoma Right Groin, Repair of Right Superfiical Femoral Artery;  Surgeon: Angelia Mould, MD;  Location: [facility];  Service: Vascular;  Laterality: Right;  . PVC ABLATION  12/08/2016  . PVC ABLATION N/A 12/08/2016   Procedure: PVC Ablation;  Surgeon: Alesi Zachery [name], MD;  Location: [facility];  Service: Cardiovascular;  Laterality: N/A;  . SHOULDER ARTHROSCOPY WITH ROTATOR CUFF REPAIR Left 2017   "[name]"     Current Outpatient Prescriptions  Medication Sig Dispense Refill  . acetaminophen (TYLENOL) 325 MG tablet Take 650 mg by mouth every 6 (six) hours as needed for moderate pain.    Marland Kitchen aspirin EC 81 MG tablet Take 1 tablet (81 mg total) by mouth daily. Start 12/10/16. Take for 4 weeks post procedure then discontinue. Avoid NSAIDS while taking daily aspirin.     No current facility-administered medications for this visit.     Allergies:   Patient has no known allergies.   Social History:  The patient  reports that he has quit smoking. His smoking  use included Cigarettes. He has a 0.30 pack-year smoking history. He has never used smokeless tobacco. He reports that he does not drink alcohol or use drugs.   Family History:  The patient's family history includes Breast cancer in his mother; Cancer in his father; Diabetes in his father; Hypertension in his father.    ROS:  Please see the history of present illness.   Otherwise, review of systems is positive for fatigue,  hematoma.   All other systems are reviewed and negative.   PHYSICAL EXAM: VS:  BP 122/80   Pulse (!) 102   Ht 6' 1.5" (1.867 m)   Wt 214 lb (97.1 kg)   BMI 27.85 kg/m  , BMI Body mass index is 27.85 kg/m. GEN: Well nourished, well developed, in no acute distress  HEENT: normal  Neck: no JVD, carotid bruits, or masses Cardiac: RRR; no murmurs, rubs, or gallops,no edema  Respiratory:  clear to auscultation bilaterally, normal work of breathing GI: soft, nontender, nondistended, + BS MS: no deformity or atrophy  Skin: warm and dry, firm hematoma over her right groin Neuro:  Strength and sensation are intact Psych: euthymic mood, full affect   EKG:  EKG is ordered today. Personal review of the ekg ordered  shows sinus tachycardia, rate 102  Recent Labs: 03/26/2016: ALT 12 12/10/2016: BUN 14; Creatinine, Ser 0.75; Hemoglobin 9.5; Platelets 119; Potassium 4.3; Sodium 135    Lipid Panel     Component Value Date/Time   CHOL 199 03/26/2016   TRIG 179 (A) 03/26/2016   HDL 56 03/26/2016   LDLCALC 107 03/26/2016     Wt Readings from Last 3 Encounters:  12/29/16 214 lb (97.1 kg)  12/24/16 212 lb (96.2 kg)  12/16/16 219 lb (99.3 kg)      Other studies Reviewed: Additional studies/ records that were reviewed today include:  Echocardiogram 11/03/16: - Left ventricle: The cavity size was mildly dilated. There was mild concentric hypertrophy. Systolic function was normal. The estimated ejection fraction was in the range of 60% to 65%. Wall motion was normal; there were no regional wall motion abnormalities. Features are consistent with a pseudonormal left ventricular filling pattern, with concomitant abnormal relaxation and increased filling pressure (grade 2 diastolic dysfunction). Doppler parameters are consistent with indeterminate ventricular filling pressure. - Aortic valve: Transvalvular velocity was within the normal range. There was no stenosis. There  was no regurgitation. - Mitral valve: Transvalvular velocity was within the normal range. There was no evidence for stenosis. There was mild regurgitation. - Left atrium: The atrium was severely dilated. - Right ventricle: The cavity size was normal. Wall thickness was normal. Systolic function was normal. - Atrial septum: No defect or patent foramen ovale was identified by color flow Doppler. - Tricuspid valve: There was no regurgitation. - Pulmonary arteries: Systolic pressure was within the normal range. PA peak pressure: 14 mm Hg (S).  48 hr holter:  51,000 total PVCs out of 258,000 beats recorded (20%)  Rare ventricular run, 3 beats, 4 beats. Sinus rhythm underlying. No atrial fibrillation.    ASSESSMENT AND PLAN:  1.  PVCs: Roughly 20% of his total beats seen on his Holter monitor. Had ablation of the inferior posterior LV septum which was successful at the time. Unfortunately had a right groin hematoma which required surgery for repair. He is since been discharged from surgery clinic. We'll continue to monitor for recurrence of PVCs. We'll see him back in 3 months.  2. Mild mitral regurgitation: Currently asymptomatic. We'll  continue current management.    Current medicines are reviewed at length with the patient today.   The patient does not have concerns regarding his medicines.  The following changes were made today:  none  Labs/ tests ordered today include:  Orders Placed This Encounter  Procedures  . EKG 12-Lead     Disposition:   FU with Shanele Nissan 3 months  Signed, Axcel Horsch [name], MD  12/29/2016 4:25 PM     [address] [place] [place] [id number] [phone] (office) [phone] (fax)

## 2016-12-30 ENCOUNTER — Encounter: Payer: Self-pay | Admitting: Vascular Surgery

## 2016-12-30 ENCOUNTER — Ambulatory Visit (INDEPENDENT_AMBULATORY_CARE_PROVIDER_SITE_OTHER): Payer: Self-pay | Admitting: Physician Assistant

## 2016-12-30 ENCOUNTER — Telehealth: Payer: Self-pay

## 2016-12-30 VITALS — BP 117/76 | HR 93 | Temp 97.8°F | Resp 16 | Ht 73.5 in | Wt 213.0 lb

## 2016-12-30 DIAGNOSIS — L03115 Cellulitis of right lower limb: Secondary | ICD-10-CM

## 2016-12-30 MED ORDER — CEPHALEXIN 500 MG PO CAPS: 500.0000 mg | ORAL_CAPSULE | Freq: Three times a day (TID) | ORAL | 0 refills | Status: DC

## 2016-12-30 NOTE — Progress Notes (Signed)
  POST OPERATIVE OFFICE NOTE    CC:  F/u for surgery  HPI:  This is a 61 y.o. male who is s/p  PROCEDURE: 1. Evacuation of hematoma right groin 2. Repair of right superficial femoral artery 12/16/2016 at which time the JP drain was pulled. He has a new concern of increased edema and erythema at the drain site on the right thigh since Sunday.  He is walking some for exercise and is still concerned about the tightness over the lateral thigh on the right.  No fever or chills.  No Known Allergies  Current Outpatient Prescriptions  Medication Sig Dispense Refill  . acetaminophen (TYLENOL) 325 MG tablet Take 650 mg by mouth every 6 (six) hours as needed for moderate pain.    Marland Kitchen aspirin EC 81 MG tablet Take 1 tablet (81 mg total) by mouth daily. Start 12/10/16. Take for 4 weeks post procedure then discontinue. Avoid NSAIDS while taking daily aspirin.    Marland Kitchen loratadine (ALLERGY) 10 MG tablet Take 10 mg by mouth daily.    . cephALEXin (KEFLEX) 500 MG capsule Take 1 capsule (500 mg total) by mouth 3 (three) times daily. 30 capsule 0   No current facility-administered medications for this visit.      ROS:  See HPI  Physical Exam:  Vitals:   12/30/16 1417  BP: 117/76  Pulse: 93  Resp: 16  Temp: 97.8 F (36.6 C)    Incision:  Well healed.  Drain site has erythema, no drainage, with everted skin.  The area is slightly tender to palpation.   Extremities:  Right lateral thigh is firm over the residual hematoma.  Non tender to palpation.  Palpable DP/PT pulse B 2+.  No edema in the right LE.   Assessment/Plan:  This is a 61 y.o. male who is s/p: PROCEDURE: 1. Evacuation of hematoma right groin 2. Repair of right superficial femoral artery  Cellulitis at drain site exit. We will place him on oral Keflex 500 mg TID for 10 days. He can continued to walk for exercise as he tolerates.  Dr. Donnetta Hutching came into the room for an examination and he agrees to the plan. He will f/u with Dr. Scot Dock in  2 weeks.   Laurence Slate [facility] PA-C [facility] [phone]  Clinic MD:  Early

## 2016-12-30 NOTE — Telephone Encounter (Signed)
Pt. Called to report the drain site is red, raised and voiced concern for infection.  Stated he experienced sweating during the night.  Requested site to be checked.  Appt. given for 2:00 PM today with PA.  Pt. agreed w/ plan.

## 2016-12-31 ENCOUNTER — Encounter: Payer: BLUE CROSS/BLUE SHIELD | Admitting: Vascular Surgery

## 2017-01-13 ENCOUNTER — Encounter: Payer: Self-pay | Admitting: Vascular Surgery

## 2017-01-21 ENCOUNTER — Encounter: Payer: Self-pay | Admitting: Vascular Surgery

## 2017-01-21 ENCOUNTER — Ambulatory Visit (INDEPENDENT_AMBULATORY_CARE_PROVIDER_SITE_OTHER): Payer: Self-pay | Admitting: Vascular Surgery

## 2017-01-21 VITALS — BP 108/80 | HR 80 | Temp 97.0°F | Resp 16 | Ht 73.5 in | Wt 209.0 lb

## 2017-01-21 DIAGNOSIS — Z48812 Encounter for surgical aftercare following surgery on the circulatory system: Secondary | ICD-10-CM

## 2017-01-21 NOTE — Progress Notes (Signed)
   Patient name: Terry Johns MRN: [id number] DOB: 1955-09-20 Sex: male  REASON FOR VISIT:    Follow up after evacuation of hematoma in the right groin and repair of the superficial femoral artery.  HPI:   Terry Johns is a 61 y.o. male who underwent radiofrequency ablation via bilateral femoral approaches. He went home but presented with sudden onset of pain and swelling in the right groin and was taken urgently to the operating room for evacuation of a hematoma in the right groin and repair of the right superficial femoral artery. This was on 12/09/2016. He comes in for a follow up visit.  The patient was seen in our office by morning Collins on 12/24/2016. The patient had a hematoma on the lateral hip on the right and I tried to aspirate this but the hematoma was solid and not liquefied. The patient was then seen again on 12/30/2016 with some cellulitis at the drain site and was started on Keflex. He comes in for 2 week visit.  He states that the cellulitis has significantly improved. He denies fever or chills.  Current Outpatient Prescriptions  Medication Sig Dispense Refill  . acetaminophen (TYLENOL) 325 MG tablet Take 650 mg by mouth every 6 (six) hours as needed for moderate pain.    Marland Kitchen loratadine (ALLERGY) 10 MG tablet Take 10 mg by mouth daily.     No current facility-administered medications for this visit.     REVIEW OF SYSTEMS:  [X]  denotes positive finding, [ ]  denotes negative finding Cardiac  Comments:  Chest pain or chest pressure:    Shortness of breath upon exertion:    Short of breath when lying flat:    Irregular heart rhythm:    Constitutional    Fever or chills:     PHYSICAL EXAM:   Vitals:   01/21/17 1133  BP: 108/80  Pulse: 80  Resp: 16  Temp: 97 F (36.1 C)  TempSrc: Oral  SpO2: 96%  Weight: 209 lb (94.8 kg)  Height: 6' 1.5" (1.867 m)    GENERAL: The patient is a well-nourished male, in no acute distress. The vital signs are documented  above. CARDIOVASCULAR: There is a regular rate and rhythm. PULMONARY: There is good air exchange bilaterally without wheezing or rales. The patient has a persistent hematoma on the right hip with no significant erythema currently. This is not pulsatile.  DATA:   No new data.  MEDICAL ISSUES:   STATUS POST EVACUATION OF HEMATOMA RIGHT GROIN AND REPAIR OF FEMORAL ARTERY: I think the hematoma on the lateral hip will take at least 6-8 weeks to resolve. He understands that. He is agreeable to simply be seen as needed. He will call the has any concerns such as cellulitis or drainage.  Deitra Mayo [facility] [phone]

## 2017-02-16 ENCOUNTER — Ambulatory Visit (INDEPENDENT_AMBULATORY_CARE_PROVIDER_SITE_OTHER): Payer: BLUE CROSS/BLUE SHIELD | Admitting: Family Medicine

## 2017-02-16 ENCOUNTER — Encounter: Payer: Self-pay | Admitting: Family Medicine

## 2017-02-16 VITALS — BP 120/84 | HR 91 | Temp 98.3°F | Wt 209.7 lb

## 2017-02-16 DIAGNOSIS — F411 Generalized anxiety disorder: Secondary | ICD-10-CM | POA: Diagnosis not present

## 2017-02-16 MED ORDER — SERTRALINE HCL 50 MG PO TABS: 50.0000 mg | ORAL_TABLET | Freq: Every day | ORAL | 11 refills | Status: DC

## 2017-02-16 NOTE — Patient Instructions (Signed)
Panic Attacks Panic attacks are sudden, short-livedsurges of severe anxiety, fear, or discomfort. They may occur for no reason when you are relaxed, when you are anxious, or when you are sleeping. Panic attacks may occur for a number of reasons:  Healthy people occasionally have panic attacks in extreme, life-threatening situations, such as war or natural disasters. Normal anxiety is a protective mechanism of the body that helps us react to danger (fight or flight response).  Panic attacks are often seen with anxiety disorders, such as panic disorder, social anxiety disorder, generalized anxiety disorder, and phobias. Anxiety disorders cause excessive or uncontrollable anxiety. They may interfere with your relationships or other life activities.  Panic attacks are sometimes seen with other mental illnesses, such as depression and posttraumatic stress disorder.  Certain medical conditions, prescription medicines, and drugs of abuse can cause panic attacks. What are the signs or symptoms? Panic attacks start suddenly, peak within 20 minutes, and are accompanied by four or more of the following symptoms:  Pounding heart or fast heart rate (palpitations).  Sweating.  Trembling or shaking.  Shortness of breath or feeling smothered.  Feeling choked.  Chest pain or discomfort.  Nausea or strange feeling in your stomach.  Dizziness, light-headedness, or feeling like you will faint.  Chills or hot flushes.  Numbness or tingling in your lips or hands and feet.  Feeling that things are not real or feeling that you are not yourself.  Fear of losing control or going crazy.  Fear of dying. Some of these symptoms can mimic serious medical conditions. For example, you may think you are having a heart attack. Although panic attacks can be very scary, they are not life threatening. How is this diagnosed? Panic attacks are diagnosed through an assessment by your health care provider. Your  health care provider will ask questions about your symptoms, such as where and when they occurred. Your health care provider will also ask about your medical history and use of alcohol and drugs, including prescription medicines. Your health care provider may order blood tests or other studies to rule out a serious medical condition. Your health care provider may refer you to a mental health professional for further evaluation. How is this treated?  Most healthy people who have one or two panic attacks in an extreme, life-threatening situation will not require treatment.  The treatment for panic attacks associated with anxiety disorders or other mental illness typically involves counseling with a mental health professional, medicine, or a combination of both. Your health care provider will help determine what treatment is best for you.  Panic attacks due to physical illness usually go away with treatment of the illness. If prescription medicine is causing panic attacks, talk with your health care provider about stopping the medicine, decreasing the dose, or substituting another medicine.  Panic attacks due to alcohol or drug abuse go away with abstinence. Some adults need professional help in order to stop drinking or using drugs. Follow these instructions at home:  Take all medicines as directed by your health care provider.  Schedule and attend follow-up visits as directed by your health care provider. It is important to keep all your appointments. Contact a health care provider if:  You are not able to take your medicines as prescribed.  Your symptoms do not improve or get worse. Get help right away if:  You experience panic attack symptoms that are different than your usual symptoms.  You have serious thoughts about hurting yourself or others.    You are taking medicine for panic attacks and have a serious side effect. This information is not intended to replace advice given to you by  your health care provider. Make sure you discuss any questions you have with your health care provider. Document Released: 08/18/2005 Document Revised: 01/24/2016 Document Reviewed: 04/01/2013 Elsevier Interactive Patient Education  2017 Elsevier Inc.  

## 2017-02-16 NOTE — Progress Notes (Signed)
Subjective:     Patient ID: Terry Johns, male   DOB: 1955-09-07, 61 y.o.   MRN: [id number]  HPI Patient here to discuss anxiety symptoms. He has been diagnosed with "panic attacks "in the past and for several years took sertraline which seemed to suppress them very well. He's had more frequent symptoms over the past several weeks.  His recent history is that he was seen in November with very transient possible presyncopal type symptoms. He was sent for further evaluation and Holter monitor showed about 20% of his heartbeats as PVCs. He underwent ablation procedure for his PVCs in April complicated by bleed with large hematoma of the right groin. He had to go repair of the right superficial femoral artery and has done well since that time other than anxiety symptoms above.  Anxiety symptoms tend to come on without provocation and could be in any setting. Frequently occur at home. He has some vague sensation of sense of impending doom with severe anxiety symptoms which has usually lasted several minutes. These are typical of symptoms he had in the past with his panic attacks. He's not had any chest pain or dyspnea. No clear provoking factors. Took sertraline before without any adverse events  Denies any specific stressors.  Past Medical History:  Diagnosis Date  . Cataract   . DYSPHAGIA UNSPECIFIED 05/28/2010  . Ehlers-Danlos disease   . 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 EXTERNAL HEMORRHOIDS 05/17/2009  . MITRAL VALVE PROLAPSE 05/17/2009   Past Surgical History:  Procedure Laterality Date  . COLONOSCOPY    . FEMORAL ARTERY EXPLORATION Right 12/09/2016   Procedure: Evacuation of Hematoma Right Groin, Repair of Right Superfiical Femoral Artery;  Surgeon: Angelia Mould, MD;  Location: [place];  Service: Vascular;  Laterality: Right;  . PVC ABLATION  12/08/2016  . PVC ABLATION N/A 12/08/2016   Procedure: PVC Ablation;  Surgeon: Will [name], MD;  Location: [place] [facility];   Service: Cardiovascular;  Laterality: N/A;  . SHOULDER ARTHROSCOPY WITH ROTATOR CUFF REPAIR Left 2017   "[name]"    reports that he has quit smoking. His smoking use included Cigarettes. He has a 0.30 pack-year smoking history. He has never used smokeless tobacco. He reports that he does not drink alcohol or use drugs. family history includes Breast cancer in his mother; Cancer in his father; Diabetes in his father; Hypertension in his father. No Known Allergies   Review of Systems  Constitutional: Negative for fatigue and unexpected weight change.  Eyes: Negative for visual disturbance.  Respiratory: Negative for cough, chest tightness and shortness of breath.   Cardiovascular: Negative for chest pain, palpitations and leg swelling.  Endocrine: Negative for polydipsia and polyuria.  Neurological: Negative for dizziness, syncope, weakness, light-headedness and headaches.  Psychiatric/Behavioral: Negative for agitation, confusion, dysphoric mood and sleep disturbance.       Objective:   Physical Exam  Constitutional: He is oriented to person, place, and time. He appears well-developed and well-nourished.  Cardiovascular: Normal rate and regular rhythm.   Murmur heard. Pulmonary/Chest: Effort normal and breath sounds normal. No respiratory distress. He has no wheezes. He has no rales.  Musculoskeletal: He exhibits no edema.  Neurological: He is alert and oriented to person, place, and time. No cranial nerve deficit.  Psychiatric: He has a normal mood and affect. His behavior is normal. Judgment and thought content normal.       Assessment:     Very transient anxiety symptoms as above. Similar  symptoms in the past. Suspect panic disorder. These are occurring much more frequently in recent weeks    Plan:     - Start back Sertraline 50 mg once daily. Give feedback in 3-4 weeks -especially if these are not adequately suppressed.  [name] MD [facility]

## 2017-03-16 ENCOUNTER — Encounter: Payer: Self-pay | Admitting: *Deleted

## 2017-03-31 ENCOUNTER — Encounter: Payer: Self-pay | Admitting: Cardiology

## 2017-03-31 ENCOUNTER — Ambulatory Visit (INDEPENDENT_AMBULATORY_CARE_PROVIDER_SITE_OTHER): Payer: BLUE CROSS/BLUE SHIELD | Admitting: Cardiology

## 2017-03-31 VITALS — BP 122/86 | HR 76 | Ht 74.0 in | Wt 208.4 lb

## 2017-03-31 DIAGNOSIS — I493 Ventricular premature depolarization: Secondary | ICD-10-CM | POA: Diagnosis not present

## 2017-03-31 NOTE — Progress Notes (Signed)
Electrophysiology Office Note   Date:  03/31/2017   ID:  Terry Johns, DOB 08-Jul-1956, MRN [id number]  PCP:  [name], MD  Cardiologist:  [name] Primary Electrophysiologist:  [name] [name], MD    Chief Complaint  Patient presents with  . Follow-up    PVC's     History of Present Illness: Terry Johns is a 61 y.o. male who presents today for electrophysiology evaluation.   Terry Johns is a 61 y.o. male who is being seen today for the evaluation of PVCs at the request of [name], MD. Has a history Ehlers Danlos syndrome, mitral valve prolapse, PVCs and PACs.   He had a Holter monitor on 11/03/16 which showed 20% of his beats as premature ventricular contractions (approximate 50,000 PVCs out of 258,000 beats). No PACs. He also had an echocardiogram which showed normal ejection fraction but severely dilated left atrium and pseudo-normal diastolic dysfunction, grade 2.   Today, denies symptoms of palpitations, chest pain, shortness of breath, orthopnea, PND, lower extremity edema, claudication, dizziness, presyncope, syncope, bleeding, or neurologic sequela. The patient is tolerating medications without difficulties and is otherwise without complaint today.     Past Medical History:  Diagnosis Date  . Cataract   . DYSPHAGIA UNSPECIFIED 05/28/2010  . Ehlers-Danlos disease   . 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 EXTERNAL HEMORRHOIDS 05/17/2009  . MITRAL VALVE PROLAPSE 05/17/2009   Past Surgical History:  Procedure Laterality Date  . COLONOSCOPY    . FEMORAL ARTERY EXPLORATION Right 12/09/2016   Procedure: Evacuation of Hematoma Right Groin, Repair of Right Superfiical Femoral Artery;  Surgeon: Angelia Mould, MD;  Location: [place];  Service: Vascular;  Laterality: Right;  . PVC ABLATION  12/08/2016  . PVC ABLATION N/A 12/08/2016   Procedure: PVC Ablation;  Surgeon: [name] [name], MD;  Location: [place] [facility];  Service:  Cardiovascular;  Laterality: N/A;  . SHOULDER ARTHROSCOPY WITH ROTATOR CUFF REPAIR Left 2017   "[name]"     Current Outpatient Prescriptions  Medication Sig Dispense Refill  . acetaminophen (TYLENOL) 325 MG tablet Take 650 mg by mouth every 6 (six) hours as needed for moderate pain.     No current facility-administered medications for this visit.     Allergies:   Patient has no known allergies.   Social History:  The patient  reports that he has quit smoking. His smoking use included Cigarettes. He has a 0.30 pack-year smoking history. He has never used smokeless tobacco. He reports that he does not drink alcohol or use drugs.   Family History:  The patient's family history includes Breast cancer in his mother; Cancer in his father; Diabetes in his father; Hypertension in his father.    ROS:  Please see the history of present illness.   Otherwise, review of systems is positive for Headaches, visual changes.   All other systems are reviewed and negative.   PHYSICAL EXAM: VS:  BP 122/86   Pulse 76   Ht 6\' 2"  (1.88 m)   Wt 208 lb 6.4 oz (94.5 kg)   BMI 26.76 kg/m  , BMI Body mass index is 26.76 kg/m. GEN: Well nourished, well developed, in no acute distress  HEENT: normal  Neck: no JVD, carotid bruits, or masses Cardiac: RRR; no murmurs, rubs, or gallops,no edema  Respiratory:  clear to auscultation bilaterally, normal work of breathing GI: soft, nontender, nondistended, + BS MS: no deformity or atrophy  Skin: warm  and dry Neuro:  Strength and sensation are intact Psych: euthymic mood, full affect  EKG:  EKG is not ordered today. Personal review of the ekg ordered 12/29/16 shows sinus rhythm, rate 102   Recent Labs: 12/10/2016: BUN 14; Creatinine, Ser 0.75; Hemoglobin 9.5; Platelets 119; Potassium 4.3; Sodium 135    Lipid Panel     Component Value Date/Time   CHOL 199 03/26/2016   TRIG 179 (A) 03/26/2016   HDL 56 03/26/2016   LDLCALC 107 03/26/2016      Wt Readings from Last 3 Encounters:  03/31/17 208 lb 6.4 oz (94.5 kg)  02/16/17 209 lb 11.2 oz (95.1 kg)  01/21/17 209 lb (94.8 kg)      Other studies Reviewed: Additional studies/ records that were reviewed today include:  Echocardiogram 11/03/16: - Left ventricle: The cavity size was mildly dilated. There was mild concentric hypertrophy. Systolic function was normal. The estimated ejection fraction was in the range of 60% to 65%. Wall motion was normal; there were no regional wall motion abnormalities. Features are consistent with a pseudonormal left ventricular filling pattern, with concomitant abnormal relaxation and increased filling pressure (grade 2 diastolic dysfunction). Doppler parameters are consistent with indeterminate ventricular filling pressure. - Aortic valve: Transvalvular velocity was within the normal range. There was no stenosis. There was no regurgitation. - Mitral valve: Transvalvular velocity was within the normal range. There was no evidence for stenosis. There was mild regurgitation. - Left atrium: The atrium was severely dilated. - Right ventricle: The cavity size was normal. Wall thickness was normal. Systolic function was normal. - Atrial septum: No defect or patent foramen ovale was identified by color flow Doppler. - Tricuspid valve: There was no regurgitation. - Pulmonary arteries: Systolic pressure was within the normal range. PA peak pressure: 14 mm Hg (S).  48 hr holter:  51,000 total PVCs out of 258,000 beats recorded (20%)  Rare ventricular run, 3 beats, 4 beats. Sinus rhythm underlying. No atrial fibrillation.    ASSESSMENT AND PLAN:  1.  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 Ablation complicated by left groin pseudoaneurysm and hematoma requiring vascular surgery. Now 6 months post procedure and feeling well. Significant decrease in PVC burden. No changes at this  time.  2. Mild mitral regurgitation: Currently asymptomatic. No changes at this time.    Current medicines are reviewed at length with the patient today.   The patient does not have concerns regarding his medicines.  The following changes were made today:    Labs/ tests ordered today include:  No orders of the defined types were placed in this encounter.    Disposition:   FU with Valon Glasscock 12 months  Signed, Tyna Huertas [name], MD  03/31/2017 10:00 AM     [facility] [place] [phone] (office) [phone] (fax)

## 2017-03-31 NOTE — Patient Instructions (Signed)
Medication Instructions:  Your physician recommends that you continue on your current medications as directed. Please refer to the Current Medication list given to you today.  * If you need a refill on your cardiac medications before your next appointment, please call your pharmacy.   Labwork: None ordered  Testing/Procedures: None ordered  Follow-Up: Your physician wants you to follow-up in: 1 year with Dr. Camnitz.  You will receive a reminder letter in the mail two months in advance. If you don't receive a letter, please call our office to schedule the follow-up appointment.  Thank you for choosing [facility]!!   Dagoberto Nealy, RN [phone]        

## 2017-04-28 ENCOUNTER — Telehealth: Payer: Self-pay | Admitting: Family Medicine

## 2017-04-28 MED ORDER — TADALAFIL 20 MG PO TABS: 20.0000 mg | ORAL_TABLET | ORAL | 5 refills | Status: DC | PRN

## 2017-04-28 NOTE — Telephone Encounter (Signed)
Rx sent to pharmacy   

## 2017-04-28 NOTE — Telephone Encounter (Signed)
May refill Cialis 20 mg one every other day as needed.  #6 with 5 refills.

## 2017-04-28 NOTE — Telephone Encounter (Signed)
Pt state that he was on Cialis years ago and would like to see if you could refill it.  Pharm: [facility] in [place]

## 2017-04-29 ENCOUNTER — Telehealth: Payer: Self-pay

## 2017-04-29 NOTE — Telephone Encounter (Signed)
Received PA request for Cialis. PA submitted & is pending. Key: [id number]

## 2017-05-01 MED ORDER — SILDENAFIL CITRATE 100 MG PO TABS: 50.0000 mg | ORAL_TABLET | Freq: Every day | ORAL | 5 refills | Status: DC | PRN

## 2017-05-01 NOTE — Telephone Encounter (Signed)
Rx sent in

## 2017-05-01 NOTE — Telephone Encounter (Signed)
I spoke with patient, he will try the Viagra.

## 2017-05-01 NOTE — Telephone Encounter (Signed)
PA denied. Patient has to try Viagra first.

## 2017-05-01 NOTE — Telephone Encounter (Signed)
Viagra 100 mg one half to one tablet once daily prn #6 with 5 refills.

## 2017-05-01 NOTE — Addendum Note (Signed)
Addended by: Kateri Mc E on: 05/01/2017 05:36 PM   Modules accepted: Orders

## 2017-05-01 NOTE — Telephone Encounter (Signed)
Let pt know this was denied by insurance.  I can prescribe Viagra if he wants.

## 2017-05-15 ENCOUNTER — Ambulatory Visit (INDEPENDENT_AMBULATORY_CARE_PROVIDER_SITE_OTHER): Payer: BLUE CROSS/BLUE SHIELD | Admitting: Family Medicine

## 2017-05-15 ENCOUNTER — Encounter: Payer: Self-pay | Admitting: Family Medicine

## 2017-05-15 VITALS — BP 110/70 | HR 82 | Temp 98.4°F | Wt 212.7 lb

## 2017-05-15 DIAGNOSIS — M6281 Muscle weakness (generalized): Secondary | ICD-10-CM

## 2017-05-15 DIAGNOSIS — R29898 Other symptoms and signs involving the musculoskeletal system: Secondary | ICD-10-CM

## 2017-05-15 NOTE — Progress Notes (Signed)
Subjective:     Patient ID: Terry Johns, male   DOB: [birth year], 61 y.o.   MRN: [id number]  HPI Patient is seen with concern for right thumb "weakness". He just noticed couple days ago. He's had mostly inability to flex the thumb at the DIP joint. He does not have any weakness with abduction or adduction. He also had some numbness of the right thumb and also complains of intermittent numbness involving the forearm and arm. He denies any neck pain. No injury. Right hand dominant. Denies history of similar problem. No lower extremity symptoms.  No exacerbating or alleviating conditions.  No wrist pain. Does spend quite a bit of time on computer.  Past Medical History:  Diagnosis Date  . Cataract   . DYSPHAGIA UNSPECIFIED 05/28/2010  . Ehlers-Danlos disease   . 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 EXTERNAL HEMORRHOIDS 05/17/2009  . MITRAL VALVE PROLAPSE 05/17/2009   Past Surgical History:  Procedure Laterality Date  . COLONOSCOPY    . FEMORAL ARTERY EXPLORATION Right 12/09/2016   Procedure: Evacuation of Hematoma Right Groin, Repair of Right Superfiical Femoral Artery;  Surgeon: Angelia Mould, MD;  Location: [place];  Service: Vascular;  Laterality: Right;  . PVC ABLATION  12/08/2016  . PVC ABLATION N/A 12/08/2016   Procedure: PVC Ablation;  Surgeon: Will [name], MD;  Location: [place] [facility];  Service: Cardiovascular;  Laterality: N/A;  . SHOULDER ARTHROSCOPY WITH ROTATOR CUFF REPAIR Left 2017   "[name]"    reports that he has quit smoking. His smoking use included Cigarettes. He has a 0.30 pack-year smoking history. He has never used smokeless tobacco. He reports that he does not drink alcohol or use drugs. family history includes Breast cancer in his mother; Cancer in his father; Diabetes in his father; Hypertension in his father. No Known Allergies   Review of Systems  Constitutional: Negative for appetite change, chills, fever and unexpected weight  change.  Respiratory: Negative for shortness of breath.   Cardiovascular: Negative for chest pain.  Musculoskeletal: Negative for back pain, myalgias, neck pain and neck stiffness.  Neurological: Positive for weakness and numbness.       Objective:   Physical Exam  Constitutional: He appears well-developed and well-nourished.  Cardiovascular: Normal rate and regular rhythm.   Pulmonary/Chest: Effort normal and breath sounds normal. No respiratory distress. He has no wheezes. He has no rales.  Musculoskeletal: He exhibits no edema.  No evidence for muscle atrophy upper extremities  Neurological:  Reflexes are 2+ biceps and brachioradialis bilaterally. He has normal sensory function to touch throughout. Patient has weakness of the right thumb with flexion at the DIP joint otherwise good range of motion. Grip strengths are full bilaterally. He has full strength biceps and triceps       Assessment:     Acute weakness and numbness involving the right thumb. He is predominantly complaining of inability to flex the DIP joint of thumb- but does note some intermittent numbness in the right upper extremity. Does not have any neck pain whatsoever.  ?flexor tendon injury/avulsion of thumb- but this would not explain any numbness.  Doubt carpal tunnel.    Plan:     -consider consult with hand surgeon. -may need NCVs and MRI cervical spine to further assess upper extremity paresthesias  [name] MD [facility] at [facility]

## 2017-05-15 NOTE — Patient Instructions (Signed)
We will be setting up nerve conduction testing of right upper extremity. Let me know if you have any progressive weakness or increasing pain.

## 2017-05-18 NOTE — Addendum Note (Signed)
Addended by: [name] on: 05/18/2017 08:36 AM   Modules accepted: Orders

## 2017-05-21 ENCOUNTER — Encounter: Payer: Self-pay | Admitting: Family Medicine

## 2017-06-08 ENCOUNTER — Ambulatory Visit (INDEPENDENT_AMBULATORY_CARE_PROVIDER_SITE_OTHER): Payer: BLUE CROSS/BLUE SHIELD | Admitting: Family Medicine

## 2017-06-08 ENCOUNTER — Encounter: Payer: Self-pay | Admitting: Family Medicine

## 2017-06-08 VITALS — BP 122/86 | HR 76 | Temp 97.8°F | Resp 16 | Ht 73.5 in | Wt 213.0 lb

## 2017-06-08 DIAGNOSIS — Z23 Encounter for immunization: Secondary | ICD-10-CM | POA: Diagnosis not present

## 2017-06-08 DIAGNOSIS — Z0000 Encounter for general adult medical examination without abnormal findings: Secondary | ICD-10-CM | POA: Diagnosis not present

## 2017-06-08 LAB — COMPREHENSIVE METABOLIC PANEL
ALT: 10 U/L (ref 0–53)
AST: 9 U/L (ref 0–37)
Albumin: 4 g/dL (ref 3.5–5.2)
Alkaline Phosphatase: 62 U/L (ref 39–117)
BUN: 13 mg/dL (ref 6–23)
CO2: 29 mEq/L (ref 19–32)
Calcium: 9.2 mg/dL (ref 8.4–10.5)
Chloride: 103 mEq/L (ref 96–112)
Creatinine, Ser: 0.84 mg/dL (ref 0.40–1.50)
GFR: 98.41 mL/min (ref >60.00)
Glucose, Bld: 127 mg/dL — ABNORMAL HIGH (ref 70–99)
Potassium: 4.4 mEq/L (ref 3.5–5.1)
Sodium: 138 mEq/L (ref 135–145)
Total Bilirubin: 0.6 mg/dL (ref 0.2–1.2)
Total Protein: 7 g/dL (ref 6.0–8.3)

## 2017-06-08 LAB — POCT URINALYSIS DIPSTICK
Bilirubin, UA: NEGATIVE
Blood, UA: NEGATIVE
Glucose, UA: NEGATIVE
Ketones, UA: NEGATIVE
Leukocytes, UA: NEGATIVE
Nitrite, UA: NEGATIVE
Protein, UA: NEGATIVE
Spec Grav, UA: >=1.03 — AB (ref 1.010–1.025)
Urobilinogen, UA: 0.2 E.U./dL
pH, UA: 6 (ref 5.0–8.0)

## 2017-06-08 LAB — PSA: PSA: 1.07 ng/mL (ref 0.10–4.00)

## 2017-06-08 LAB — CBC WITH DIFFERENTIAL/PLATELET
Basophils Absolute: 0 10*3/uL (ref 0.0–0.1)
Basophils Relative: 0.9 % (ref 0.0–3.0)
Eosinophils Absolute: 0.1 10*3/uL (ref 0.0–0.7)
Eosinophils Relative: 2.2 % (ref 0.0–5.0)
HCT: 43.4 % (ref 39.0–52.0)
Hemoglobin: 14.9 g/dL (ref 13.0–17.0)
Lymphocytes Relative: 26.6 % (ref 12.0–46.0)
Lymphs Abs: 1.3 10*3/uL (ref 0.7–4.0)
MCHC: 34.4 g/dL (ref 30.0–36.0)
MCV: 84.7 fl (ref 78.0–100.0)
Monocytes Absolute: 0.3 10*3/uL (ref 0.1–1.0)
Monocytes Relative: 7.3 % (ref 3.0–12.0)
Neutro Abs: 3 10*3/uL (ref 1.4–7.7)
Neutrophils Relative %: 63 % (ref 43.0–77.0)
Platelets: 216 10*3/uL (ref 150.0–400.0)
RBC: 5.13 Mil/uL (ref 4.22–5.81)
RDW: 14.8 % (ref 11.5–15.5)
WBC: 4.8 10*3/uL (ref 4.0–10.5)

## 2017-06-08 LAB — LIPID PANEL
Cholesterol: 198 mg/dL (ref 0–200)
HDL: 62.5 mg/dL (ref >39.00)
LDL Cholesterol: 114 mg/dL — ABNORMAL HIGH (ref 0–99)
NonHDL: 135.16
Total CHOL/HDL Ratio: 3
Triglycerides: 105 mg/dL (ref 0.0–149.0)
VLDL: 21 mg/dL (ref 0.0–40.0)

## 2017-06-08 LAB — HEMOGLOBIN A1C: Hgb A1c MFr Bld: 6.6 % — ABNORMAL HIGH (ref 4.6–6.5)

## 2017-06-08 LAB — TSH: TSH: 1.81 u[IU]/mL (ref 0.35–4.50)

## 2017-06-08 LAB — GAMMA GT: GGT: 20 U/L (ref 7–51)

## 2017-06-08 NOTE — Progress Notes (Signed)
Subjective:     Patient ID: Terry Johns, male   DOB: September 29, 1955, 61 y.o.   MRN: [id number]  HPI Patient seen for physical exam. He is applying for additional life insurance and brings in a form requesting specific testing including comprehensive metabolic panel, hemoglobin A1c, lipid panel, GGT level, HIV antibody, urinalysis, and carbohydrate deficient transferrin. He denies any alcohol use. He is a nonsmoker.  Insurance, he also requesting blood pressure every 5 minutes 3.  Patient needs follow-up colonoscopy and he plans to set that up. He has not had shingles vaccine. No hepatitis C screening. Due for repeat tetanus. Also needs flu vaccine. Expecting grandchild in February.  His chronic problems include history of Ehlers-Danlos syndrome, mitral valve prolapse, history of PVCs. 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 No syncope, dizziness, chest pain, or dyspnea.  Past Medical History:  Diagnosis Date  . Cataract   . DYSPHAGIA UNSPECIFIED 05/28/2010  . Ehlers-Danlos disease   . 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 EXTERNAL HEMORRHOIDS 05/17/2009  . MITRAL VALVE PROLAPSE 05/17/2009   Past Surgical History:  Procedure Laterality Date  . COLONOSCOPY    . FEMORAL ARTERY EXPLORATION Right 12/09/2016   Procedure: Evacuation of Hematoma Right Groin, Repair of Right Superfiical Femoral Artery;  Surgeon: Angelia Mould, MD;  Location: [facility];  Service: Vascular;  Laterality: Right;  . PVC ABLATION  12/08/2016  . PVC ABLATION N/A 12/08/2016   Procedure: PVC Ablation;  Surgeon: Will [name], MD;  Location: [facility];  Service: Cardiovascular;  Laterality: N/A;  . SHOULDER ARTHROSCOPY WITH ROTATOR CUFF REPAIR Left 2017   "[name]"    reports that he has quit smoking. His smoking use included Cigarettes. He has a 0.30 pack-year smoking history. He has never used smokeless tobacco. He reports that  he does not drink alcohol or use drugs. family history includes Breast cancer in his mother; Cancer in his father; Diabetes in his father; Hypertension in his father. No Known Allergies   Review of Systems  Constitutional: Negative for activity change, appetite change, fatigue, fever and unexpected weight change.  HENT: Negative for congestion, ear pain and trouble swallowing.   Eyes: Negative for pain and visual disturbance.  Respiratory: Negative for cough, shortness of breath and wheezing.   Cardiovascular: Negative for chest pain and palpitations.  Gastrointestinal: Negative for abdominal distention, abdominal pain, blood in stool, constipation, diarrhea, nausea, rectal pain and vomiting.  Endocrine: Negative for polydipsia and polyuria.  Genitourinary: Negative for dysuria, hematuria and testicular pain.  Musculoskeletal: Negative for arthralgias and joint swelling.  Skin: Negative for rash.  Neurological: Negative for dizziness, syncope and headaches.  Hematological: Negative for adenopathy.  Psychiatric/Behavioral: Negative for confusion and dysphoric mood.       Objective:   Physical Exam  Constitutional: He is oriented to person, place, and time. He appears well-developed and well-nourished. No distress.  HENT:  Head: Normocephalic and atraumatic.  Right Ear: External ear normal.  Left Ear: External ear normal.  Mouth/Throat: Oropharynx is clear and moist.  Eyes: Pupils are equal, round, and reactive to light. Conjunctivae and EOM are normal.  Neck: Normal range of motion. Neck supple. No thyromegaly present.  Cardiovascular: Normal rate, regular rhythm and normal heart sounds.  Exam reveals no gallop and no friction rub.   Pulmonary/Chest: No respiratory distress. He has no wheezes. He has no rales.  Abdominal: Soft. Bowel sounds are normal. He exhibits  no distension and no mass. There is no tenderness. There is no rebound and no guarding.  Musculoskeletal: He exhibits no  edema.  Lymphadenopathy:    He has no cervical adenopathy.  Neurological: He is alert and oriented to person, place, and time. He displays normal reflexes. No cranial nerve deficit.  Skin: No rash noted.  He has a small abraded area right chest wall from recent skin shave excision per dermatology. Healing well with no signs of secondary infection.  Psychiatric: He has a normal mood and affect.   Serial blood pressures every 5 minutes left arm seated (normal size cuff) as follows: 122/86, 122/84, 120/82    Assessment:     Physical exam. Patient requesting multiple lab test as above per life insurance company. Several issues addressed as below    Plan:     -Obtain screening labs -The natural history of prostate cancer and ongoing controversy regarding screening and potential treatment outcomes of prostate cancer has been discussed with the patient. The meaning of a false positive PSA and a false negative PSA has been discussed. He indicates understanding of the limitations of this screening test and wishes to proceed with screening PSA testing. -Flu vaccine and Tdap given. -Patient will set up her repeat colonoscopy. He has already been contacted by GI -Discussed new shingles vaccine and patient will consider  [name] MD [facility] at [facility]

## 2017-06-08 NOTE — Patient Instructions (Signed)
Set up repeat colonoscopy Consider new shingles vaccine- Shingrix Consider flu vaccine.

## 2017-06-10 LAB — CARBOHYDRATE DEFICIENT TRANSFERRIN
%CDT: 1.5 % (ref <2.5)
CDT: 39.1 mg/L (ref 28.1–76.0)
Transferrin: 254 mg/dL (ref 188–341)

## 2017-06-10 LAB — HIV ANTIBODY (ROUTINE TESTING W REFLEX): HIV 1&2 Ab, 4th Generation: NONREACTIVE

## 2017-06-10 LAB — HEPATITIS C ANTIBODY
Hepatitis C Ab: NONREACTIVE
SIGNAL TO CUT-OFF: 0.01 (ref <1.00)

## 2017-07-21 ENCOUNTER — Ambulatory Visit (INDEPENDENT_AMBULATORY_CARE_PROVIDER_SITE_OTHER): Payer: BLUE CROSS/BLUE SHIELD | Admitting: Family Medicine

## 2017-07-21 ENCOUNTER — Encounter: Payer: Self-pay | Admitting: Family Medicine

## 2017-07-21 VITALS — BP 110/78 | HR 74 | Temp 97.9°F | Wt 216.2 lb

## 2017-07-21 DIAGNOSIS — R739 Hyperglycemia, unspecified: Secondary | ICD-10-CM

## 2017-07-21 NOTE — Patient Instructions (Signed)
Diabetes Mellitus and Food It is important for you to manage your blood sugar (glucose) level. Your blood glucose level can be greatly affected by what you eat. Eating healthier foods in the appropriate amounts throughout the day at about the same time each day will help you control your blood glucose level. It can also help slow or prevent worsening of your diabetes mellitus. Healthy eating may even help you improve the level of your blood pressure and reach or maintain a healthy weight. General recommendations for healthful eating and cooking habits include:  Eating meals and snacks regularly. Avoid going long periods of time without eating to lose weight.  Eating a diet that consists mainly of plant-based foods, such as fruits, vegetables, nuts, legumes, and whole grains.  Using low-heat cooking methods, such as baking, instead of high-heat cooking methods, such as deep frying.  Work with your dietitian to make sure you understand how to use the Nutrition Facts information on food labels. How can food affect me? Carbohydrates Carbohydrates affect your blood glucose level more than any other type of food. Your dietitian will help you determine how many carbohydrates to eat at each meal and teach you how to count carbohydrates. Counting carbohydrates is important to keep your blood glucose at a healthy level, especially if you are using insulin or taking certain medicines for diabetes mellitus. Alcohol Alcohol can cause sudden decreases in blood glucose (hypoglycemia), especially if you use insulin or take certain medicines for diabetes mellitus. Hypoglycemia can be a life-threatening condition. Symptoms of hypoglycemia (sleepiness, dizziness, and disorientation) are similar to symptoms of having too much alcohol. If your health care provider has given you approval to drink alcohol, do so in moderation and use the following guidelines:  Women should not have more than one drink per day, and men  should not have more than two drinks per day. One drink is equal to: ? 12 oz of beer. ? 5 oz of wine. ? 1 oz of hard liquor.  Do not drink on an empty stomach.  Keep yourself hydrated. Have water, diet soda, or unsweetened iced tea.  Regular soda, juice, and other mixers might contain a lot of carbohydrates and should be counted.  What foods are not recommended? As you make food choices, it is important to remember that all foods are not the same. Some foods have fewer nutrients per serving than other foods, even though they might have the same number of calories or carbohydrates. It is difficult to get your body what it needs when you eat foods with fewer nutrients. Examples of foods that you should avoid that are high in calories and carbohydrates but low in nutrients include:  Trans fats (most processed foods list trans fats on the Nutrition Facts label).  Regular soda.  Juice.  Candy.  Sweets, such as cake, pie, doughnuts, and cookies.  Fried foods.  What foods can I eat? Eat nutrient-rich foods, which will nourish your body and keep you healthy. The food you should eat also will depend on several factors, including:  The calories you need.  The medicines you take.  Your weight.  Your blood glucose level.  Your blood pressure level.  Your cholesterol level.  You should eat a variety of foods, including:  Protein. ? Lean cuts of meat. ? Proteins low in saturated fats, such as fish, egg whites, and beans. Avoid processed meats.  Fruits and vegetables. ? Fruits and vegetables that may help control blood glucose levels, such as apples,   mangoes, and yams.  Dairy products. ? Choose fat-free or low-fat dairy products, such as milk, yogurt, and cheese.  Grains, bread, pasta, and rice. ? Choose whole grain products, such as multigrain bread, whole oats, and brown rice. These foods may help control blood pressure.  Fats. ? Foods containing healthful fats, such as  nuts, avocado, olive oil, canola oil, and fish.  Does everyone with diabetes mellitus have the same meal plan? Because every person with diabetes mellitus is different, there is not one meal plan that works for everyone. It is very important that you meet with a dietitian who will help you create a meal plan that is just right for you. This information is not intended to replace advice given to you by your health care provider. Make sure you discuss any questions you have with your health care provider. Document Released: 05/15/2005 Document Revised: 01/24/2016 Document Reviewed: 07/15/2013 Elsevier Interactive Patient Education  2017 Elsevier Inc.  

## 2017-07-21 NOTE — Progress Notes (Signed)
Subjective:     Patient ID: Terry Johns, male   DOB: 1956/08/01, 61 y.o.   MRN: [id number]  HPI Patient here to follow-up and discuss recent labs for physical. He had fasting blood sugar 127 with A1c of 6.6%. His father had type 2 diabetes. Patient's had prior history of prediabetes. Denies any polyuria or polydipsia. Urinalysis did not reveal any glucosuria. He does eat a fair amount of carbs especially breads. No consistent exercise. He prefers not to go on medication this time. No recent visual changes.  Past Medical History:  Diagnosis Date  . Cataract   . DYSPHAGIA UNSPECIFIED 05/28/2010  . Ehlers-Danlos disease   . 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 EXTERNAL HEMORRHOIDS 05/17/2009  . MITRAL VALVE PROLAPSE 05/17/2009   Past Surgical History:  Procedure Laterality Date  . COLONOSCOPY    . Evacuation of Hematoma Right Groin, Repair of Right Superfiical Femoral Artery Right 12/09/2016   Performed by Angelia Mould, MD at [facility]  . PVC ABLATION  12/08/2016  . PVC Ablation N/A 12/08/2016   Performed by Constance Haw, MD at [facility]  . SHOULDER ARTHROSCOPY WITH ROTATOR CUFF REPAIR Left 2017   "[name]"    reports that he has quit smoking. His smoking use included cigarettes. He has a 0.30 pack-year smoking history. he has never used smokeless tobacco. He reports that he does not drink alcohol or use drugs. family history includes Breast cancer in his mother; Cancer in his father; Diabetes in his father; Hypertension in his father. No Known Allergies   Review of Systems  Constitutional: Negative for fatigue.  Eyes: Negative for visual disturbance.  Respiratory: Negative for cough, chest tightness and shortness of breath.   Cardiovascular: Negative for chest pain, palpitations and leg swelling.  Endocrine: Negative for polydipsia and polyuria.  Neurological: Negative for dizziness, syncope, weakness, light-headedness and headaches.        Objective:   Physical Exam  Constitutional: He appears well-developed and well-nourished.  Cardiovascular: Normal rate and regular rhythm.  Pulmonary/Chest: Effort normal and breath sounds normal. No respiratory distress. He has no wheezes. He has no rales.       Assessment:     Hyperglycemia. Technically he is at the threshold for diagnosis of type 2 diabetes with recent vasculature 127 with A1c 6.6%    Plan:     -Discussed options for management. We offered referral to see certified diabetes educator and he declines this time -Reduce high glycemic foods with handout given -Establish more consistent exercise. We've have challenged him to try to lose some weight -We discussed potential options of medication such as metformin and he requests trial of lifestyle management first. Will reassess in 3 months and recheck A1c then  [name] MD [facility] at [facility]

## 2017-10-28 ENCOUNTER — Encounter: Payer: Self-pay | Admitting: Family Medicine

## 2017-10-28 ENCOUNTER — Ambulatory Visit (INDEPENDENT_AMBULATORY_CARE_PROVIDER_SITE_OTHER): Payer: BLUE CROSS/BLUE SHIELD | Admitting: Family Medicine

## 2017-10-28 VITALS — BP 120/80 | HR 76 | Temp 98.2°F | Wt 212.5 lb

## 2017-10-28 DIAGNOSIS — R7309 Other abnormal glucose: Secondary | ICD-10-CM

## 2017-10-28 DIAGNOSIS — E119 Type 2 diabetes mellitus without complications: Secondary | ICD-10-CM

## 2017-10-28 DIAGNOSIS — E1165 Type 2 diabetes mellitus with hyperglycemia: Secondary | ICD-10-CM | POA: Insufficient documentation

## 2017-10-28 LAB — POCT GLYCOSYLATED HEMOGLOBIN (HGB A1C): Hemoglobin A1C: 6.6

## 2017-10-28 NOTE — Patient Instructions (Signed)
Type 2 Diabetes Mellitus, Self Care, Adult Caring for yourself after you have been diagnosed with type 2 diabetes (type 2 diabetes mellitus) means keeping your blood sugar (glucose) under control with a balance of:  Nutrition.  Exercise.  Lifestyle changes.  Medicines or insulin, if necessary.  Support from your team of health care providers and others.  The following information explains what you need to know to manage your diabetes at home. What do I need to do to manage my blood glucose?  Check your blood glucose every day, as often as told by your health care provider.  Contact your health care provider if your blood glucose is above your target for 2 tests in a row.  Have your A1c (hemoglobin A1c) level checked at least two times a year, or as often as told by your health care provider. Your health care provider will set individualized treatment goals for you. Generally, the goal of treatment is to maintain the following blood glucose levels:  Before meals (preprandial): 80-130 mg/dL (4.4-7.2 mmol/L).  After meals (postprandial): below 180 mg/dL (10 mmol/L).  A1c level: less than 7%.  What do I need to know about hyperglycemia and hypoglycemia? What is hyperglycemia? Hyperglycemia, also called high blood glucose, occurs when blood glucose is too high.Make sure you know the early signs of hyperglycemia, such as:  Increased thirst.  Hunger.  Feeling very tired.  Needing to urinate more often than usual.  Blurry vision.  What is hypoglycemia? Hypoglycemia, also called low blood glucose, occurswith a blood glucose level at or below 70 mg/dL (3.9 mmol/L). The risk for hypoglycemia increases during or after exercise, during sleep, during illness, and when skipping meals or not eating for a long time (fasting). It is important to know the symptoms of hypoglycemia and treat it right away. Always have a 15-gram rapid-acting carbohydrate snack with you to treat low blood  glucose. Family members and close friends should also know the symptoms and should understand how to treat hypoglycemia, in case you are not able to treat yourself. What are the symptoms of hypoglycemia? Hypoglycemia symptoms can include:  Hunger.  Anxiety.  Sweating and feeling clammy.  Confusion.  Dizziness or feeling light-headed.  Sleepiness.  Nausea.  Increased heart rate.  Headache.  Blurry vision.  Seizure.  Nightmares.  Tingling or numbness around the mouth, lips, or tongue.  A change in speech.  Decreased ability to concentrate.  A change in coordination.  Restless sleep.  Tremors or shakes.  Fainting.  Irritability.  How do I treat hypoglycemia?  If you are alert and able to swallow safely, follow the 15:15 rule:  Take 15 grams of a rapid-acting carbohydrate. Rapid-acting options include: ? 1 tube of glucose gel. ? 3 glucose pills. ? 6-8 pieces of hard candy. ? 4 oz (120 mL) of fruit juice. ? 4 oz (120 mL) of regular (not diet) soda.  Check your blood glucose 15 minutes after you take the carbohydrate.  If the repeat blood glucose level is still at or below 70 mg/dL (3.9 mmol/L), take 15 grams of a carbohydrate again.  If your blood glucose level does not increase above 70 mg/dL (3.9 mmol/L) after 3 tries, seek emergency medical care.  After your blood glucose level returns to normal, eat a meal or a snack within 1 hour.  How do I treat severe hypoglycemia? Severe hypoglycemia is when your blood glucose level is at or below 54 mg/dL (3 mmol/L). Severe hypoglycemia is an emergency. Do not  wait to see if the symptoms will go away. Get medical help right away. Call your local emergency services (911 in the [place] Do not drive yourself to the hospital. If you have severe hypoglycemia and you cannot eat or drink, you may need an injection of glucagon. A family member or close friend should learn how to check your blood glucose and how to give you  a glucagon injection. Ask your health care provider if you need to have an emergency glucagon injection kit available. Severe hypoglycemia may need to be treated in a hospital. The treatment may include getting glucose through an IV tube. You may also need treatment for the cause of your hypoglycemia. Can having diabetes put me at risk for other conditions? Having diabetes can put you at risk for other long-term (chronic) conditions, such as heart disease and kidney disease. Your health care provider may prescribe medicines to help prevent complications from diabetes. These medicines may include:  Aspirin.  Medicine to lower cholesterol.  Medicine to control blood pressure.  What else can I do to manage my diabetes? Take your diabetes medicines as told  If your health care provider prescribed insulin or diabetes medicines, take them every day.  Do not run out of insulin or other diabetes medicines that you take. Plan ahead so you always have these available.  If you use insulin, adjust your dosage based on how physically active you are and what foods you eat. Your health care provider will tell you how to adjust your dosage. Make healthy food choices  The things that you eat and drink affect your blood glucose and your insulin dosage. Making good choices helps to control your diabetes and prevent other health problems. A healthy meal plan includes eating lean proteins, complex carbohydrates, fresh fruits and vegetables, low-fat dairy products, and healthy fats. Make an appointment to see a diet and nutrition specialist (registered dietitian) to help you create an eating plan that is right for you. Make sure that you:  Follow instructions from your health care provider about eating or drinking restrictions.  Drink enough fluid to keep your urine clear or pale yellow.  Eat healthy snacks between nutritious meals.  Track the carbohydrates that you eat. Do this by reading food labels and  learning the standard serving sizes of foods.  Follow your sick day plan whenever you cannot eat or drink as usual. Make this plan in advance with your health care provider.  Stay active  Exercise regularly, as told by your health care provider. This may include:  Stretching and doing strength exercises, such as yoga or weightlifting, at least 2 times a week.  Doing at least 150 minutes of moderate-intensity or vigorous-intensity exercise each week. This could be brisk walking, biking, or water aerobics. ? Spread out your activity over at least 3 days of the week. ? Do not go more than 2 days in a row without doing some kind of physical activity.  When you start a new exercise or activity, work with your health care provider to adjust your insulin, medicines, or food intake as needed. Make healthy lifestyle choices  Do not use any tobacco products, such as cigarettes, chewing tobacco, and e-cigarettes. If you need help quitting, ask your health care provider.  If your health care provider says that alcohol is safe for you, limit alcohol intake to no more than 1 drink per day for nonpregnant women and 2 drinks per day for men. One drink equals 12 oz of  beer, 5 oz of wine, or 1 oz of hard liquor.  Learn to manage stress. If you need help with this, ask your health care provider. Care for your body   Keep your immunizations up to date. In addition to getting vaccinations as told by your health care provider, it is recommended that you get vaccinated against the following illnesses: ? The flu (influenza). Get a flu shot every year. ? Pneumonia. ? Hepatitis B.  Schedule an eye exam soon after your diagnosis, and then one time every year after that.  Check your skin and feet every day for cuts, bruises, redness, blisters, or sores. Schedule a foot exam with your health care provider once every year.  Brush your teeth and gums two times a day, and floss at least one time a day. Visit your  dentist at least once every 6 months.  Maintain a healthy weight. General instructions  Take over-the-counter and prescription medicines only as told by your health care provider.  Share your diabetes management plan with people in your workplace, school, and household.  Check your urine for ketones when you are ill and as told by your health care provider.  Ask your health care provider: ? Do I need to meet with a diabetes educator? ? Where can I find a support group for people with diabetes?  Carry a medical alert card or wear medical alert jewelry.  Keep all follow-up visits as told by your health care provider. This is important. Where to find more information: For more information about diabetes, visit:  American Diabetes Association (ADA): www.diabetes.org  American Association of Diabetes Educators (AADE): www.diabeteseducator.org/patient-resources  This information is not intended to replace advice given to you by your health care provider. Make sure you discuss any questions you have with your health care provider. Document Released: 12/10/2015 Document Revised: 01/24/2016 Document Reviewed: 09/21/2015 Elsevier Interactive Patient Education  [employer].

## 2017-10-28 NOTE — Progress Notes (Signed)
Subjective:     Patient ID: Terry Johns, male   DOB: [birth year], 62 y.o.   MRN: [id number]  HPI Patient seen for follow-up regarding hyperglycemia. He had A1c back in the fall of 6.6%. He's not been exercising recently and has not really made any dietary changes. His weight is down about 4 pounds from last visit. Denies any polyuria or polydipsia. Not monitoring blood sugars. We offered diabetes education but he declined. He has a fairly good insight as to what he should do but states he eats too many desserts and breads.  No history of hypertension. He does have some interest in getting a home glucose monitor.  History of frequent PVCs but no significant cardiac symptoms recently  Past Medical History:  Diagnosis Date  . Cataract   . DYSPHAGIA UNSPECIFIED 05/28/2010  . Ehlers-Danlos disease   . 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 EXTERNAL HEMORRHOIDS 05/17/2009  . MITRAL VALVE PROLAPSE 05/17/2009   Past Surgical History:  Procedure Laterality Date  . COLONOSCOPY    . FEMORAL ARTERY EXPLORATION Right 12/09/2016   Procedure: Evacuation of Hematoma Right Groin, Repair of Right Superfiical Femoral Artery;  Surgeon: Angelia Mould, MD;  Location: [facility];  Service: Vascular;  Laterality: Right;  . PVC ABLATION  12/08/2016  . PVC ABLATION N/A 12/08/2016   Procedure: PVC Ablation;  Surgeon: Will [name], MD;  Location: [facility];  Service: Cardiovascular;  Laterality: N/A;  . SHOULDER ARTHROSCOPY WITH ROTATOR CUFF REPAIR Left 2017   "[name]"    reports that he has quit smoking. His smoking use included cigarettes. He has a 0.30 pack-year smoking history. he has never used smokeless tobacco. He reports that he does not drink alcohol or use drugs. family history includes Breast cancer in his mother; Cancer in his father; Diabetes in his father; Hypertension in his father. No Known Allergies   Review of Systems  Constitutional: Negative for fatigue and  unexpected weight change.  Eyes: Negative for visual disturbance.  Respiratory: Negative for cough, chest tightness and shortness of breath.   Cardiovascular: Negative for chest pain, palpitations and leg swelling.  Endocrine: Negative for polydipsia and polyuria.  Genitourinary: Negative for dysuria and frequency.  Neurological: Negative for dizziness, syncope, weakness, light-headedness and headaches.       Objective:   Physical Exam  Constitutional: He is oriented to person, place, and time. He appears well-developed and well-nourished.  HENT:  Right Ear: External ear normal.  Left Ear: External ear normal.  Mouth/Throat: Oropharynx is clear and moist.  Eyes: Pupils are equal, round, and reactive to light.  Neck: Neck supple. No thyromegaly present.  Cardiovascular: Normal rate and regular rhythm.  Pulmonary/Chest: Effort normal and breath sounds normal. No respiratory distress. He has no wheezes. He has no rales.  Musculoskeletal: He exhibits no edema.  Neurological: He is alert and oriented to person, place, and time.       Assessment:     Type 2 diabetes recently diagnosed. He's had a couple of A1c's 6.6% including today    Plan:     -Long discussion with patient regarding options. We discussed potential medication such as metformin but at this point he remains interested in lifestyle modification. We've stressed importance of getting started with regular exercise and recommended both resistance and aerobic components. Scale back sugar and starch intake -Home glucose monitor given and he will monitor fasting blood sugars once or twice per week -Schedule follow-up in 4 months to  reassess. If A1c not improving at that point consider metformin and he agrees -over 20 minutes spent with pt of which > 50% in direct counseling regarding long term risks of diabetes, importance of close monitoring, exercise, diet, and home glucose monitoring.  [name] MD [facility]

## 2017-12-25 ENCOUNTER — Telehealth: Payer: Self-pay | Admitting: *Deleted

## 2017-12-25 NOTE — Telephone Encounter (Signed)
Prior auth for Sildenafil 100mg  sent to [email or web address].

## 2018-03-31 NOTE — Progress Notes (Signed)
Electrophysiology Office Note   Date:  03/31/2018   ID:  Terry Johns, DOB 26-Jun-1956, MRN [id number]  PCP:  [name], MD  Cardiologist:  [name] Primary Electrophysiologist:  Constance Haw, MD    No chief complaint on file.    History of Present Illness: Terry Johns is a 62 y.o. male who presents today for electrophysiology evaluation.   Terry Johns is a 62 y.o. male who is being seen today for the evaluation of PVCs at the request of [name], MD. Has a history Ehlers Danlos syndrome, mitral valve prolapse, PVCs and PACs.   He had a Holter monitor on 11/03/16 which showed 20% of his beats as premature ventricular contractions (approximate 50,000 PVCs out of 258,000 beats). No PACs. He also had an echocardiogram which showed normal ejection fraction but severely dilated left atrium and pseudo-normal diastolic dysfunction, grade 2.   Today, denies symptoms of palpitations, chest pain, shortness of breath, orthopnea, PND, lower extremity edema, claudication, dizziness, presyncope, syncope, bleeding, or neurologic sequela. The patient is tolerating medications without difficulties.  Overall he is feeling well.  He has had no major complaints.  He is noted no further episodes of palpitations.    Past Medical History:  Diagnosis Date  . Cataract   . DYSPHAGIA UNSPECIFIED 05/28/2010  . Ehlers-Danlos disease   . 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 EXTERNAL HEMORRHOIDS 05/17/2009  . MITRAL VALVE PROLAPSE 05/17/2009   Past Surgical History:  Procedure Laterality Date  . COLONOSCOPY    . FEMORAL ARTERY EXPLORATION Right 12/09/2016   Procedure: Evacuation of Hematoma Right Groin, Repair of Right Superfiical Femoral Artery;  Surgeon: Angelia Mould, MD;  Location: [facility];  Service: Vascular;  Laterality: Right;  . PVC ABLATION  12/08/2016  . PVC ABLATION N/A 12/08/2016   Procedure: PVC Ablation;  Surgeon: Deep Bonawitz [name], MD;  Location: [facility];  Service: Cardiovascular;  Laterality: N/A;  . SHOULDER ARTHROSCOPY WITH ROTATOR CUFF REPAIR Left 2017   "[name]"     Current Outpatient Medications  Medication Sig Dispense Refill  . acetaminophen (TYLENOL) 325 MG tablet Take 650 mg by mouth every 6 (six) hours as needed for moderate pain.    . sildenafil (VIAGRA) 100 MG tablet Take 0.5-1 tablets (50-100 mg total) by mouth daily as needed for erectile dysfunction. 6 tablet 5   No current facility-administered medications for this visit.     Allergies:   Patient has no known allergies.   Social History:  The patient  reports that he has quit smoking. His smoking use included cigarettes. He has a 0.30 pack-year smoking history. He has never used smokeless tobacco. He reports that he does not drink alcohol or use drugs.   Family History:  The patient's family history includes Breast cancer in his mother; Cancer in his father; Diabetes in his father; Hypertension in his father.    ROS:  Please see the history of present illness.   Otherwise, review of systems is positive for none.   All other systems are reviewed and negative.   PHYSICAL EXAM: VS:  There were no vitals taken for this visit. , BMI There is no height or weight on file to calculate BMI. GEN: Well nourished, well developed, in no acute distress  HEENT: normal  Neck: no JVD, carotid bruits, or masses Cardiac: RRR; no murmurs, rubs, or gallops,no edema  Respiratory:  clear to auscultation bilaterally, normal work of breathing GI:  soft, nontender, nondistended, + BS MS: no deformity or atrophy  Skin: warm and dry Neuro:  Strength and sensation are intact Psych: euthymic mood, full affect  EKG:  EKG is ordered today. Personal review of the ekg ordered shows SR, rate 71    Recent Labs: 06/08/2017: ALT 10; BUN 13; Creatinine, Ser 0.84; Hemoglobin 14.9; Platelets 216.0; Potassium 4.4; Sodium 138; TSH 1.81    Lipid Panel     Component Value  Date/Time   CHOL 198 06/08/2017 1211   TRIG 105.0 06/08/2017 1211   HDL 62.50 06/08/2017 1211   CHOLHDL 3 06/08/2017 1211   VLDL 21.0 06/08/2017 1211   LDLCALC 114 (H) 06/08/2017 1211     Wt Readings from Last 3 Encounters:  10/28/17 212 lb 8 oz (96.4 kg)  07/21/17 216 lb 3.2 oz (98.1 kg)  06/08/17 213 lb (96.6 kg)      Other studies Reviewed: Additional studies/ records that were reviewed today include:  Echocardiogram 11/03/16: - Left ventricle: The cavity size was mildly dilated. There was mild concentric hypertrophy. Systolic function was normal. The estimated ejection fraction was in the range of 60% to 65%. Wall motion was normal; there were no regional wall motion abnormalities. Features are consistent with a pseudonormal left ventricular filling pattern, with concomitant abnormal relaxation and increased filling pressure (grade 2 diastolic dysfunction). Doppler parameters are consistent with indeterminate ventricular filling pressure. - Aortic valve: Transvalvular velocity was within the normal range. There was no stenosis. There was no regurgitation. - Mitral valve: Transvalvular velocity was within the normal range. There was no evidence for stenosis. There was mild regurgitation. - Left atrium: The atrium was severely dilated. - Right ventricle: The cavity size was normal. Wall thickness was normal. Systolic function was normal. - Atrial septum: No defect or patent foramen ovale was identified by color flow Doppler. - Tricuspid valve: There was no regurgitation. - Pulmonary arteries: Systolic pressure was within the normal range. PA peak pressure: 14 mm Hg (S).  48 hr holter:  51,000 total PVCs out of 258,000 beats recorded (20%)  Rare ventricular run, 3 beats, 4 beats. Sinus rhythm underlying. No atrial fibrillation.    ASSESSMENT AND PLAN:  1.  PVCs: 20% PVCs on his Holter monitor.  Is now status post ablation.  Ablation was  complicated by left groin pseudoaneurysm and hematoma requiring vascular surgery.  He is having no further symptoms and no PVCs on his EKG.  No changes at this time.    2. Mild mitral regurgitation: Currently asymptomatic.  No changes.   Current medicines are reviewed at length with the patient today.   The patient does not have concerns regarding his medicines.  The following changes were made today: None  Labs/ tests ordered today include:  No orders of the defined types were placed in this encounter.  Case discussed with primary cardiology  Disposition:   FU with Margit Batte as needed months  Signed, Alexxus Sobh [name], MD  03/31/2018 2:22 PM     [facility] [address] [place] [address] [phone] (office) [phone] (fax)

## 2018-04-01 ENCOUNTER — Ambulatory Visit (INDEPENDENT_AMBULATORY_CARE_PROVIDER_SITE_OTHER): Payer: BLUE CROSS/BLUE SHIELD | Admitting: Cardiology

## 2018-04-01 ENCOUNTER — Encounter: Payer: Self-pay | Admitting: Cardiology

## 2018-04-01 VITALS — BP 126/88 | HR 71 | Ht 73.5 in | Wt 221.0 lb

## 2018-04-01 DIAGNOSIS — I340 Nonrheumatic mitral (valve) insufficiency: Secondary | ICD-10-CM | POA: Diagnosis not present

## 2018-04-01 DIAGNOSIS — I493 Ventricular premature depolarization: Secondary | ICD-10-CM

## 2018-04-01 NOTE — Patient Instructions (Signed)
Medication Instructions:  Your physician recommends that you continue on your current medications as directed. Please refer to the Current Medication list given to you today.  * If you need a refill on your cardiac medications before your next appointment, please call your pharmacy.   Labwork: None ordered  Testing/Procedures: None ordered  Follow-Up: No follow up is needed at this time with Dr. Curt Bears.  He will see you on an as needed basis.   *Please note that any paperwork needing to be filled out by the provider will need to be addressed at the front desk prior to seeing the provider. Please note that any FMLA, disability or other documents regarding health condition is subject to a $25.00 charge that must be received prior to completion of paperwork in the form of a money order or check.  Thank you for choosing [facility]!!   Trinidad Curet, RN [phone]

## 2018-05-23 ENCOUNTER — Other Ambulatory Visit: Payer: Self-pay | Admitting: Family Medicine

## 2018-07-01 ENCOUNTER — Telehealth: Payer: Self-pay | Admitting: *Deleted

## 2018-07-01 NOTE — Telephone Encounter (Signed)
Copied from [place] [id number]. Topic: General - Other >> Jul 01, 2018  4:35 PM Keene Breath wrote: Reason for CRM: Patient called to speak with the nurse regarding some Acu Strips.  Patient stated that the nurse said that he could call the office and they could give him some strips.  Please advise.  CB# [phone]

## 2018-07-02 NOTE — Telephone Encounter (Signed)
Called patient and left a detailed voice message to let him know that we do not have any samples of test strips in our office unfortunately. I also advised to contact his pharmacy to send [place] a refill or contact us if he has questions.  OK for PEC to discuss if patient calls back.   CRM Created.

## 2018-08-30 ENCOUNTER — Telehealth: Payer: Self-pay | Admitting: Family Medicine

## 2018-08-30 NOTE — Telephone Encounter (Signed)
Copied from [place] [id number]. Topic: Quick Communication - Rx Refill/Question >> Aug 30, 2018 12:14 PM Burchel, Abbi R wrote: Medication: accu check model 923 test strips   Preferred Pharmacy: [id number] - [place], [place] - [address] [place] [address] Phone: [phone] Fax: [phone]   Pt was advised that RX refills may take up to 3 business days. We ask that you follow-up with your pharmacy.

## 2018-08-30 NOTE — Telephone Encounter (Signed)
Requested medication not on current med list.  LOV: 10/28/17

## 2018-08-31 ENCOUNTER — Other Ambulatory Visit: Payer: Self-pay

## 2018-08-31 MED ORDER — GLUCOSE BLOOD VI STRP: ORAL_STRIP | 0 refills | Status: DC

## 2018-08-31 NOTE — Telephone Encounter (Signed)
Strips have been sent per OV notes from 10/2017 with a note to please schedule his follow up appointment for further refills.

## 2018-10-05 ENCOUNTER — Other Ambulatory Visit: Payer: Self-pay

## 2019-05-11 ENCOUNTER — Telehealth: Payer: BLUE CROSS/BLUE SHIELD | Admitting: Family

## 2019-05-11 DIAGNOSIS — J208 Acute bronchitis due to other specified organisms: Secondary | ICD-10-CM | POA: Diagnosis not present

## 2019-05-11 MED ORDER — PREDNISONE 10 MG (21) PO TBPK
ORAL_TABLET | ORAL | 0 refills | Status: DC
Start: 2019-05-11 — End: 2019-10-05

## 2019-05-11 NOTE — Progress Notes (Signed)
We are sorry that you are not feeling well.  Here is how we plan to help!  Based on your presentation I believe you most likely have A cough due to a virus.  This is called viral bronchitis and is best treated by rest, plenty of fluids and control of the cough.  You may use Ibuprofen or Tylenol as directed to help your symptoms.     In addition you may use A non-prescription cough medication called Robitussin DAC. Take 2 teaspoons every 8 hours or Delsym: take 2 teaspoons every 12 hours., A non-prescription cough medication called Mucinex DM: take 2 tablets every 12 hours. and A prescription cough medication called Tessalon Perles 100mg . You may take 1-2 capsules every 8 hours as needed for your cough.  Prednisone 10 mg daily for 6 days (see taper instructions below) You need to be on a strict low carb diet. Your last A1C was at goal.   Directions for 6 day taper: Day 1: 2 tablets before breakfast, 1 after both lunch & dinner and 2 at bedtime Day 2: 1 tab before breakfast, 1 after both lunch & dinner and 2 at bedtime Day 3: 1 tab at each meal & 1 at bedtime Day 4: 1 tab at breakfast, 1 at lunch, 1 at bedtime Day 5: 1 tab at breakfast & 1 tab at bedtime Day 6: 1 tab at breakfast   You also need to be COVID tested given your current symptoms. You can go to one of the  testing sites listed below, while they are opened (see hours). You do not need an order and will stay in your car during the test. You do need to self isolate until your results return and if positive 14 days from when your symptoms started and until you are 3 days symptom free.   Testing Locations (Monday - Friday, 8 a.m. - 3:30 p.m.) . [place]: [facility], [address], [place], [place]: [place], [place], [place], [place] (entrance off [address])  . [facility]: (Closed each Monday): Testing site relocated to the short stay covered  drive at [facility]. (Use the [employer] entrance to [facility] next to [facility].) Approximately 5 minutes was spent documenting and reviewing patient's chart.    From your responses in the eVisit questionnaire you describe inflammation in the upper respiratory tract which is causing a significant cough.  This is commonly called Bronchitis and has four common causes:    Allergies  Viral Infections  Acid Reflux  Bacterial Infection Allergies, viruses and acid reflux are treated by controlling symptoms or eliminating the cause. An example might be a cough caused by taking certain blood pressure medications. You stop the cough by changing the medication. Another example might be a cough caused by acid reflux. Controlling the reflux helps control the cough.  USE OF BRONCHODILATOR ("RESCUE") INHALERS: There is a risk from using your bronchodilator too frequently.  The risk is that over-reliance on a medication which only relaxes the muscles surrounding the breathing tubes can reduce the effectiveness of medications prescribed to reduce swelling and congestion of the tubes themselves.  Although you feel brief relief from the bronchodilator inhaler, your asthma may actually be worsening with the tubes becoming more swollen and filled with mucus.  This can delay other crucial treatments, such as oral steroid medications. If you need to use a bronchodilator inhaler daily, several  times per day, you should discuss this with your provider.  There are probably better treatments that could be used to keep your asthma under control.     HOME CARE . Only take medications as instructed by your medical team. . Complete the entire course of an antibiotic. . Drink plenty of fluids and get plenty of rest. . Avoid close contacts especially the very young and the elderly . Cover your mouth if you cough or cough into your sleeve. . Always remember to wash your hands . A steam or  ultrasonic humidifier can help congestion.   GET HELP RIGHT AWAY IF: . You develop worsening fever. . You become short of breath . You cough up blood. . Your symptoms persist after you have completed your treatment plan MAKE SURE YOU   Understand these instructions.  Will watch your condition.  Will get help right away if you are not doing well or get worse.  Your e-visit answers were reviewed by a board certified advanced clinical practitioner to complete your personal care plan.  Depending on the condition, your plan could have included both over the counter or prescription medications. If there is a problem please reply  once you have received a response from your provider. Your safety is important to [place].  If you have drug allergies check your prescription carefully.    You can use MyChart to ask questions about today's visit, request a non-urgent call back, or ask for a work or school excuse for 24 hours related to this e-Visit. If it has been greater than 24 hours you will need to follow up with your provider, or enter a new e-Visit to address those concerns. You will get an e-mail in the next two days asking about your experience.  I hope that your e-visit has been valuable and will speed your recovery. Thank you for using e-visits.

## 2019-06-14 ENCOUNTER — Ambulatory Visit: Payer: BLUE CROSS/BLUE SHIELD

## 2019-06-16 ENCOUNTER — Ambulatory Visit (INDEPENDENT_AMBULATORY_CARE_PROVIDER_SITE_OTHER): Payer: BC Managed Care – PPO

## 2019-06-16 ENCOUNTER — Telehealth: Payer: Self-pay

## 2019-06-16 ENCOUNTER — Other Ambulatory Visit: Payer: Self-pay

## 2019-06-16 DIAGNOSIS — N529 Male erectile dysfunction, unspecified: Secondary | ICD-10-CM

## 2019-06-16 DIAGNOSIS — Z23 Encounter for immunization: Secondary | ICD-10-CM | POA: Diagnosis not present

## 2019-06-16 MED ORDER — SILDENAFIL CITRATE 100 MG PO TABS: ORAL_TABLET | ORAL | 8 refills | Status: DC

## 2019-06-16 NOTE — Telephone Encounter (Signed)
Rx refilled and sent to pharmacy.

## 2019-06-16 NOTE — Telephone Encounter (Signed)
Please advise 

## 2019-06-16 NOTE — Telephone Encounter (Signed)
Patient is requesting a refill on his Sildenafil 100mg .  Last filled 05/24/18 Last OV 10/28/17

## 2019-06-16 NOTE — Telephone Encounter (Signed)
Refill OK

## 2019-09-14 ENCOUNTER — Other Ambulatory Visit: Payer: Self-pay

## 2019-09-14 ENCOUNTER — Encounter: Payer: Self-pay | Admitting: Family Medicine

## 2019-09-14 ENCOUNTER — Ambulatory Visit (INDEPENDENT_AMBULATORY_CARE_PROVIDER_SITE_OTHER): Payer: BC Managed Care – PPO | Admitting: Family Medicine

## 2019-09-14 VITALS — BP 122/74 | HR 42 | Temp 97.6°F | Wt 195.2 lb

## 2019-09-14 DIAGNOSIS — R634 Abnormal weight loss: Secondary | ICD-10-CM | POA: Diagnosis not present

## 2019-09-14 DIAGNOSIS — R7309 Other abnormal glucose: Secondary | ICD-10-CM

## 2019-09-14 DIAGNOSIS — IMO0001 Reserved for inherently not codable concepts without codable children: Secondary | ICD-10-CM

## 2019-09-14 DIAGNOSIS — E1165 Type 2 diabetes mellitus with hyperglycemia: Secondary | ICD-10-CM

## 2019-09-14 DIAGNOSIS — R638 Other symptoms and signs concerning food and fluid intake: Secondary | ICD-10-CM

## 2019-09-14 DIAGNOSIS — E119 Type 2 diabetes mellitus without complications: Secondary | ICD-10-CM

## 2019-09-14 LAB — BASIC METABOLIC PANEL
BUN: 13 mg/dL (ref 6–23)
CO2: 25 mEq/L (ref 19–32)
Calcium: 9.3 mg/dL (ref 8.4–10.5)
Chloride: 98 mEq/L (ref 96–112)
Creatinine, Ser: 0.83 mg/dL (ref 0.40–1.50)
GFR: 93.2 mL/min (ref >60.00)
Glucose, Bld: 339 mg/dL — ABNORMAL HIGH (ref 70–99)
Potassium: 4.3 mEq/L (ref 3.5–5.1)
Sodium: 134 mEq/L — ABNORMAL LOW (ref 135–145)

## 2019-09-14 LAB — POCT GLYCOSYLATED HEMOGLOBIN (HGB A1C): Hemoglobin A1C: 12.2 % — AB (ref 4.0–5.6)

## 2019-09-14 LAB — HEPATIC FUNCTION PANEL
ALT: 14 U/L (ref 0–53)
AST: 12 U/L (ref 0–37)
Albumin: 4.2 g/dL (ref 3.5–5.2)
Alkaline Phosphatase: 71 U/L (ref 39–117)
Bilirubin, Direct: 0.2 mg/dL (ref 0.0–0.3)
Total Bilirubin: 0.9 mg/dL (ref 0.2–1.2)
Total Protein: 7.1 g/dL (ref 6.0–8.3)

## 2019-09-14 LAB — GLUCOSE, POCT (MANUAL RESULT ENTRY): POC Glucose: 391 mg/dl — AB (ref 70–99)

## 2019-09-14 MED ORDER — METFORMIN HCL 500 MG PO TABS: 500.0000 mg | ORAL_TABLET | Freq: Two times a day (BID) | ORAL | 3 refills | Status: DC

## 2019-09-14 MED ORDER — BD PEN NEEDLE NANO U/F 32G X 4 MM MISC: 1 refills | Status: DC

## 2019-09-14 NOTE — Progress Notes (Signed)
Subjective:     Patient ID: Terry Johns, male   DOB: [birth year], 64 y.o.   MRN: [id number]  HPI   Terry Johns is seen with recent weight loss, increased thirst, increased urine frequency.  He has history of hyperglycemia and has tried to manage his diabetes without medication but during the past several months has had increasing blood sugars.  Has been monitoring this daily.  He had sugar this morning of 304.  He has not had any recent prednisone use.  No recent fevers or chills.  He has lost some weight.  He has also noticed some muscle wasting.  Strong family history of type 2 diabetes in his father.  He has been drinking lots of water.  He has also noticed some vision changes with blurred vision.  Past Medical History:  Diagnosis Date  . Cataract   . DYSPHAGIA UNSPECIFIED 05/28/2010  . Ehlers-Danlos disease   . 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 EXTERNAL HEMORRHOIDS 05/17/2009  . MITRAL VALVE PROLAPSE 05/17/2009   Past Surgical History:  Procedure Laterality Date  . COLONOSCOPY    . FEMORAL ARTERY EXPLORATION Right 12/09/2016   Procedure: Evacuation of Hematoma Right Groin, Repair of Right Superfiical Femoral Artery;  Surgeon: Angelia Mould, MD;  Location: [facility];  Service: Vascular;  Laterality: Right;  . PVC ABLATION  12/08/2016  . PVC ABLATION N/A 12/08/2016   Procedure: PVC Ablation;  Surgeon: Will [name], MD;  Location: [facility];  Service: Cardiovascular;  Laterality: N/A;  . SHOULDER ARTHROSCOPY WITH ROTATOR CUFF REPAIR Left 2017   "Terry Johns surgery"    reports that he has quit smoking. His smoking use included cigarettes. He has a 0.30 pack-year smoking history. He has never used smokeless tobacco. He reports that he does not drink alcohol or use drugs. family history includes Breast cancer in his mother; Cancer in his father; Diabetes in his father; Hypertension in his father. No Known Allergies  Wt Readings from Last 3 Encounters:  09/14/19 195  lb 3.2 oz (88.5 kg)  04/01/18 221 lb (100.2 kg)  10/28/17 212 lb 8 oz (96.4 kg)     Review of Systems  Constitutional: Positive for unexpected weight change. Negative for appetite change, chills, fatigue and fever.  Eyes: Positive for visual disturbance.  Respiratory: Negative for cough, chest tightness and shortness of breath.   Cardiovascular: Negative for chest pain, palpitations and leg swelling.  Gastrointestinal: Negative for abdominal pain.  Endocrine: Positive for polydipsia and polyuria.  Genitourinary: Negative for difficulty urinating and dysuria.  Neurological: Negative for dizziness, syncope, weakness, light-headedness and headaches.       Objective:   Physical Exam Vitals reviewed.  Constitutional:      General: He is not in acute distress.    Appearance: Normal appearance. He is not ill-appearing.  Cardiovascular:     Rate and Rhythm: Normal rate and regular rhythm.  Pulmonary:     Effort: Pulmonary effort is normal.     Breath sounds: Normal breath sounds.  Musculoskeletal:     Right lower leg: No edema.     Left lower leg: No edema.  Neurological:     General: No focal deficit present.     Mental Status: He is alert.        Assessment:     Terry Johns has poorly controlled type 2 diabetes with classic findings by history including weight loss, polyuria, polydipsia, and blurred vision.  Nonfasting blood sugar this morning 391 with A1c 12.2%.  This compares with A1c of 6.6% last year    Plan:     -Continue to stay well-hydrated (non-glucose beverages and water) -Continue to monitor blood sugars daily -Start Tresiba 10 units once daily with sample given instructions given for use.  Hopefully this can be short-term -Start Metformin 500 mg twice daily.  Reviewed potential side effects -Check basic metabolic panel and hepatic panel -Bring back for 3-week follow-up.  He will need other things including lipids.  We have recommended trying to get his blood sugar  under better control before reassessing lipids. -Offered diabetic education but he would like to try the above first.  He plans to start exercising and being more diligent with diet as well.  Terry Post MD [facility]

## 2019-09-14 NOTE — Patient Instructions (Signed)
Start the [place] 10 units once daily  Start the Metformin 500 mg twice daily  Keep monitoring blood sugars and bring back at follow up to review   Diabetes Mellitus and Nutrition, Adult When you have diabetes (diabetes mellitus), it is very important to have healthy eating habits because your blood sugar (glucose) levels are greatly affected by what you eat and drink. Eating healthy foods in the appropriate amounts, at about the same times every day, can help you:  Control your blood glucose.  Lower your risk of heart disease.  Improve your blood pressure.  Reach or maintain a healthy weight. Every person with diabetes is different, and each person has different needs for a meal plan. Your health care provider may recommend that you work with a diet and nutrition specialist (dietitian) to make a meal plan that is best for you. Your meal plan may vary depending on factors such as:  The calories you need.  The medicines you take.  Your weight.  Your blood glucose, blood pressure, and cholesterol levels.  Your activity level.  Other health conditions you have, such as heart or kidney disease. How do carbohydrates affect me? Carbohydrates, also called carbs, affect your blood glucose level more than any other type of food. Eating carbs naturally raises the amount of glucose in your blood. Carb counting is a method for keeping track of how many carbs you eat. Counting carbs is important to keep your blood glucose at a healthy level, especially if you use insulin or take certain oral diabetes medicines. It is important to know how many carbs you can safely have in each meal. This is different for every person. Your dietitian can help you calculate how many carbs you should have at each meal and for each snack. Foods that contain carbs include:  Bread, cereal, rice, pasta, and crackers.  Potatoes and corn.  Peas, beans, and lentils.  Milk and yogurt.  Fruit and  juice.  Desserts, such as cakes, cookies, ice cream, and candy. How does alcohol affect me? Alcohol can cause a sudden decrease in blood glucose (hypoglycemia), especially if you use insulin or take certain oral diabetes medicines. Hypoglycemia can be a life-threatening condition. Symptoms of hypoglycemia (sleepiness, dizziness, and confusion) are similar to symptoms of having too much alcohol. If your health care provider says that alcohol is safe for you, follow these guidelines:  Limit alcohol intake to no more than 1 drink per day for nonpregnant women and 2 drinks per day for men. One drink equals 12 oz of beer, 5 oz of wine, or 1 oz of hard liquor.  Do not drink on an empty stomach.  Keep yourself hydrated with water, diet soda, or unsweetened iced tea.  Keep in mind that regular soda, juice, and other mixers may contain a lot of sugar and must be counted as carbs. What are tips for following this plan?  Reading food labels  Start by checking the serving size on the "Nutrition Facts" label of packaged foods and drinks. The amount of calories, carbs, fats, and other nutrients listed on the label is based on one serving of the item. Many items contain more than one serving per package.  Check the total grams (g) of carbs in one serving. You can calculate the number of servings of carbs in one serving by dividing the total carbs by 15. For example, if a food has 30 g of total carbs, it would be equal to 2 servings of carbs.  Check the number of grams (g) of saturated and trans fats in one serving. Choose foods that have low or no amount of these fats.  Check the number of milligrams (mg) of salt (sodium) in one serving. Most people should limit total sodium intake to less than 2,300 mg per day.  Always check the nutrition information of foods labeled as "low-fat" or "nonfat". These foods may be higher in added sugar or refined carbs and should be avoided.  Talk to your dietitian to  identify your daily goals for nutrients listed on the label. Shopping  Avoid buying canned, premade, or processed foods. These foods tend to be high in fat, sodium, and added sugar.  Shop around the outside edge of the grocery store. This includes fresh fruits and vegetables, bulk grains, fresh meats, and fresh dairy. Cooking  Use low-heat cooking methods, such as baking, instead of high-heat cooking methods like deep frying.  Cook using healthy oils, such as olive, canola, or sunflower oil.  Avoid cooking with butter, cream, or high-fat meats. Meal planning  Eat meals and snacks regularly, preferably at the same times every day. Avoid going long periods of time without eating.  Eat foods high in fiber, such as fresh fruits, vegetables, beans, and whole grains. Talk to your dietitian about how many servings of carbs you can eat at each meal.  Eat 4-6 ounces (oz) of lean protein each day, such as lean meat, chicken, fish, eggs, or tofu. One oz of lean protein is equal to: ? 1 oz of meat, chicken, or fish. ? 1 egg. ?  cup of tofu.  Eat some foods each day that contain healthy fats, such as avocado, nuts, seeds, and fish. Lifestyle  Check your blood glucose regularly.  Exercise regularly as told by your health care provider. This may include: ? 150 minutes of moderate-intensity or vigorous-intensity exercise each week. This could be brisk walking, biking, or water aerobics. ? Stretching and doing strength exercises, such as yoga or weightlifting, at least 2 times a week.  Take medicines as told by your health care provider.  Do not use any products that contain nicotine or tobacco, such as cigarettes and e-cigarettes. If you need help quitting, ask your health care provider.  Work with a Social worker or diabetes educator to identify strategies to manage stress and any emotional and social challenges. Questions to ask a health care provider  Do I need to meet with a diabetes  educator?  Do I need to meet with a dietitian?  What number can I call if I have questions?  When are the best times to check my blood glucose? Where to find more information:  American Diabetes Association: diabetes.org  Academy of Nutrition and Dietetics: www.eatright.[employer] of Diabetes and Digestive and Kidney Diseases (NIH): [employer] Summary  A healthy meal plan will help you control your blood glucose and maintain a healthy lifestyle.  Working with a diet and nutrition specialist (dietitian) can help you make a meal plan that is best for you.  Keep in mind that carbohydrates (carbs) and alcohol have immediate effects on your blood glucose levels. It is important to count carbs and to use alcohol carefully. This information is not intended to replace advice given to you by your health care provider. Make sure you discuss any questions you have with your health care provider. Document Revised: 07/31/2017 Document Reviewed: 09/22/2016 Elsevier Patient Education  2020 [employer].

## 2019-09-15 ENCOUNTER — Encounter: Payer: Self-pay | Admitting: Family Medicine

## 2019-10-05 ENCOUNTER — Ambulatory Visit (INDEPENDENT_AMBULATORY_CARE_PROVIDER_SITE_OTHER): Payer: BC Managed Care – PPO | Admitting: Family Medicine

## 2019-10-05 ENCOUNTER — Encounter: Payer: Self-pay | Admitting: Family Medicine

## 2019-10-05 ENCOUNTER — Other Ambulatory Visit: Payer: Self-pay

## 2019-10-05 VITALS — BP 110/78 | HR 77 | Temp 97.2°F | Wt 194.5 lb

## 2019-10-05 DIAGNOSIS — N529 Male erectile dysfunction, unspecified: Secondary | ICD-10-CM

## 2019-10-05 DIAGNOSIS — E1165 Type 2 diabetes mellitus with hyperglycemia: Secondary | ICD-10-CM | POA: Diagnosis not present

## 2019-10-05 DIAGNOSIS — H538 Other visual disturbances: Secondary | ICD-10-CM | POA: Diagnosis not present

## 2019-10-05 DIAGNOSIS — H00012 Hordeolum externum right lower eyelid: Secondary | ICD-10-CM | POA: Diagnosis not present

## 2019-10-05 MED ORDER — JARDIANCE 10 MG PO TABS: 10.0000 mg | ORAL_TABLET | Freq: Every day | ORAL | 3 refills | Status: DC

## 2019-10-05 MED ORDER — METFORMIN HCL 500 MG PO TABS
ORAL_TABLET | ORAL | 3 refills | Status: DC
Start: 2019-10-05 — End: 2020-02-20

## 2019-10-05 MED ORDER — SILDENAFIL CITRATE 100 MG PO TABS: ORAL_TABLET | ORAL | 8 refills | Status: DC

## 2019-10-05 NOTE — Patient Instructions (Signed)
Follow up in 3 months  Stye  A stye, also known as a hordeolum, is a bump that forms on an eyelid. It may look like a pimple next to the eyelash. A stye can form inside the eyelid (internal stye) or outside the eyelid (external stye). A stye can cause redness, swelling, and pain on the eyelid. Styes are very common. Anyone can get them at any age. They usually occur in just one eye, but you may have more than one in either eye. What are the causes? A stye is caused by an infection. The infection is almost always caused by bacteria called Staphylococcus aureus. This is a common type of bacteria that lives on the skin. An internal stye may result from an infected oil-producing gland inside the eyelid. An external stye may be caused by an infection at the base of the eyelash (hair follicle). What increases the risk? You are more likely to develop a stye if:  You have had a stye before.  You have any of these conditions: ? Diabetes. ? Red, itchy, inflamed eyelids (blepharitis). ? A skin condition such as seborrheic dermatitis or rosacea. ? High fat levels in your blood (lipids). What are the signs or symptoms? The most common symptom of a stye is eyelid pain. Internal styes are more painful than external styes. Other symptoms may include:  Painful swelling of your eyelid.  A scratchy feeling in your eye.  Tearing and redness of your eye.  Pus draining from the stye. How is this diagnosed? Your health care provider may be able to diagnose a stye just by examining your eye. The health care provider may also check to make sure:  You do not have a fever or other signs of a more serious infection.  The infection has not spread to other parts of your eye or areas around your eye. How is this treated? Most styes will clear up in a few days without treatment or with warm compresses applied to the area. You may need to use antibiotic drops or ointment to treat an infection. In some cases, if  your stye does not heal with routine treatment, your health care provider may drain pus from the stye using a thin blade or needle. This may be done if the stye is large, causing a lot of pain, or affecting your vision. Follow these instructions at home:  Take over-the-counter and prescription medicines only as told by your health care provider. This includes eye drops or ointments.  If you were prescribed an antibiotic medicine, apply or use it as told by your health care provider. Do not stop using the antibiotic even if your condition improves.  Apply a warm, wet cloth (warm compress) to your eye for 5-10 minutes, 4 times a day.  Clean the affected eyelid as directed by your health care provider.  Do not wear contact lenses or eye makeup until your stye has healed.  Do not try to pop or drain the stye.  Do not rub your eye. Contact a health care provider if:  You have chills or a fever.  Your stye does not go away after several days.  Your stye affects your vision.  Your eyeball becomes swollen, red, or painful. Get help right away if:  You have pain when moving your eye around. Summary  A stye is a bump that forms on an eyelid. It may look like a pimple next to the eyelash.  A stye can form inside the eyelid (internal  stye) or outside the eyelid (external stye). A stye can cause redness, swelling, and pain on the eyelid.  Your health care provider may be able to diagnose a stye just by examining your eye.  Apply a warm, wet cloth (warm compress) to your eye for 5-10 minutes, 4 times a day. This information is not intended to replace advice given to you by your health care provider. Make sure you discuss any questions you have with your health care provider. Document Revised: 07/31/2017 Document Reviewed: 04/30/2017 Elsevier Patient Education  [place].   Empagliflozin oral tablets What is this medicine? EMPAGLIFLOZIN (EM pa gli FLOE zin) helps to treat type 2  diabetes. It helps to control blood sugar. This drug may also reduce the risk of heart attack or stroke if you have type 2 diabetes and risk factors for heart disease. Treatment is combined with diet and exercise. This medicine may be used for other purposes; ask your health care provider or pharmacist if you have questions. COMMON BRAND NAME(S): Jardiance What should I tell my health care provider before I take this medicine? They need to know if you have any of these conditions:  dehydration  diabetic ketoacidosis  diet low in salt  eating less due to illness, surgery, dieting, or any other reason  having surgery  high cholesterol  high levels of potassium in the blood  history of pancreatitis or pancreas problems  history of yeast infection of the penis or vagina  if you often drink alcohol  infections in the bladder, kidneys, or urinary tract  kidney disease  liver disease  low blood pressure  on hemodialysis  problems urinating  type 1 diabetes  uncircumcised male  an unusual or allergic reaction to empagliflozin, other medicines, foods, dyes, or preservatives  pregnant or trying to get pregnant  breast-feeding How should I use this medicine? Take this medicine by mouth with a glass of water. Follow the directions on the prescription label. Take it in the morning, with or without food. Take your dose at the same time each day. Do not take more often than directed. Do not stop taking except on your doctor's advice. Talk to your pediatrician regarding the use of this medicine in children. Special care may be needed. Overdosage: If you think you have taken too much of this medicine contact a poison control center or emergency room at once. NOTE: This medicine is only for you. Do not share this medicine with others. What if I miss a dose? If you miss a dose, take it as soon as you can. If it is almost time for your next dose, take only that dose. Do not take  double or extra doses. What may interact with this medicine? Do not take this medicine with any of the following medications:  gatifloxacin This medicine may also interact with the following medications:  alcohol  certain medicines for blood pressure, heart disease  diuretics This list may not describe all possible interactions. Give your health care provider a list of all the medicines, herbs, non-prescription drugs, or dietary supplements you use. Also tell them if you smoke, drink alcohol, or use illegal drugs. Some items may interact with your medicine. What should I watch for while using this medicine? Visit your doctor or health care professional for regular checks on your progress. This medicine can cause a serious condition in which there is too much acid in the blood. If you develop nausea, vomiting, stomach pain, unusual tiredness, or breathing  problems, stop taking this medicine and call your doctor right away. If possible, use a ketone dipstick to check for ketones in your urine. A test called the HbA1C (A1C) will be monitored. This is a simple blood test. It measures your blood sugar control over the last 2 to 3 months. You will receive this test every 3 to 6 months. Learn how to check your blood sugar. Learn the symptoms of low and high blood sugar and how to manage them. Always carry a quick-source of sugar with you in case you have symptoms of low blood sugar. Examples include hard sugar candy or glucose tablets. Make sure others know that you can choke if you eat or drink when you develop serious symptoms of low blood sugar, such as seizures or unconsciousness. They must get medical help at once. Tell your doctor or health care professional if you have high blood sugar. You might need to change the dose of your medicine. If you are sick or exercising more than usual, you might need to change the dose of your medicine. Do not skip meals. Ask your doctor or health care professional  if you should avoid alcohol. Many nonprescription cough and cold products contain sugar or alcohol. These can affect blood sugar. Wear a medical ID bracelet or chain, and carry a card that describes your disease and details of your medicine and dosage times. What side effects may I notice from receiving this medicine? Side effects that you should report to your doctor or health care professional as soon as possible:  allergic reactions like skin rash, itching or hives, swelling of the face, lips, or tongue  breathing problems  dizziness  feeling faint or lightheaded, falls  muscle weakness  nausea, vomiting, unusual stomach upset or pain  penile discharge, itching, or pain in men  signs and symptoms of a genital infection, such as fever; tenderness, redness, or swelling in the genitals or area from the genitals to the back of the rectum  signs and symptoms of low blood sugar such as feeling anxious, confusion, dizziness, increased hunger, unusually weak or tired, sweating, shakiness, cold, irritable, headache, blurred vision, fast heartbeat, loss of consciousness  signs and symptoms of a urinary tract infection, such as fever, chills, a burning feeling when urinating, blood in the urine, back pain  trouble passing urine or change in the amount of urine, including an urgent need to urinate more often, in larger amounts, or at night  unusual tiredness  vaginal discharge, itching, or odor in women Side effects that usually do not require medical attention (report to your doctor or health care professional if they continue or are bothersome):  mild increase in urination  thirsty This list may not describe all possible side effects. Call your doctor for medical advice about side effects. You may report side effects to FDA at 1-800-FDA-1088. Where should I keep my medicine? Keep out of the reach of children. Store at room temperature between 20 and 25 degrees C (68 and 77 degrees F).  Throw away any unused medicine after the expiration date. NOTE: This sheet is a summary. It may not cover all possible information. If you have questions about this medicine, talk to your doctor, pharmacist, or health care provider.  2020 Elsevier/Gold Standard (2017-04-30 10:25:34)

## 2019-10-05 NOTE — Progress Notes (Signed)
Established Patient Office Visit  Subjective:  Patient ID: Terry Johns, male    DOB: November 17, 1955  Age: 64 y.o. MRN: [id number]  CC:  Chief Complaint  Patient presents with  . Diabetes    HPI Terry Johns presents for return visit for management of Type 2 DM Patient started insulin degludec and metformin on 1/13 Brings in chart of blood sugar readings from various times during the day, some fasting, some post prandial BS remains elevated >130 fasting on all but one day Concerned about persistent blurred vision since starting insulin, thinks it is related  In the past vision was blurred only with very high BS Also here for refill of sildenafil; no questions or concerns about medication or ED  Past Medical History:  Diagnosis Date  . Cataract   . DYSPHAGIA UNSPECIFIED 05/28/2010  . Ehlers-Danlos disease   . 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 EXTERNAL HEMORRHOIDS 05/17/2009  . MITRAL VALVE PROLAPSE 05/17/2009    Past Surgical History:  Procedure Laterality Date  . COLONOSCOPY    . FEMORAL ARTERY EXPLORATION Right 12/09/2016   Procedure: Evacuation of Hematoma Right Groin, Repair of Right Superfiical Femoral Artery;  Surgeon: Angelia Mould, MD;  Location: [facility];  Service: Vascular;  Laterality: Right;  . PVC ABLATION  12/08/2016  . PVC ABLATION N/A 12/08/2016   Procedure: PVC Ablation;  Surgeon: Will [name], MD;  Location: [facility];  Service: Cardiovascular;  Laterality: N/A;  . SHOULDER ARTHROSCOPY WITH ROTATOR CUFF REPAIR Left 2017   "[name]"    Family History  Problem Relation Age of Onset  . Diabetes Father   . Hypertension Father   . Cancer Father        lung, basal cell, melanoma  . Breast cancer Mother   . Colon cancer Neg Hx   . Esophageal cancer Neg Hx   . Rectal cancer Neg Hx   . Stomach cancer Neg Hx     Social History   Socioeconomic History  . Marital status: Married    Spouse name: Not on file  .  Number of children: Not on file  . Years of education: Not on file  . Highest education level: Not on file  Occupational History  . Not on file  Tobacco Use  . Smoking status: Former Smoker    Packs/day: 0.10    Years: 3.00    Pack years: 0.30    Types: Cigarettes  . Smokeless tobacco: Never Used  Substance and Sexual Activity  . Alcohol use: No  . Drug use: No  . Sexual activity: Yes  Other Topics Concern  . Not on file  Social History Narrative  . Not on file   Social Determinants of Health   Financial Resource Strain:   . Difficulty of Paying Living Expenses: Not on file  Food Insecurity:   . Worried About Charity fundraiser in the Last Year: Not on file  . Ran Out of Food in the Last Year: Not on file  Transportation Needs:   . Lack of Transportation (Medical): Not on file  . Lack of Transportation (Non-Medical): Not on file  Physical Activity:   . Days of Exercise per Week: Not on file  . Minutes of Exercise per Session: Not on file  Stress:   . Feeling of Stress : Not on file  Social Connections:   . Frequency of Communication with Friends and Family: Not on file  . Frequency of Social  Gatherings with Friends and Family: Not on file  . Attends Religious Services: Not on file  . Active Member of Clubs or Organizations: Not on file  . Attends Archivist Meetings: Not on file  . Marital Status: Not on file  Intimate Partner Violence:   . Fear of Current or Ex-Partner: Not on file  . Emotionally Abused: Not on file  . Physically Abused: Not on file  . Sexually Abused: Not on file    Outpatient Medications Prior to Visit  Medication Sig Dispense Refill  . acetaminophen (TYLENOL) 325 MG tablet Take 650 mg by mouth every 6 (six) hours as needed for moderate pain.    . Ascorbic Acid (VITAMIN C ER PO) Take 1 tablet by mouth daily.    Marland Kitchen glucose blood (ACCU-CHEK GUIDE) test strip Use as instructed to check blood sugars twice weekly. 50 each 0  . Insulin  Pen Needle (BD PEN NEEDLE NANO U/F) 32G X 4 MM MISC Use one needle to inject long lasting insulin once daily. 100 each 1  . VITAMIN D PO Take 1 tablet by mouth daily.    . Insulin Degludec (TRESIBA) 100 UNIT/ML SOLN Inject 10 Units into the skin daily.    . metFORMIN (GLUCOPHAGE) 500 MG tablet Take 1 tablet (500 mg total) by mouth 2 (two) times daily with a meal. 180 tablet 3  . predniSONE (STERAPRED UNI-PAK 21 TAB) 10 MG (21) TBPK tablet Use as directed 21 tablet 0  . sildenafil (VIAGRA) 100 MG tablet TAKE 1/2 TO 1 TABLET BY MOUTH EVERY DAY AS NEEDED 4 tablet 8   No facility-administered medications prior to visit.    No Known Allergies  ROS Review of Systems  GEN: Well nourished, well developed, in no acute distress, Alert and interactive HEENT: normocephalic, atraumatic. PERRL, EOMI, good conjugate gaze. Injected right conjunctiva without drainage. Small red nodule mid aspect lower lid visible, consistent with a stye. Nares symmetric, patent. Moist mucous membranes Neck: Supple, no JVD, carotid bruits, or masses. Normal ROM Cardiac: no concerns; no chest discomfort or palpitations, no edema  Respiratory:  normal work of breathing GI: soft, nontender, nondistended MS: Normal tone and ROM, strength and sensation intact, no deformity or atrophy Skin: Intact, warm and dry, no rashes, no lesions, no erythema Neuro:  Alert and Oriented x 3, Cranial nerves II to XII intact, Reflex symmetric, Sensation intact, follows commands, gait normal Psych: euthymic mood, full affect    Objective:    Physical Exam  BP 110/78 (BP Location: Left Arm, Patient Position: Sitting, Cuff Size: Large)   Pulse 77   Temp (!) 97.2 F (36.2 C) (Temporal)   Wt 194 lb 8 oz (88.2 kg)   SpO2 94%   BMI 25.31 kg/m  Wt Readings from Last 3 Encounters:  10/05/19 194 lb 8 oz (88.2 kg)  09/14/19 195 lb 3.2 oz (88.5 kg)  04/01/18 221 lb (100.2 kg)     Health Maintenance Due  Topic Date Due  . FOOT EXAM   06/07/1965  . OPHTHALMOLOGY EXAM  06/07/1965  . URINE MICROALBUMIN  06/07/1965  . COLONOSCOPY  09/24/2016    There are no preventive care reminders to display for this patient.  Lab Results  Component Value Date   TSH 1.81 06/08/2017   Lab Results  Component Value Date   WBC 4.8 06/08/2017   HGB 14.9 06/08/2017   HCT 43.4 06/08/2017   MCV 84.7 06/08/2017   PLT 216.0 06/08/2017   Lab Results  Component Value Date   NA 134 (L) 09/14/2019   K 4.3 09/14/2019   CO2 25 09/14/2019   GLUCOSE 339 (H) 09/14/2019   BUN 13 09/14/2019   CREATININE 0.83 09/14/2019   BILITOT 0.9 09/14/2019   ALKPHOS 71 09/14/2019   AST 12 09/14/2019   ALT 14 09/14/2019   PROT 7.1 09/14/2019   ALBUMIN 4.2 09/14/2019   CALCIUM 9.3 09/14/2019   ANIONGAP 6 12/10/2016   GFR 93.20 09/14/2019   Lab Results  Component Value Date   CHOL 198 06/08/2017   Lab Results  Component Value Date   HDL 62.50 06/08/2017   Lab Results  Component Value Date   LDLCALC 114 (H) 06/08/2017   Lab Results  Component Value Date   TRIG 105.0 06/08/2017   Lab Results  Component Value Date   CHOLHDL 3 06/08/2017   Lab Results  Component Value Date   HGBA1C 12.2 (A) 09/14/2019      Assessment & Plan:   Problem List Items Addressed This Visit      Other   ERECTILE DYSFUNCTION, ORGANIC   Relevant Medications   sildenafil (VIAGRA) 100 MG tablet      Meds ordered this encounter  Medications  . sildenafil (VIAGRA) 100 MG tablet    Sig: TAKE 1/2 TO 1 TABLET BY MOUTH EVERY DAY AS NEEDED    Dispense:  4 tablet    Refill:  8  . empagliflozin (JARDIANCE) 10 MG TABS tablet    Sig: Take 10 mg by mouth daily before breakfast.    Dispense:  90 tablet    Refill:  3  . metFORMIN (GLUCOPHAGE) 500 MG tablet    Sig: Take two tablets twice daily with meals.    Dispense:  180 tablet    Refill:  3   1. Recent exacerbation of elevated blood glucose Recently started on insulin and metformin Tolerating both  medications well, except for concerns of blurred vision that he associates with starting insulin Glucose levels checked on home remain elevated >130 for fasting Increase metformin to 1000 mg twice daily Agreeable to try Jardiance, Rx sent to pharmacy and will work on getting cost effective Discussed healthy diet and offered future visit with diabetes educator if interested  2. Erectile dysfunction Refilled Viagra No additional concerns  3. Blurred vision  Feels it is worse since starting insulin Advised to make an appointment with optometrist  4. Small stye right lower lid without discharge Advised warm compresses and monitor for resolution   Follow-up: Return in about 3 months (around 01/02/2020).    Emmaline Life, RN   Above notes reviewed and agree.  He will d/c the [place] and start Jardiance (if we can get covered) and increase the Metformin  He has some bilateral vision change that correlates with his increase in sugars.  We do feel he  Needs to get repeat eye exam and he is setting up.  He has benign appearing stye as above.  Should resolve with warm compresses  Refilled Viagra for ED.  [name] MD [facility] at [facility]

## 2019-10-07 LAB — HM DIABETES EYE EXAM

## 2019-10-14 ENCOUNTER — Telehealth: Payer: Self-pay | Admitting: Family Medicine

## 2019-10-14 ENCOUNTER — Other Ambulatory Visit: Payer: Self-pay

## 2019-10-14 MED ORDER — CANAGLIFLOZIN 100 MG PO TABS: 100.0000 mg | ORAL_TABLET | Freq: Every day | ORAL | 1 refills | Status: DC

## 2019-10-14 NOTE — Telephone Encounter (Signed)
Want me to have him call insurance or do you have another medication that you prefer?

## 2019-10-14 NOTE — Telephone Encounter (Signed)
Lets change to Invokana 100 mg po qd

## 2019-10-14 NOTE — Telephone Encounter (Signed)
Pt had a refill on Jardiance 10 mg tabs and per pt, medication is $500. Pt would like to know if medication can be substitute? Per pt, pharmacy did not tell him what medication his insurance will cover. Thanks   [facility] in [place] [address] [place]

## 2019-10-14 NOTE — Telephone Encounter (Signed)
Called patient and let him know that I have sent this new Rx to his pharmacy. Advised to patient that if this is not covered to please check with insurance to see equivalent that will be covered and let us know. Patient verbalized an understanding.

## 2019-10-15 ENCOUNTER — Encounter: Payer: Self-pay | Admitting: Family Medicine

## 2019-10-18 ENCOUNTER — Other Ambulatory Visit: Payer: Self-pay

## 2019-10-18 MED ORDER — DAPAGLIFLOZIN PROPANEDIOL 10 MG PO TABS: 10.0000 mg | ORAL_TABLET | Freq: Every day | ORAL | 0 refills | Status: DC

## 2019-11-03 ENCOUNTER — Other Ambulatory Visit: Payer: Self-pay | Admitting: *Deleted

## 2019-11-03 ENCOUNTER — Telehealth: Payer: Self-pay | Admitting: Family Medicine

## 2019-11-03 MED ORDER — ACCU-CHEK GUIDE VI STRP: ORAL_STRIP | 3 refills | Status: DC

## 2019-11-03 NOTE — Telephone Encounter (Addendum)
Pt has new insurance. He is needing a PA for Jardiance. Pt has not be able to take the medication due to insurance change.   [facility] [address] FAX: [phone]   Pt would like to be contacted when finished [phone]    Insurance on pt's chart has been updated to [employer] and verified plan with company by phone.

## 2019-11-09 MED ORDER — CANAGLIFLOZIN 300 MG PO TABS: 300.0000 mg | ORAL_TABLET | Freq: Every day | ORAL | 3 refills | Status: DC

## 2019-11-09 NOTE — Telephone Encounter (Signed)
PA for [name] has been denied. Per Dr Elease Hashimoto Rx for Invokana 300 mg sent. Patient is aware. Medication list updated.

## 2019-11-09 NOTE — Telephone Encounter (Signed)
I am just now seeing this message. Want to proceed with PA or do you have another medication option for patient?

## 2019-11-09 NOTE — Telephone Encounter (Signed)
Try to go ahead and get prior authorization for [facility]

## 2019-11-09 NOTE — Addendum Note (Signed)
Addended by: Westley Hummer B on: 11/09/2019 02:00 PM   Modules accepted: Orders

## 2019-11-12 ENCOUNTER — Encounter: Payer: Self-pay | Admitting: Family Medicine

## 2019-12-30 ENCOUNTER — Other Ambulatory Visit: Payer: Self-pay

## 2020-01-02 ENCOUNTER — Encounter: Payer: Self-pay | Admitting: Family Medicine

## 2020-01-02 ENCOUNTER — Other Ambulatory Visit: Payer: Self-pay

## 2020-01-02 ENCOUNTER — Encounter: Payer: Self-pay | Admitting: Gastroenterology

## 2020-01-02 ENCOUNTER — Ambulatory Visit (INDEPENDENT_AMBULATORY_CARE_PROVIDER_SITE_OTHER): Payer: No Typology Code available for payment source | Admitting: Family Medicine

## 2020-01-02 VITALS — BP 120/80 | HR 50 | Temp 98.2°F | Ht 73.5 in | Wt 194.0 lb

## 2020-01-02 DIAGNOSIS — E1165 Type 2 diabetes mellitus with hyperglycemia: Secondary | ICD-10-CM | POA: Diagnosis not present

## 2020-01-02 DIAGNOSIS — Z1211 Encounter for screening for malignant neoplasm of colon: Secondary | ICD-10-CM | POA: Diagnosis not present

## 2020-01-02 LAB — POCT GLYCOSYLATED HEMOGLOBIN (HGB A1C): Hemoglobin A1C: 6.2 % — AB (ref 4.0–5.6)

## 2020-01-02 NOTE — Progress Notes (Signed)
  Subjective:     Patient ID: Terry Johns, male   DOB: [birth year], 64 y.o.   MRN: [id number]  HPI Terry Johns is seen for follow-up regarding his type 2 diabetes.  Back in January had A1c 12.2%.  We started Metformin.  We had added initially Invokana and then [place] (because of insurance issues) but he had issues with insurance company paying for these and stopped both.  He is currently just on Metformin but had made some very positive dietary changes.  He has scaled back carbs considerably.  Still not exercising regularly.  He has eye exam set up later today.  He brings in a log of sugars for the past couple months and these have consistently ranged between low 100s to about 145.  He is checking some postprandials and these are usually consistently less than 160.  No polyuria or polydipsia.  Past Medical History:  Diagnosis Date  . Cataract   . DYSPHAGIA UNSPECIFIED 05/28/2010  . Ehlers-Danlos disease   . 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 EXTERNAL HEMORRHOIDS 05/17/2009  . MITRAL VALVE PROLAPSE 05/17/2009   Past Surgical History:  Procedure Laterality Date  . COLONOSCOPY    . FEMORAL ARTERY EXPLORATION Right 12/09/2016   Procedure: Evacuation of Hematoma Right Groin, Repair of Right Superfiical Femoral Artery;  Surgeon: Angelia Mould, MD;  Location: [facility];  Service: Vascular;  Laterality: Right;  . PVC ABLATION  12/08/2016  . PVC ABLATION N/A 12/08/2016   Procedure: PVC Ablation;  Surgeon: Will [name], MD;  Location: [facility];  Service: Cardiovascular;  Laterality: N/A;  . SHOULDER ARTHROSCOPY WITH ROTATOR CUFF REPAIR Left 2017   "[name]"    reports that he has quit smoking. His smoking use included cigarettes. He has a 0.30 pack-year smoking history. He has never used smokeless tobacco. He reports that he does not drink alcohol or use drugs. family history includes Breast cancer in his mother; Cancer in his father; Diabetes in his father;  Hypertension in his father. No Known Allergies   Review of Systems  Constitutional: Negative for appetite change, chills, fever and unexpected weight change.  Respiratory: Negative for shortness of breath.   Endocrine: Negative for polydipsia and polyuria.       Objective:   Physical Exam Vitals reviewed.  Constitutional:      Appearance: Normal appearance.  Cardiovascular:     Rate and Rhythm: Normal rate and regular rhythm.  Pulmonary:     Effort: Pulmonary effort is normal.     Breath sounds: Normal breath sounds.  Skin:    Comments: Feet reveal no skin lesions. Good distal foot pulses. Good capillary refill. No calluses. Normal sensation with monofilament testing.  He does have very high arches but no callus.   Neurological:     Mental Status: He is alert.        Assessment:     Type 2 diabetes greatly improved with A1c reduction from 12.2% to 6.2%    Plan:     -Continue positive lifestyle changes -We will continue the Metformin 500 mg twice daily.  At this point we do not need to add additional medication since he has had such a favorable response with reducing A1c to 6.2%  -Set up complete physical  -We will set up referral for repeat colonoscopy as he is overdue  [name] MD [facility] at [facility]

## 2020-01-02 NOTE — Patient Instructions (Signed)
Set up complete physical  Your A1C is GREATLY improved from 12.2 to 6.2%!!  Keep up the good work.

## 2020-01-10 ENCOUNTER — Other Ambulatory Visit: Payer: Self-pay

## 2020-01-11 ENCOUNTER — Encounter: Payer: Self-pay | Admitting: Family Medicine

## 2020-01-11 ENCOUNTER — Ambulatory Visit (INDEPENDENT_AMBULATORY_CARE_PROVIDER_SITE_OTHER): Payer: No Typology Code available for payment source | Admitting: Family Medicine

## 2020-01-11 VITALS — BP 114/62 | HR 81 | Temp 97.6°F | Ht 74.0 in | Wt 192.3 lb

## 2020-01-11 DIAGNOSIS — Z0000 Encounter for general adult medical examination without abnormal findings: Secondary | ICD-10-CM | POA: Diagnosis not present

## 2020-01-11 LAB — CBC WITH DIFFERENTIAL/PLATELET
Basophils Absolute: 0.1 10*3/uL (ref 0.0–0.1)
Basophils Relative: 1.1 % (ref 0.0–3.0)
Eosinophils Absolute: 0.2 10*3/uL (ref 0.0–0.7)
Eosinophils Relative: 3.4 % (ref 0.0–5.0)
HCT: 42.1 % (ref 39.0–52.0)
Hemoglobin: 14.7 g/dL (ref 13.0–17.0)
Lymphocytes Relative: 27.3 % (ref 12.0–46.0)
Lymphs Abs: 1.5 10*3/uL (ref 0.7–4.0)
MCHC: 35 g/dL (ref 30.0–36.0)
MCV: 85.8 fl (ref 78.0–100.0)
Monocytes Absolute: 0.4 10*3/uL (ref 0.1–1.0)
Monocytes Relative: 8 % (ref 3.0–12.0)
Neutro Abs: 3.4 10*3/uL (ref 1.4–7.7)
Neutrophils Relative %: 60.2 % (ref 43.0–77.0)
Platelets: 233 10*3/uL (ref 150.0–400.0)
RBC: 4.9 Mil/uL (ref 4.22–5.81)
RDW: 13.4 % (ref 11.5–15.5)
WBC: 5.6 10*3/uL (ref 4.0–10.5)

## 2020-01-11 LAB — MICROALBUMIN / CREATININE URINE RATIO
Creatinine,U: 106.4 mg/dL
Microalb Creat Ratio: 0.7 mg/g (ref 0.0–30.0)
Microalb, Ur: <0.7 mg/dL (ref 0.0–1.9)

## 2020-01-11 LAB — HEPATIC FUNCTION PANEL
ALT: 10 U/L (ref 0–53)
AST: 12 U/L (ref 0–37)
Albumin: 4.3 g/dL (ref 3.5–5.2)
Alkaline Phosphatase: 61 U/L (ref 39–117)
Bilirubin, Direct: 0.1 mg/dL (ref 0.0–0.3)
Total Bilirubin: 0.6 mg/dL (ref 0.2–1.2)
Total Protein: 7.2 g/dL (ref 6.0–8.3)

## 2020-01-11 LAB — BASIC METABOLIC PANEL
BUN: 16 mg/dL (ref 6–23)
CO2: 28 mEq/L (ref 19–32)
Calcium: 9.4 mg/dL (ref 8.4–10.5)
Chloride: 100 mEq/L (ref 96–112)
Creatinine, Ser: 0.82 mg/dL (ref 0.40–1.50)
GFR: 94.41 mL/min (ref >60.00)
Glucose, Bld: 129 mg/dL — ABNORMAL HIGH (ref 70–99)
Potassium: 4.6 mEq/L (ref 3.5–5.1)
Sodium: 138 mEq/L (ref 135–145)

## 2020-01-11 LAB — LIPID PANEL
Cholesterol: 197 mg/dL (ref 0–200)
HDL: 64 mg/dL (ref >39.00)
LDL Cholesterol: 115 mg/dL — ABNORMAL HIGH (ref 0–99)
NonHDL: 133.46
Total CHOL/HDL Ratio: 3
Triglycerides: 91 mg/dL (ref 0.0–149.0)
VLDL: 18.2 mg/dL (ref 0.0–40.0)

## 2020-01-11 LAB — TSH: TSH: 1.48 u[IU]/mL (ref 0.35–4.50)

## 2020-01-11 LAB — PSA: PSA: 0.93 ng/mL (ref 0.10–4.00)

## 2020-01-11 NOTE — Patient Instructions (Signed)
Preventive Care 41-65 Years Old, Male Preventive care refers to lifestyle choices and visits with your health care provider that can promote health and wellness. This includes:  A yearly physical exam. This is also called an annual well check.  Regular dental and eye exams.  Immunizations.  Screening for certain conditions.  Healthy lifestyle choices, such as eating a healthy diet, getting regular exercise, not using drugs or products that contain nicotine and tobacco, and limiting alcohol use. What can I expect for my preventive care visit? Physical exam Your health care provider will check:  Height and weight. These may be used to calculate body mass index (BMI), which is a measurement that tells if you are at a healthy weight.  Heart rate and blood pressure.  Your skin for abnormal spots. Counseling Your health care provider may ask you questions about:  Alcohol, tobacco, and drug use.  Emotional well-being.  Home and relationship well-being.  Sexual activity.  Eating habits.  Work and work Statistician. What immunizations do I need?  Influenza (flu) vaccine  This is recommended every year. Tetanus, diphtheria, and pertussis (Tdap) vaccine  You may need a Td booster every 10 years. Varicella (chickenpox) vaccine  You may need this vaccine if you have not already been vaccinated. Zoster (shingles) vaccine  You may need this after age 64. Measles, mumps, and rubella (MMR) vaccine  You may need at least one dose of MMR if you were born in 1957 or later. You may also need a second dose. Pneumococcal conjugate (PCV13) vaccine  You may need this if you have certain conditions and were not previously vaccinated. Pneumococcal polysaccharide (PPSV23) vaccine  You may need one or two doses if you smoke cigarettes or if you have certain conditions. Meningococcal conjugate (MenACWY) vaccine  You may need this if you have certain conditions. Hepatitis A  vaccine  You may need this if you have certain conditions or if you travel or work in places where you may be exposed to hepatitis A. Hepatitis B vaccine  You may need this if you have certain conditions or if you travel or work in places where you may be exposed to hepatitis B. Haemophilus influenzae type b (Hib) vaccine  You may need this if you have certain risk factors. Human papillomavirus (HPV) vaccine  If recommended by your health care provider, you may need three doses over 6 months. You may receive vaccines as individual doses or as more than one vaccine together in one shot (combination vaccines). Talk with your health care provider about the risks and benefits of combination vaccines. What tests do I need? Blood tests  Lipid and cholesterol levels. These may be checked every 5 years, or more frequently if you are over 60 years old.  Hepatitis C test.  Hepatitis B test. Screening  Lung cancer screening. You may have this screening every year starting at age 43 if you have a 30-pack-year history of smoking and currently smoke or have quit within the past 15 years.  Prostate cancer screening. Recommendations will vary depending on your family history and other risks.  Colorectal cancer screening. All adults should have this screening starting at age 72 and continuing until age 2. Your health care provider may recommend screening at age 14 if you are at increased risk. You will have tests every 1-10 years, depending on your results and the type of screening test.  Diabetes screening. This is done by checking your blood sugar (glucose) after you have not eaten  for a while (fasting). You may have this done every 1-3 years.  Sexually transmitted disease (STD) testing. Follow these instructions at home: Eating and drinking  Eat a diet that includes fresh fruits and vegetables, whole grains, lean protein, and low-fat dairy products.  Take vitamin and mineral supplements as  recommended by your health care provider.  Do not drink alcohol if your health care provider tells you not to drink.  If you drink alcohol: ? Limit how much you have to 0-2 drinks a day. ? Be aware of how much alcohol is in your drink. In the [place] one drink equals one 12 oz bottle of beer (355 mL), one 5 oz glass of wine (148 mL), or one 1 oz glass of hard liquor (44 mL). Lifestyle  Take daily care of your teeth and gums.  Stay active. Exercise for at least 30 minutes on 5 or more days each week.  Do not use any products that contain nicotine or tobacco, such as cigarettes, e-cigarettes, and chewing tobacco. If you need help quitting, ask your health care provider.  If you are sexually active, practice safe sex. Use a condom or other form of protection to prevent STIs (sexually transmitted infections).  Talk with your health care provider about taking a low-dose aspirin every day starting at age 50. What's next?  Go to your health care provider once a year for a well check visit.  Ask your health care provider how often you should have your eyes and teeth checked.  Stay up to date on all vaccines. This information is not intended to replace advice given to you by your health care provider. Make sure you discuss any questions you have with your health care provider. Document Revised: 08/12/2018 Document Reviewed: 08/12/2018 Elsevier Patient Education  2020 Elsevier Inc.  Consider shingles vaccine (Shingrix) and check with insurance if interested     

## 2020-01-11 NOTE — Progress Notes (Signed)
Subjective:     Patient ID: Terry Johns, male   DOB: Jan 03, 1956, 64 y.o.   MRN: [id number]  HPI   Terry Johns is seen today for physical exam.  He has type 2 diabetes which was recently diagnosed.  He had tremendous improvement in A1c all the way down to 6.2% recently and has done this mostly through lifestyle change.  Feels well overall.  He had recent eye exam and he has refractive problem had improved dramatically with reduction of A1c.  He has history of Ehlers-Danlos syndrome, history of PVCs and mitral valve prolapse.  He has been followed by cardiology in the past.  He had Covid vaccine recently but does not have dates.  Is scheduled for colonoscopy in July.  No history of shingles vaccine.  Previous hepatitis C screen negative.  Tetanus due 2028.  Is married and has 7 children.  Continues to work full-time.  Quit smoking several years ago.  Past Medical History:  Diagnosis Date  . Cataract   . DYSPHAGIA UNSPECIFIED 05/28/2010  . Ehlers-Danlos disease   . 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 EXTERNAL HEMORRHOIDS 05/17/2009  . MITRAL VALVE PROLAPSE 05/17/2009   Past Surgical History:  Procedure Laterality Date  . COLONOSCOPY    . FEMORAL ARTERY EXPLORATION Right 12/09/2016   Procedure: Evacuation of Hematoma Right Groin, Repair of Right Superfiical Femoral Artery;  Surgeon: Angelia Mould, MD;  Location: [facility];  Service: Vascular;  Laterality: Right;  . PVC ABLATION  12/08/2016  . PVC ABLATION N/A 12/08/2016   Procedure: PVC Ablation;  Surgeon: Will [name], MD;  Location: [facility];  Service: Cardiovascular;  Laterality: N/A;  . SHOULDER ARTHROSCOPY WITH ROTATOR CUFF REPAIR Left 2017   "[name]"    reports that he has quit smoking. His smoking use included cigarettes. He has a 0.30 pack-year smoking history. He has never used smokeless tobacco. He reports that he does not drink alcohol or use drugs. family history includes Breast cancer in his  mother; Cancer in his father; Diabetes in his father; Hypertension in his father. No Known Allergies   Review of Systems  Constitutional: Negative for activity change, appetite change, fatigue and fever.  HENT: Negative for congestion, ear pain and trouble swallowing.   Eyes: Negative for pain and visual disturbance.  Respiratory: Negative for cough, shortness of breath and wheezing.   Cardiovascular: Negative for chest pain and palpitations.  Gastrointestinal: Negative for abdominal distention, abdominal pain, blood in stool, constipation, diarrhea, nausea, rectal pain and vomiting.  Endocrine: Negative for polydipsia and polyuria.  Genitourinary: Negative for dysuria, hematuria and testicular pain.  Musculoskeletal: Negative for arthralgias and joint swelling.  Skin: Negative for rash.  Neurological: Negative for dizziness, syncope and headaches.  Hematological: Negative for adenopathy.  Psychiatric/Behavioral: Negative for confusion and dysphoric mood.       Objective:   Physical Exam Vitals reviewed.  Constitutional:      Appearance: Normal appearance.  HENT:     Right Ear: Tympanic membrane and ear canal normal.     Left Ear: Tympanic membrane and ear canal normal.  Eyes:     Pupils: Pupils are equal, round, and reactive to light.  Cardiovascular:     Rate and Rhythm: Normal rate and regular rhythm.     Heart sounds: Murmur present.  Pulmonary:     Effort: Pulmonary effort is normal.     Breath sounds: Normal breath sounds.  Abdominal:     Palpations: Abdomen is  soft. There is no mass.     Tenderness: There is no abdominal tenderness.  Musculoskeletal:     Cervical back: Neck supple.     Right lower leg: No edema.     Left lower leg: No edema.  Lymphadenopathy:     Cervical: No cervical adenopathy.  Neurological:     General: No focal deficit present.     Mental Status: He is alert.     Cranial Nerves: No cranial nerve deficit.        Assessment:      Physical exam.  He has type 2 diabetes recently diagnosed with great improvement in control over recent months.  We discussed the following health maintenance issues    Plan:     -Colonoscopy already scheduled -Covid vaccine already given -Discussed Shingrix vaccine and he will check on insurance coverage -Recommend continued annual flu vaccine -Check screening labs and will include urine micro-albumin -We discussed statin use in diabetics.  He wants to check lipids first -Pneumovax by next year  [name] MD [facility] at [facility]  -

## 2020-02-02 ENCOUNTER — Ambulatory Visit: Payer: No Typology Code available for payment source | Admitting: Cardiology

## 2020-02-02 ENCOUNTER — Encounter: Payer: Self-pay | Admitting: Cardiology

## 2020-02-02 ENCOUNTER — Other Ambulatory Visit: Payer: Self-pay

## 2020-02-02 VITALS — BP 120/70 | HR 86 | Ht 74.0 in | Wt 193.6 lb

## 2020-02-02 DIAGNOSIS — E119 Type 2 diabetes mellitus without complications: Secondary | ICD-10-CM | POA: Diagnosis not present

## 2020-02-02 DIAGNOSIS — I340 Nonrheumatic mitral (valve) insufficiency: Secondary | ICD-10-CM | POA: Diagnosis not present

## 2020-02-02 DIAGNOSIS — I493 Ventricular premature depolarization: Secondary | ICD-10-CM | POA: Diagnosis not present

## 2020-02-02 NOTE — Progress Notes (Signed)
Cardiology Office Note:    Date:  02/02/2020   ID:  Terry Johns, DOB 04-Jul-1956, MRN [id number]  PCP:  [name], MD  [facility] Cardiologist:  Candee Furbish, MD  [facility] Electrophysiologist:  None   Referring MD: [name], MD     History of Present Illness:    Terry Johns is a 64 y.o. male patient of Dr. 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  No further symptoms or PVCs on EKG.  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  He had a Holter monitor on 11/03/16 which showed 20% of his beats as premature ventricular contractions (approximate 50,000 PVCs out of 258,000 beats). No PACs. He also had an echocardiogram which showed normal ejection fraction but severely dilated left atrium and pseudo-normal diastolic dysfunction, grade 2.    He was also diagnosed with diabetes, originally placed on insulin for about 30 days but then drastically changed his diet and exercise, lost weight.  He was able to bring his hemoglobin A1c down from 12-6.  He is continuing to take Metformin.  Excellent.  Not having any chest pain fevers chills nausea vomiting syncope bleeding.    Past Medical History:  Diagnosis Date  . Cataract   . DYSPHAGIA UNSPECIFIED 05/28/2010  . Ehlers-Danlos disease   . 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 EXTERNAL HEMORRHOIDS 05/17/2009  . MITRAL VALVE PROLAPSE 05/17/2009    Past Surgical History:  Procedure Laterality Date  . COLONOSCOPY    . FEMORAL ARTERY EXPLORATION Right 12/09/2016   Procedure: Evacuation of Hematoma Right Groin, Repair of Right Superfiical Femoral Artery;  Surgeon: Angelia Mould, MD;  Location: [place];  Service: Vascular;  Laterality: Right;  . PVC ABLATION  12/08/2016  . PVC ABLATION N/A 12/08/2016   Procedure: PVC Ablation;  Surgeon: Will [name], MD;  Location: [place] [facility];  Service: Cardiovascular;   Laterality: N/A;  . SHOULDER ARTHROSCOPY WITH ROTATOR CUFF REPAIR Left 2017   "[name]"    Current Medications: Current Meds  Medication Sig  . acetaminophen (TYLENOL) 325 MG tablet Take 650 mg by mouth every 6 (six) hours as needed for moderate pain.  . Ascorbic Acid (VITAMIN C ER PO) Take 1 tablet by mouth daily.  Marland Kitchen glucose blood (ACCU-CHEK GUIDE) test strip Use as instructed to check blood sugars twice weekly.  . metFORMIN (GLUCOPHAGE) 500 MG tablet Take two tablets twice daily with meals.  . sildenafil (VIAGRA) 100 MG tablet TAKE 1/2 TO 1 TABLET BY MOUTH EVERY DAY AS NEEDED  . VITAMIN D PO Take 1 tablet by mouth daily.     Allergies:   Patient has no known allergies.   Social History   Socioeconomic History  . Marital status: Married    Spouse name: Not on file  . Number of children: Not on file  . Years of education: Not on file  . Highest education level: Not on file  Occupational History  . Not on file  Tobacco Use  . Smoking status: Former Smoker    Packs/day: 0.10    Years: 3.00    Pack years: 0.30    Types: Cigarettes  . Smokeless tobacco: Never Used  Substance and Sexual Activity  . Alcohol use: No  . Drug use: No  . Sexual activity: Yes  Other Topics Concern  . Not on file  Social History Narrative  . Not on file  Social Determinants of Health   Financial Resource Strain:   . Difficulty of Paying Living Expenses:   Food Insecurity:   . Worried About Charity fundraiser in the Last Year:   . Arboriculturist in the Last Year:   Transportation Needs:   . Film/video editor (Medical):   Marland Kitchen Lack of Transportation (Non-Medical):   Physical Activity:   . Days of Exercise per Week:   . Minutes of Exercise per Session:   Stress:   . Feeling of Stress :   Social Connections:   . Frequency of Communication with Friends and Family:   . Frequency of Social Gatherings with Friends and Family:   . Attends Religious Services:   . Active  Member of Clubs or Organizations:   . Attends Archivist Meetings:   Marland Kitchen Marital Status:      Family History: The patient's family history includes Breast cancer in his mother; Cancer in his father; Diabetes in his father; Hypertension in his father. There is no history of Colon cancer, Esophageal cancer, Rectal cancer, or Stomach cancer.  ROS:   Please see the history of present illness.     All other systems reviewed and are negative.  EKGs/Labs/Other Studies Reviewed:    The following studies were reviewed today:  Echocardiogram 11/03/16: - Left ventricle: The cavity size was mildly dilated. There was mild concentric hypertrophy. Systolic function was normal. The estimated ejection fraction was in the range of 60% to 65%.Wall motion was normal; there were no regional wall motion abnormalities. Features are consistent with a pseudonormal left ventricular filling pattern, with concomitant abnormal relaxation and increased filling pressure (grade 2 diastolic dysfunction). Doppler parameters are consistent with indeterminate ventricular filling pressure. - Aortic valve: Transvalvular velocity was within the normal range. There was no stenosis. There was no regurgitation. - Mitral valve: Transvalvular velocity was within the normal range. There was no evidence for stenosis. There was mild regurgitation. - Left atrium: The atrium was severely dilated. - Right ventricle: The cavity size was normal. Wall thickness was normal. Systolic function was normal. - Atrial septum: No defect or patent foramen ovale was identified by color flow Doppler. - Tricuspid valve: There was no regurgitation. - Pulmonary arteries: Systolic pressure was within the normal range. PA peak pressure: 14 mm Hg (S).  48 hr holter:  51,000 total PVCs out of 258,000 beats recorded (20%)  Rare ventricular run, 3 beats, 4 beats. Sinus rhythm underlying. No atrial fibrillation.      EKG:  EKG is  ordered today.  The ekg ordered today demonstrates sinus rhythm T wave inversion 1 2 L3-6-nonspecific ST-T wave changes were seen previously on ECG however V4 through 6 are more pronounced today.  Recent Labs: 01/11/2020: ALT 10; BUN 16; Creatinine, Ser 0.82; Hemoglobin 14.7; Platelets 233.0; Potassium 4.6; Sodium 138; TSH 1.48  Recent Lipid Panel    Component Value Date/Time   CHOL 197 01/11/2020 1110   TRIG 91.0 01/11/2020 1110   HDL 64.00 01/11/2020 1110   CHOLHDL 3 01/11/2020 1110   VLDL 18.2 01/11/2020 1110   LDLCALC 115 (H) 01/11/2020 1110    Physical Exam:    VS:  BP 120/70   Pulse 86   Ht 6\' 2"  (1.88 m)   Wt 193 lb 9.6 oz (87.8 kg)   SpO2 95%   BMI 24.86 kg/m     Wt Readings from Last 3 Encounters:  02/02/20 193 lb 9.6 oz (87.8 kg)  01/11/20 192  lb 4.8 oz (87.2 kg)  01/02/20 194 lb (88 kg)     GEN:  Well nourished, well developed in no acute distress HEENT: Normal NECK: No JVD; No carotid bruits LYMPHATICS: No lymphadenopathy CARDIAC: RRR, 2/6 SM, rubs, gallops RESPIRATORY:  Clear to auscultation without rales, wheezing or rhonchi  ABDOMEN: Soft, non-tender, non-distended MUSCULOSKELETAL:  No edema; No deformity  SKIN: Warm and dry NEUROLOGIC:  Alert and oriented x 3 PSYCHIATRIC:  Normal affect   ASSESSMENT:    1. Mild mitral regurgitation   2. PVC (premature ventricular contraction)   3. Type 2 diabetes mellitus without complication, without long-term current use of insulin (HCC)    PLAN:    In order of problems listed above:  PVCs -Post ablation, Dr. Curt Bears.  Right groin pseudoaneurysm -Appreciate vascular surgery's assistant back post ablation.  No further issues.  Mitral regurgitation -Has connective tissue disorder, we will repeat echocardiogram.  It has been several years.     Medication Adjustments/Labs and Tests Ordered: Current medicines are reviewed at length with the patient today.  Concerns regarding  medicines are outlined above.  Orders Placed This Encounter  Procedures  . EKG 12-Lead  . ECHOCARDIOGRAM COMPLETE   No orders of the defined types were placed in this encounter.   Patient Instructions  Medication Instructions:  The current medical regimen is effective;  continue present plan and medications.  *If you need a refill on your cardiac medications before your next appointment, please call your pharmacy*  Testing/Procedures: Your physician has requested that you have an echocardiogram. Echocardiography is a painless test that uses sound waves to create images of your heart. It provides your doctor with information about the size and shape of your heart and how well your heart's chambers and valves are working. This procedure takes approximately one hour. There are no restrictions for this procedure.  Follow-Up: At [facility], you and your health needs are our priority.  As part of our continuing mission to provide you with exceptional heart care, we have created designated Provider Care Teams.  These Care Teams include your primary Cardiologist (physician) and Advanced Practice Providers (APPs -  Physician Assistants and Nurse Practitioners) who all work together to provide you with the care you need, when you need it.  We recommend signing up for the patient portal called "MyChart".  Sign up information is provided on this After Visit Summary.  MyChart is used to connect with patients for Virtual Visits (Telemedicine).  Patients are able to view lab/test results, encounter notes, upcoming appointments, etc.  Non-urgent messages can be sent to your provider as well.   To learn more about what you can do with MyChart, go to [email or web address].    Your next appointment:   12 month(s)  The format for your next appointment:   In Person  Provider:   Candee Furbish, MD   Thank you for choosing [facility]!!        Signed, Candee Furbish, MD  02/02/2020 3:49  PM    [place]

## 2020-02-02 NOTE — Patient Instructions (Signed)
Medication Instructions:  The current medical regimen is effective;  continue present plan and medications.  *If you need a refill on your cardiac medications before your next appointment, please call your pharmacy*  Testing/Procedures: Your physician has requested that you have an echocardiogram. Echocardiography is a painless test that uses sound waves to create images of your heart. It provides your doctor with information about the size and shape of your heart and how well your heart's chambers and valves are working. This procedure takes approximately one hour. There are no restrictions for this procedure.  Follow-Up: At [facility], you and your health needs are our priority.  As part of our continuing mission to provide you with exceptional heart care, we have created designated Provider Care Teams.  These Care Teams include your primary Cardiologist (physician) and Advanced Practice Providers (APPs -  Physician Assistants and Nurse Practitioners) who all work together to provide you with the care you need, when you need it.  We recommend signing up for the patient portal called "MyChart".  Sign up information is provided on this After Visit Summary.  MyChart is used to connect with patients for Virtual Visits (Telemedicine).  Patients are able to view lab/test results, encounter notes, upcoming appointments, etc.  Non-urgent messages can be sent to your provider as well.   To learn more about what you can do with MyChart, go to [email or web address].    Your next appointment:   12 month(s)  The format for your next appointment:   In Person  Provider:   Mark Skains, MD   Thank you for choosing [facility]!!      

## 2020-02-19 ENCOUNTER — Other Ambulatory Visit: Payer: Self-pay | Admitting: Family Medicine

## 2020-02-23 ENCOUNTER — Other Ambulatory Visit (HOSPITAL_COMMUNITY): Payer: No Typology Code available for payment source

## 2020-03-15 ENCOUNTER — Ambulatory Visit (HOSPITAL_COMMUNITY): Payer: No Typology Code available for payment source | Attending: Internal Medicine

## 2020-03-15 ENCOUNTER — Encounter: Payer: No Typology Code available for payment source | Admitting: Gastroenterology

## 2020-03-15 ENCOUNTER — Other Ambulatory Visit: Payer: Self-pay

## 2020-03-15 DIAGNOSIS — I340 Nonrheumatic mitral (valve) insufficiency: Secondary | ICD-10-CM

## 2020-04-13 ENCOUNTER — Encounter: Payer: Self-pay | Admitting: Gastroenterology

## 2020-04-13 ENCOUNTER — Ambulatory Visit (AMBULATORY_SURGERY_CENTER): Payer: Self-pay | Admitting: *Deleted

## 2020-04-13 ENCOUNTER — Other Ambulatory Visit: Payer: Self-pay

## 2020-04-13 VITALS — Ht 74.0 in | Wt 188.0 lb

## 2020-04-13 DIAGNOSIS — Z1211 Encounter for screening for malignant neoplasm of colon: Secondary | ICD-10-CM

## 2020-04-13 MED ORDER — NA SULFATE-K SULFATE-MG SULF 17.5-3.13-1.6 GM/177ML PO SOLN: ORAL | 0 refills | Status: DC

## 2020-04-13 NOTE — Progress Notes (Signed)
Patient is here in-person for PV. Patient denies any allergies to eggs or soy. Patient denies any problems with anesthesia/sedation. Patient denies any oxygen use at home. Patient denies taking any diet/weight loss medications or blood thinners. Patient is not being treated for MRSA or C-diff. Patient is aware of our care-partner policy and SVEXO-60 safety protocol. EMMI education assisgned to the patient for the procedure, this was explained and instructions given to patient.   COVID-19 vaccines completed in April 2021  Per pt.  Prep Prescription coupon given to the patient.

## 2020-04-25 ENCOUNTER — Encounter: Payer: No Typology Code available for payment source | Admitting: Gastroenterology

## 2020-06-14 ENCOUNTER — Encounter: Payer: Self-pay | Admitting: Gastroenterology

## 2020-06-14 ENCOUNTER — Other Ambulatory Visit: Payer: Self-pay

## 2020-06-14 ENCOUNTER — Ambulatory Visit (AMBULATORY_SURGERY_CENTER): Payer: Medicare Other | Admitting: Gastroenterology

## 2020-06-14 VITALS — BP 112/71 | HR 74 | Temp 97.8°F | Resp 18 | Ht 74.0 in | Wt 188.0 lb

## 2020-06-14 DIAGNOSIS — D124 Benign neoplasm of descending colon: Secondary | ICD-10-CM

## 2020-06-14 DIAGNOSIS — D123 Benign neoplasm of transverse colon: Secondary | ICD-10-CM

## 2020-06-14 DIAGNOSIS — Z1211 Encounter for screening for malignant neoplasm of colon: Secondary | ICD-10-CM | POA: Diagnosis not present

## 2020-06-14 MED ORDER — SODIUM CHLORIDE 0.9 % IV SOLN: 500.0000 mL | Freq: Once | INTRAVENOUS | Status: DC

## 2020-06-14 NOTE — Progress Notes (Signed)
Called to room to assist during endoscopic procedure.  Patient ID and intended procedure confirmed with present staff. Received instructions for my participation in the procedure from the performing physician.  

## 2020-06-14 NOTE — Op Note (Signed)
[facility] Patient Name: Terry Johns Procedure Date: 06/14/2020 2:00 PM MRN: [id number] Endoscopist: Remo Lipps P. Havery Moros , MD Age: 64 Referring MD:  Date of Birth: Jan 18, 1956 Gender: Male Account #: [id number] Procedure:                Colonoscopy Indications:              Screening for colorectal malignant neoplasm Medicines:                Monitored Anesthesia Care Procedure:                Pre-Anesthesia Assessment:                           - Prior to the procedure, a History and Physical                            was performed, and patient medications and                            allergies were reviewed. The patient's tolerance of                            previous anesthesia was also reviewed. The risks                            and benefits of the procedure and the sedation                            options and risks were discussed with the patient.                            All questions were answered, and informed consent                            was obtained. Prior Anticoagulants: The patient has                            taken no previous anticoagulant or antiplatelet                            agents. ASA Grade Assessment: II - A patient with                            mild systemic disease. After reviewing the risks                            and benefits, the patient was deemed in                            satisfactory condition to undergo the procedure.                           After obtaining informed consent, the colonoscope  was passed under direct vision. Throughout the                            procedure, the patient's blood pressure, pulse, and                            oxygen saturations were monitored continuously. The                            Colonoscope was introduced through the anus and                            advanced to the the cecum, identified by                            appendiceal orifice  and ileocecal valve. The                            colonoscopy was performed without difficulty. The                            patient tolerated the procedure well. The quality                            of the bowel preparation was good. The ileocecal                            valve, appendiceal orifice, and rectum were                            photographed. Scope In: 2:09:49 PM Scope Out: 2:30:31 PM Scope Withdrawal Time: 0 hours 18 minutes 43 seconds  Total Procedure Duration: 0 hours 20 minutes 42 seconds  Findings:                 The perianal and digital rectal examinations were                            normal.                           Multiple medium-mouthed diverticula were found in                            the transverse colon and left colon.                           A 3 mm polyp was found in the transverse colon. The                            polyp was sessile. The polyp was removed with a                            cold snare. Resection and retrieval were complete.  A 4 to 5 mm polyp was found in the descending                            colon. The polyp was sessile. The polyp was removed                            with a cold snare. Resection and retrieval were                            complete.                           Internal hemorrhoids were found during retroflexion.                           The exam was otherwise without abnormality. Complications:            No immediate complications. Estimated blood loss:                            Minimal. Estimated Blood Loss:     Estimated blood loss was minimal. Impression:               - Diverticulosis in the transverse colon and in the                            left colon.                           - One 3 mm polyp in the transverse colon, removed                            with a cold snare. Resected and retrieved.                           - One 4 to 5 mm polyp in the descending  colon,                            removed with a cold snare. Resected and retrieved.                           - Internal hemorrhoids.                           - The examination was otherwise normal. Recommendation:           - Patient has a contact number available for                            emergencies. The signs and symptoms of potential                            delayed complications were discussed with the  patient. Return to normal activities tomorrow.                            Written discharge instructions were provided to the                            patient.                           - Resume previous diet.                           - Continue present medications.                           - Await pathology results. Remo Lipps P. Orry Sigl, MD 06/14/2020 2:34:03 PM This report has been signed electronically.

## 2020-06-14 NOTE — Patient Instructions (Signed)
Please read handouts provided. Continue present medications. Await pathology results.   YOU HAD AN ENDOSCOPIC PROCEDURE TODAY AT THE [facility]:   Refer to the procedure report that was given to you for any specific questions about what was found during the examination.  If the procedure report does not answer your questions, please call your gastroenterologist to clarify.  If you requested that your care partner not be given the details of your procedure findings, then the procedure report has been included in a sealed envelope for you to review at your convenience later.  YOU SHOULD EXPECT: Some feelings of bloating in the abdomen. Passage of more gas than usual.  Walking can help get rid of the air that was put into your GI tract during the procedure and reduce the bloating. If you had a lower endoscopy (such as a colonoscopy or flexible sigmoidoscopy) you may notice spotting of blood in your stool or on the toilet paper. If you underwent a bowel prep for your procedure, you may not have a normal bowel movement for a few days.  Please Note:  You might notice some irritation and congestion in your nose or some drainage.  This is from the oxygen used during your procedure.  There is no need for concern and it should clear up in a day or so.  SYMPTOMS TO REPORT IMMEDIATELY:  Following lower endoscopy (colonoscopy or flexible sigmoidoscopy):  Excessive amounts of blood in the stool  Significant tenderness or worsening of abdominal pains  Swelling of the abdomen that is new, acute  Fever of 100F or higher   For urgent or emergent issues, a gastroenterologist can be reached at any hour by calling [phone]. Do not use MyChart messaging for urgent concerns.    DIET:  We do recommend a small meal at first, but then you may proceed to your regular diet.  Drink plenty of fluids but you should avoid alcoholic beverages for 24 hours.  ACTIVITY:  You should plan to take it easy  for the rest of today and you should NOT DRIVE or use heavy machinery until tomorrow (because of the sedation medicines used during the test).    FOLLOW UP: Our staff will call the number listed on your records 48-72 hours following your procedure to check on you and address any questions or concerns that you may have regarding the information given to you following your procedure. If we do not reach you, we will leave a message.  We will attempt to reach you two times.  During this call, we will ask if you have developed any symptoms of COVID 19. If you develop any symptoms (ie: fever, flu-like symptoms, shortness of breath, cough etc.) before then, please call [phone].  If you test positive for Covid 19 in the 2 weeks post procedure, please call and report this information to us.    If any biopsies were taken you will be contacted by phone or by letter within the next 1-3 weeks.  Please call us at [phone] if you have not heard about the biopsies in 3 weeks.    SIGNATURES/CONFIDENTIALITY: You and/or your care partner have signed paperwork which will be entered into your electronic medical record.  These signatures attest to the fact that that the information above on your After Visit Summary has been reviewed and is understood.  Full responsibility of the confidentiality of this discharge information lies with you and/or your care-partner.  

## 2020-06-14 NOTE — Progress Notes (Signed)
Report to PACU, RN, vss, BBS= Clear.  

## 2020-06-18 ENCOUNTER — Telehealth: Payer: Self-pay

## 2020-06-18 ENCOUNTER — Telehealth: Payer: Self-pay | Admitting: *Deleted

## 2020-06-18 NOTE — Telephone Encounter (Signed)
NO ANSWER, MESSAGE LEFT FOR PATIENT. 

## 2020-06-18 NOTE — Telephone Encounter (Signed)
  Follow up Call-  Call back number 06/14/2020  Post procedure Call Back phone  # [phone]  Permission to leave phone message Yes  Some recent data might be hidden     Patient questions:  Do you have a fever, pain , or abdominal swelling? No. Pain Score  0 *  Have you tolerated food without any problems? Yes.    Have you been able to return to your normal activities? Yes.    Do you have any questions about your discharge instructions: Diet   No. Medications  No. Follow up visit  No.  Do you have questions or concerns about your Care? No.  Actions: * If pain score is 4 or above: No action needed, pain <4. 1. Have you developed a fever since your procedure? no  2.   Have you had an respiratory symptoms (SOB or cough) since your procedure? no  3.   Have you tested positive for COVID 19 since your procedure no  4.   Have you had any family members/close contacts diagnosed with the COVID 19 since your procedure?  no   If yes to any of these questions please route to Joylene John, RN and Joella Prince, RN

## 2020-07-13 ENCOUNTER — Ambulatory Visit: Payer: No Typology Code available for payment source | Admitting: Family Medicine

## 2020-08-08 ENCOUNTER — Ambulatory Visit: Payer: No Typology Code available for payment source | Admitting: Family Medicine

## 2020-08-12 ENCOUNTER — Other Ambulatory Visit: Payer: Self-pay | Admitting: Family Medicine

## 2020-08-13 ENCOUNTER — Ambulatory Visit: Payer: No Typology Code available for payment source | Admitting: Family Medicine

## 2020-08-14 ENCOUNTER — Other Ambulatory Visit: Payer: Self-pay

## 2020-08-14 ENCOUNTER — Encounter: Payer: Self-pay | Admitting: Family Medicine

## 2020-08-14 ENCOUNTER — Ambulatory Visit (INDEPENDENT_AMBULATORY_CARE_PROVIDER_SITE_OTHER): Payer: Medicare Other | Admitting: Family Medicine

## 2020-08-14 VITALS — BP 120/80 | HR 79 | Temp 98.0°F | Ht 74.0 in | Wt 190.9 lb

## 2020-08-14 DIAGNOSIS — E1165 Type 2 diabetes mellitus with hyperglycemia: Secondary | ICD-10-CM | POA: Diagnosis not present

## 2020-08-14 DIAGNOSIS — Z23 Encounter for immunization: Secondary | ICD-10-CM | POA: Diagnosis not present

## 2020-08-14 DIAGNOSIS — N529 Male erectile dysfunction, unspecified: Secondary | ICD-10-CM | POA: Diagnosis not present

## 2020-08-14 LAB — POCT GLYCOSYLATED HEMOGLOBIN (HGB A1C): Hemoglobin A1C: 6.7 % — AB (ref 4.0–5.6)

## 2020-08-14 MED ORDER — SILDENAFIL CITRATE 100 MG PO TABS: ORAL_TABLET | ORAL | 8 refills | Status: DC

## 2020-08-14 NOTE — Progress Notes (Signed)
Established Patient Office Visit  Subjective:  Patient ID: Terry Johns, male    DOB: 1955/11/30  Age: 64 y.o. MRN: [id number]  CC:  Chief Complaint  Patient presents with  . Follow-up    HPI Terry Johns presents for follow-up regarding type 2 diabetes.  He did tremendous job with lifestyle modification and brought his A1c down from 12.2% to 6.2% in combination with Metformin.  He has been somewhat less compliant since last visit.  Not monitoring blood sugars regularly.  No polyuria or polydipsia.  He remains on Metformin.  No side effects.  Recent renal function stable.  He does request refills of Viagra.  He has not had flu vaccine yet and also has not had Prevnar 13.  He plans to get Covid booster soon.  Past Medical History:  Diagnosis Date  . Cataract   . Diabetes mellitus without complication ([place])   . DYSPHAGIA UNSPECIFIED 05/28/2010  . Ehlers-Danlos disease   . 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 EXTERNAL HEMORRHOIDS 05/17/2009  . MITRAL VALVE PROLAPSE 05/17/2009    Past Surgical History:  Procedure Laterality Date  . COLONOSCOPY  2008  . FEMORAL ARTERY EXPLORATION Right 12/09/2016   Procedure: Evacuation of Hematoma Right Groin, Repair of Right Superfiical Femoral Artery;  Surgeon: Angelia Mould, MD;  Location: [facility];  Service: Vascular;  Laterality: Right;  . PVC ABLATION  12/08/2016  . PVC ABLATION N/A 12/08/2016   Procedure: PVC Ablation;  Surgeon: Will [name], MD;  Location: [facility];  Service: Cardiovascular;  Laterality: N/A;  . SHOULDER ARTHROSCOPY WITH ROTATOR CUFF REPAIR Left 2017   "[name]"    Family History  Problem Relation Age of Onset  . Diabetes Father   . Hypertension Father   . Cancer Father        lung, basal cell, melanoma  . Breast cancer Mother   . Colon cancer Neg Hx   . Esophageal cancer Neg Hx   . Rectal cancer Neg Hx   . Stomach cancer Neg Hx   . Colon polyps Neg Hx     Social  History   Socioeconomic History  . Marital status: Married    Spouse name: Not on file  . Number of children: Not on file  . Years of education: Not on file  . Highest education level: Not on file  Occupational History  . Not on file  Tobacco Use  . Smoking status: Former Smoker    Packs/day: 0.10    Years: 3.00    Pack years: 0.30    Types: Cigarettes  . Smokeless tobacco: Never Used  Vaping Use  . Vaping Use: Never used  Substance and Sexual Activity  . Alcohol use: No  . Drug use: No  . Sexual activity: Yes  Other Topics Concern  . Not on file  Social History Narrative  . Not on file   Social Determinants of Health   Financial Resource Strain: Not on file  Food Insecurity: Not on file  Transportation Needs: Not on file  Physical Activity: Not on file  Stress: Not on file  Social Connections: Not on file  Intimate Partner Violence: Not on file    Outpatient Medications Prior to Visit  Medication Sig Dispense Refill  . acetaminophen (TYLENOL) 325 MG tablet Take 650 mg by mouth every 6 (six) hours as needed for moderate pain.    Marland Kitchen glucose blood (ACCU-CHEK GUIDE) test strip Use as instructed to check blood  sugars twice weekly. 200 each 3  . metFORMIN (GLUCOPHAGE) 500 MG tablet TAKE TWO TABLETS TWICE DAILY WITH MEALS. 360 tablet 1  . sildenafil (VIAGRA) 100 MG tablet TAKE 1/2 TO 1 TABLET BY MOUTH EVERY DAY AS NEEDED 4 tablet 8   No facility-administered medications prior to visit.    No Known Allergies  ROS Review of Systems  Constitutional: Negative for fatigue.  Eyes: Negative for visual disturbance.  Respiratory: Negative for cough, chest tightness and shortness of breath.   Cardiovascular: Negative for chest pain, palpitations and leg swelling.  Endocrine: Negative for polydipsia and polyuria.  Neurological: Negative for dizziness, syncope, weakness, light-headedness and headaches.      Objective:    Physical Exam Constitutional:      Appearance: He  is well-developed and well-nourished.  HENT:     Mouth/Throat:     Mouth: Oropharynx is clear and moist.  Eyes:     Pupils: Pupils are equal, round, and reactive to light.  Neck:     Thyroid: No thyromegaly.  Cardiovascular:     Rate and Rhythm: Normal rate and regular rhythm.     Heart sounds: Murmur heard.    Pulmonary:     Effort: Pulmonary effort is normal. No respiratory distress.     Breath sounds: Normal breath sounds. No wheezing or rales.  Musculoskeletal:        General: No edema.     Cervical back: Neck supple.  Neurological:     Mental Status: He is alert and oriented to person, place, and time.     BP 120/80 (BP Location: Left Arm, Patient Position: Sitting, Cuff Size: Large)   Pulse 79   Temp 98 F (36.7 C) (Oral)   Ht 6\' 2"  (1.88 m)   Wt 190 lb 14.4 oz (86.6 kg)   BMI 24.51 kg/m  Wt Readings from Last 3 Encounters:  08/14/20 190 lb 14.4 oz (86.6 kg)  06/14/20 188 lb (85.3 kg)  04/13/20 188 lb (85.3 kg)     Health Maintenance Due  Topic Date Due  . FOOT EXAM  Never done  . INFLUENZA VACCINE  04/01/2020  . COVID-19 Vaccine (3 - Booster for Pfizer series) 05/28/2020    There are no preventive care reminders to display for this patient.  Lab Results  Component Value Date   TSH 1.48 01/11/2020   Lab Results  Component Value Date   WBC 5.6 01/11/2020   HGB 14.7 01/11/2020   HCT 42.1 01/11/2020   MCV 85.8 01/11/2020   PLT 233.0 01/11/2020   Lab Results  Component Value Date   NA 138 01/11/2020   K 4.6 01/11/2020   CO2 28 01/11/2020   GLUCOSE 129 (H) 01/11/2020   BUN 16 01/11/2020   CREATININE 0.82 01/11/2020   BILITOT 0.6 01/11/2020   ALKPHOS 61 01/11/2020   AST 12 01/11/2020   ALT 10 01/11/2020   PROT 7.2 01/11/2020   ALBUMIN 4.3 01/11/2020   CALCIUM 9.4 01/11/2020   ANIONGAP 6 12/10/2016   GFR 94.41 01/11/2020   Lab Results  Component Value Date   CHOL 197 01/11/2020   Lab Results  Component Value Date   HDL 64.00  01/11/2020   Lab Results  Component Value Date   LDLCALC 115 (H) 01/11/2020   Lab Results  Component Value Date   TRIG 91.0 01/11/2020   Lab Results  Component Value Date   CHOLHDL 3 01/11/2020   Lab Results  Component Value Date   HGBA1C 6.7 (  A) 08/14/2020      Assessment & Plan:   Problem List Items Addressed This Visit      Unprioritized   ERECTILE DYSFUNCTION, ORGANIC   Relevant Medications   sildenafil (VIAGRA) 100 MG tablet   Type 2 diabetes mellitus with hyperglycemia (HCC) - Primary   Relevant Orders   POC HgB A1c (Completed)    -A1c up only slightly today to 6.7% -Flu vaccine given -Recommend Prevnar 13 but he wishes to wait and defer until after his Covid booster -Continue regular exercise habits -Continue Metformin and will plan to recheck in 6 months -Reminder for yearly eye exams and urine microalbumin screen -refilled Viagra.  Meds ordered this encounter  Medications  . sildenafil (VIAGRA) 100 MG tablet    Sig: TAKE 1/2 TO 1 TABLET BY MOUTH EVERY DAY AS NEEDED    Dispense:  4 tablet    Refill:  8    Follow-up: Return in about 6 months (around 02/12/2021).    Carolann Littler, MD

## 2020-08-14 NOTE — Patient Instructions (Signed)
Get Covid booster and consider pneumonia vaccine at some point this year.

## 2020-08-15 ENCOUNTER — Telehealth: Payer: Self-pay | Admitting: Family Medicine

## 2020-08-15 DIAGNOSIS — N529 Male erectile dysfunction, unspecified: Secondary | ICD-10-CM

## 2020-08-15 NOTE — Telephone Encounter (Signed)
Pt call and want to know if he can get a paper copy of his prescription for sildenafil (VIAGRA) 100 MG tablet because it cost to much at his pharmacy or sent it to [address]

## 2020-08-16 MED ORDER — SILDENAFIL CITRATE 100 MG PO TABS: ORAL_TABLET | ORAL | 8 refills | Status: DC

## 2020-08-16 NOTE — Telephone Encounter (Signed)
OK to send to [facility]

## 2020-08-16 NOTE — Telephone Encounter (Signed)
Spoke with the pt and informed him the Rx was sent to [facility].

## 2021-02-04 ENCOUNTER — Ambulatory Visit: Payer: Medicare Other | Admitting: Cardiology

## 2021-02-11 ENCOUNTER — Other Ambulatory Visit: Payer: Self-pay | Admitting: Family Medicine

## 2021-02-12 ENCOUNTER — Other Ambulatory Visit: Payer: Self-pay

## 2021-02-12 ENCOUNTER — Ambulatory Visit (INDEPENDENT_AMBULATORY_CARE_PROVIDER_SITE_OTHER): Payer: Medicare Other | Admitting: Family Medicine

## 2021-02-12 VITALS — BP 140/80 | HR 90 | Temp 97.9°F | Wt 191.2 lb

## 2021-02-12 DIAGNOSIS — E785 Hyperlipidemia, unspecified: Secondary | ICD-10-CM | POA: Diagnosis not present

## 2021-02-12 DIAGNOSIS — R030 Elevated blood-pressure reading, without diagnosis of hypertension: Secondary | ICD-10-CM | POA: Diagnosis not present

## 2021-02-12 DIAGNOSIS — E1165 Type 2 diabetes mellitus with hyperglycemia: Secondary | ICD-10-CM

## 2021-02-12 LAB — POCT GLYCOSYLATED HEMOGLOBIN (HGB A1C): Hemoglobin A1C: 7 % — AB (ref 4.0–5.6)

## 2021-02-12 NOTE — Progress Notes (Signed)
Established Patient Office Visit  Subjective:  Patient ID: Terry Johns, male    DOB: [birth year]  Age: 65 y.o. MRN: [id number]  CC:  Chief Complaint  Patient presents with   Follow-up    diabetes    HPI Terry Johns presents for medical follow-up.  He has type 2 diabetes.  He has been able to maintain fairly good control with metformin alone.  His A1c had climbed up at 1 point to 12.2% and he is slowly inched up since then.  He went down to 6.2% to 6.7% to 7.0% today.  He states he has been less compliant with diet and exercise since last visit.  He would like to step up compliance versus adding additional medication at this time.  No polyuria or polydipsia.  He has history of mild hyperlipidemia.  Last LDL cholesterol 115.  He has declined statin use.  His 10-year risk for CAD calculates at 23.1%.  Mildly elevated blood pressure today.  No history of diagnosed hypertension.  No recent headaches or dizziness. He has history of Ehlers-Danlos syndrome.  History of mitral valve prolapse and frequent PVCs.  No recent palpitations.  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   EXTERNAL HEMORRHOIDS 05/17/2009   MITRAL VALVE PROLAPSE 05/17/2009    Past Surgical History:  Procedure Laterality Date   COLONOSCOPY  2008   FEMORAL ARTERY EXPLORATION Right 12/09/2016   Procedure: Evacuation of Hematoma Right Groin, Repair of Right Superfiical Femoral Artery;  Surgeon: Angelia Mould, MD;  Location: [facility];  Service: Vascular;  Laterality: Right;   PVC ABLATION  12/08/2016   PVC ABLATION N/A 12/08/2016   Procedure: PVC Ablation;  Surgeon: Will [name], MD;  Location: [facility];  Service: Cardiovascular;  Laterality: N/A;   SHOULDER ARTHROSCOPY WITH ROTATOR CUFF REPAIR Left 2017   "[name]"    Family  History  Problem Relation Age of Onset   Diabetes Father    Hypertension Father    Cancer Father        lung, basal cell, melanoma   Breast cancer Mother    Colon cancer Neg Hx    Esophageal cancer Neg Hx    Rectal cancer Neg Hx    Stomach cancer Neg Hx    Colon polyps Neg Hx     Social History   Socioeconomic History   Marital status: Married    Spouse name: Not on file   Number of children: Not on file   Years of education: Not on file   Highest education level: Not on file  Occupational History   Not on file  Tobacco Use   Smoking status: Former    Packs/day: 0.10    Years: 3.00    Pack years: 0.30    Types: Cigarettes   Smokeless tobacco: Never  Vaping Use   Vaping Use: Never used  Substance and Sexual Activity   Alcohol use: No   Drug use: No   Sexual activity: Yes  Other Topics Concern   Not on file  Social History Narrative   Not on file   Social Determinants of Health   Financial Resource Strain: Not on file  Food Insecurity: Not on file  Transportation Needs: Not on file  Physical Activity: Not on file  Stress: Not on file  Social  Connections: Not on file  Intimate Partner Violence: Not on file    Outpatient Medications Prior to Visit  Medication Sig Dispense Refill   acetaminophen (TYLENOL) 325 MG tablet Take 650 mg by mouth every 6 (six) hours as needed for moderate pain.     glucose blood (ACCU-CHEK GUIDE) test strip Use as instructed to check blood sugars twice weekly. 200 each 3   metFORMIN (GLUCOPHAGE) 500 MG tablet TAKE 2 TABLETS TWICE DAILY WITH MEALS. 360 tablet 1   sildenafil (VIAGRA) 100 MG tablet TAKE 1/2 TO 1 TABLET BY MOUTH EVERY DAY AS NEEDED 4 tablet 8   No facility-administered medications prior to visit.    No Known Allergies  ROS Review of Systems  Constitutional:  Negative for fatigue and unexpected weight change.  Eyes:  Negative for visual disturbance.  Respiratory:  Negative for cough, chest tightness and shortness  of breath.   Cardiovascular:  Negative for chest pain, palpitations and leg swelling.  Endocrine: Negative for polydipsia and polyuria.  Neurological:  Negative for dizziness, syncope, weakness, light-headedness and headaches.     Objective:    Physical Exam Constitutional:      Appearance: He is well-developed.  Eyes:     Pupils: Pupils are equal, round, and reactive to light.  Neck:     Thyroid: No thyromegaly.  Cardiovascular:     Rate and Rhythm: Normal rate and regular rhythm.  Pulmonary:     Effort: Pulmonary effort is normal. No respiratory distress.     Breath sounds: Normal breath sounds. No wheezing or rales.  Musculoskeletal:     Cervical back: Neck supple.  Neurological:     Mental Status: He is alert and oriented to person, place, and time.    BP 140/80 (BP Location: Left Arm, Patient Position: Sitting, Cuff Size: Normal)   Pulse 90   Temp 97.9 F (36.6 C) (Oral)   Wt 191 lb 3.2 oz (86.7 kg)   SpO2 96%   BMI 24.55 kg/m  Wt Readings from Last 3 Encounters:  02/12/21 191 lb 3.2 oz (86.7 kg)  08/14/20 190 lb 14.4 oz (86.6 kg)  06/14/20 188 lb (85.3 kg)     Health Maintenance Due  Topic Date Due   FOOT EXAM  Never done   Zoster Vaccines- Shingrix (1 of 2) Never done   OPHTHALMOLOGY EXAM  10/06/2020   COVID-19 Vaccine (4 - Booster for Pfizer series) 12/14/2020   URINE MICROALBUMIN  01/10/2021    There are no preventive care reminders to display for this patient.  Lab Results  Component Value Date   TSH 1.48 01/11/2020   Lab Results  Component Value Date   WBC 5.6 01/11/2020   HGB 14.7 01/11/2020   HCT 42.1 01/11/2020   MCV 85.8 01/11/2020   PLT 233.0 01/11/2020   Lab Results  Component Value Date   NA 138 01/11/2020   K 4.6 01/11/2020   CO2 28 01/11/2020   GLUCOSE 129 (H) 01/11/2020   BUN 16 01/11/2020   CREATININE 0.82 01/11/2020   BILITOT 0.6 01/11/2020   ALKPHOS 61 01/11/2020   AST 12 01/11/2020   ALT 10 01/11/2020   PROT 7.2  01/11/2020   ALBUMIN 4.3 01/11/2020   CALCIUM 9.4 01/11/2020   ANIONGAP 6 12/10/2016   GFR 94.41 01/11/2020   Lab Results  Component Value Date   CHOL 197 01/11/2020   Lab Results  Component Value Date   HDL 64.00 01/11/2020   Lab Results  Component Value Date  [place] 115 (H) 01/11/2020   Lab Results  Component Value Date   TRIG 91.0 01/11/2020   Lab Results  Component Value Date   CHOLHDL 3 01/11/2020   Lab Results  Component Value Date   HGBA1C 7.0 (A) 02/12/2021      Assessment & Plan:   #1 type 2 diabetes with fair control with A1c today 7.0%. -We discussed options of additional medication versus stepping up diet and exercise compliance and he prefers the latter.  We will plan 3-month follow-up.  If A1c climbing up point consider possible SGLT2 or GLP-1 medication  #2 borderline elevated blood pressure.  No prior history of hypertension. Keep sodium intake down -Exercise regularly -Monitor and if consistently greater than 140/90 be in touch otherwise reassess at follow-up reassess at follow-up  #3 history of dyslipidemia.  Calculated 10-year risk for CAD 23.1%.  We recommend he consider statin therapy but he declines.   No orders of the defined types were placed in this encounter.   Follow-up: Return in about 6 months (around 08/14/2021).    Carolann Littler, MD

## 2021-02-12 NOTE — Patient Instructions (Signed)
Step up diet and exercise  A1C was up today to 7.0%.  Suggest we check cholesterol and other labs at follow-up.

## 2021-06-04 ENCOUNTER — Other Ambulatory Visit: Payer: Self-pay | Admitting: Family Medicine

## 2021-06-12 ENCOUNTER — Ambulatory Visit: Payer: Medicare Other | Admitting: Cardiology

## 2021-07-05 ENCOUNTER — Ambulatory Visit (INDEPENDENT_AMBULATORY_CARE_PROVIDER_SITE_OTHER): Payer: Medicare Other

## 2021-07-05 DIAGNOSIS — Z0000 Encounter for general adult medical examination without abnormal findings: Secondary | ICD-10-CM

## 2021-07-05 NOTE — Progress Notes (Signed)
Subjective:   Terry Johns is a 64 y.o. male who presents for an Initial Medicare Annual Wellness Visit.  I connected with Takai Chiaramonte today by telephone and verified that I am speaking with the correct person using two identifiers. Location patient: home Location provider: work Persons participating in the virtual visit: patient, provider.   I discussed the limitations, risks, security and privacy concerns of performing an evaluation and management service by telephone and the availability of in person appointments. I also discussed with the patient that there may be a patient responsible charge related to this service. The patient expressed understanding and verbally consented to this telephonic visit.    Interactive audio and video telecommunications were attempted between this provider and patient, however failed, due to patient having technical difficulties OR patient did not have access to video capability.  We continued and completed visit with audio only.    Review of Systems           Objective:    Today's Vitals   There is no height or weight on file to calculate BMI.  Advanced Directives 07/05/2021 01/21/2017 12/30/2016 12/16/2016 12/09/2016 12/08/2016 10/17/2016  Does Patient Have a Medical Advance Directive? Yes No No No No No No  Type of Paramedic of [place];Living will - - - - - -  Copy of [place] in Chart? No - copy requested - - - - - -  Would patient like information on creating a medical advance directive? - - - - No - Patient declined No - Patient declined -    Current Medications (verified) Outpatient Encounter Medications as of 07/05/2021  Medication Sig   ACCU-CHEK GUIDE test strip USE AS INSTRUCTED TO CHECK BLOOD SUGARS TWICE WEEKLY.   acetaminophen (TYLENOL) 325 MG tablet Take 650 mg by mouth every 6 (six) hours as needed for moderate pain.   metFORMIN (GLUCOPHAGE) 500 MG tablet TAKE 2 TABLETS TWICE DAILY WITH  MEALS.   sildenafil (VIAGRA) 100 MG tablet TAKE 1/2 TO 1 TABLET BY MOUTH EVERY DAY AS NEEDED   No facility-administered encounter medications on file as of 07/05/2021.    Allergies (verified) Patient has no known allergies.   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   EXTERNAL HEMORRHOIDS 05/17/2009   MITRAL VALVE PROLAPSE 05/17/2009   Past Surgical History:  Procedure Laterality Date   COLONOSCOPY  2008   FEMORAL ARTERY EXPLORATION Right 12/09/2016   Procedure: Evacuation of Hematoma Right Groin, Repair of Right Superfiical Femoral Artery;  Surgeon: Angelia Mould, MD;  Location: [facility];  Service: Vascular;  Laterality: Right;   PVC ABLATION  12/08/2016   PVC ABLATION N/A 12/08/2016   Procedure: PVC Ablation;  Surgeon: Will [name], MD;  Location: [facility];  Service: Cardiovascular;  Laterality: N/A;   SHOULDER ARTHROSCOPY WITH ROTATOR CUFF REPAIR Left 2017   "[name]"   Family History  Problem Relation Age of Onset   Diabetes Father    Hypertension Father    Cancer Father        lung, basal cell, melanoma   Breast cancer Mother    Colon cancer Neg Hx    Esophageal cancer Neg Hx    Rectal cancer Neg Hx    Stomach cancer Neg Hx    Colon polyps  Neg Hx    Social History   Socioeconomic History   Marital status: Married    Spouse name: Not on file   Number of children: Not on file   Years of education: Not on file   Highest education level: Not on file  Occupational History   Not on file  Tobacco Use   Smoking status: Former    Packs/day: 0.10    Years: 3.00    Pack years: 0.30    Types: Cigarettes   Smokeless tobacco: Never  Vaping Use   Vaping Use: Never used  Substance and Sexual Activity   Alcohol use: No   Drug use: No   Sexual activity: Yes  Other Topics  Concern   Not on file  Social History Narrative   Not on file   Social Determinants of Health   Financial Resource Strain: Low Risk    Difficulty of Paying Living Expenses: Not hard at all  Food Insecurity: No Food Insecurity   Worried About Charity fundraiser in the Last Year: Never true   [place] in the Last Year: Never true  Transportation Needs: No Transportation Needs   Lack of Transportation (Medical): No   Lack of Transportation (Non-Medical): No  Physical Activity: Insufficiently Active   Days of Exercise per Week: 3 days   Minutes of Exercise per Session: 30 min  Stress: No Stress Concern Present   Feeling of Stress : Not at all  Social Connections: Socially Integrated   Frequency of Communication with Friends and Family: Twice a week   Frequency of Social Gatherings with Friends and Family: Twice a week   Attends Religious Services: More than 4 times per year   Active Member of [employer] or Organizations: Yes   Attends Archivist Meetings: 1 to 4 times per year   Marital Status: Married    Tobacco Counseling Counseling given: Not Answered   Clinical Intake:  Pre-visit preparation completed: Yes  Pain : No/denies pain     Diabetes: Yes CBG done?: No Did pt. bring in CBG monitor from home?: No  How often do you need to have someone help you when you read instructions, pamphlets, or other written materials from your doctor or pharmacy?: 1 - Never What is the last grade level you completed in school?: Bs  Diabetic?yes  Nutrition Risk Assessment:  Has the patient had any N/V/D within the last 2 months?  No  Does the patient have any non-healing wounds?  No  Has the patient had any unintentional weight loss or weight gain?  No   Diabetes:  Is the patient diabetic?  Yes  If diabetic, was a CBG obtained today?  No  Did the patient bring in their glucometer from home?  No  How often do you monitor your CBG's? 2 x week  Financial Strains  and Diabetes Management:  Are you having any financial strains with the device, your supplies or your medication? No .  Does the patient want to be seen by Chronic Care Management for management of their diabetes?  No  Would the patient like to be referred to a Nutritionist or for Diabetic Management?  No   Diabetic Exams:  Diabetic Eye Exam: Completed 06/2020 Diabetic Foot Exam: Overdue, Pt has been advised about the importance in completing this exam. Pt is scheduled for diabetic foot exam on next office visit .   Interpreter Needed?: No  Information entered by :: D.[email or web address]   Activities of  Daily Living No flowsheet data found.  Patient Care Team: [name], MD as PCP - General Jerline Pain, MD as PCP - Cardiology (Cardiology)  Indicate any recent Medical Services you may have received from other than [facility] providers in the past year (date may be approximate).     Assessment:   This is a routine wellness examination for Terry Johns.  Hearing/Vision screen Vision Screening - Comments:: Annual eye exams wear glasses   Dietary issues and exercise activities discussed:     Goals Addressed             This Visit's Progress    Exercise 3x per week (30 min per time)         Depression Screen PHQ 2/9 Scores 07/05/2021 07/05/2021 10/05/2019 06/08/2017  PHQ - 2 Score 0 0 0 0  PHQ- 9 Score - - 0 -    Fall Risk Fall Risk  07/05/2021 07/03/2021  Falls in the past year? 0 0  Number falls in past yr: 0 -  Injury with Fall? 0 -  Follow up Falls evaluation completed -    FALL RISK PREVENTION PERTAINING TO THE HOME:  Any stairs in or around the home? Yes  If so, are there any without handrails? No  Home free of loose throw rugs in walkways, pet beds, electrical cords, etc? Yes  Adequate lighting in your home to reduce risk of falls? Yes   ASSISTIVE DEVICES UTILIZED TO PREVENT FALLS:  Life alert? No  Use of a cane, walker or w/c? No  Grab bars in the bathroom? No   Shower chair or bench in shower? No  Elevated toilet seat or a handicapped toilet? Yes    Cognitive Function:    Normal cognitive status assessed by direct observation by this Nurse Health Advisor. No abnormalities found.      Immunizations Immunization History  Administered Date(s) Administered   Fluad Quad(high Dose 65+) 08/14/2020   Influenza Split 06/12/2011, 07/14/2012   Influenza Whole 05/28/2010   Influenza,inj,Quad PF,6+ Mos 08/04/2014, 05/18/2015, 06/08/2017, 06/16/2019   PFIZER(Purple Top)SARS-COV-2 Vaccination 11/05/2019, 11/26/2019, 08/15/2020   Tdap 06/08/2017    TDAP status: Up to date  Flu Vaccine status: Up to date  Pneumococcal vaccine status: Up to date  Covid-19 vaccine status: Completed vaccines  Qualifies for Shingles Vaccine? Yes   Zostavax completed No   Shingrix Completed?: No.    Education has been provided regarding the importance of this vaccine. Patient has been advised to call insurance company to determine out of pocket expense if they have not yet received this vaccine. Advised may also receive vaccine at local pharmacy or Health Dept. Verbalized acceptance and understanding.  Screening Tests Health Maintenance  Topic Date Due   Pneumonia Vaccine 7+ Years old (1 - PCV) Never done   FOOT EXAM  Never done   Zoster Vaccines- Shingrix (1 of 2) Never done   OPHTHALMOLOGY EXAM  10/06/2020   COVID-19 Vaccine (4 - Booster for Pfizer series) 10/10/2020   URINE MICROALBUMIN  01/10/2021   INFLUENZA VACCINE  04/01/2021   HEMOGLOBIN A1C  08/14/2021   TETANUS/TDAP  06/09/2027   COLONOSCOPY (Pts 45-21yrs Insurance coverage will need to be confirmed)  06/15/2027   Hepatitis C Screening  Completed   HPV VACCINES  Aged Out    Health Maintenance  Health Maintenance Due  Topic Date Due   Pneumonia Vaccine 59+ Years old (1 - PCV) Never done   FOOT EXAM  Never done   Zoster  Vaccines- Shingrix (1 of 2) Never done   OPHTHALMOLOGY EXAM  10/06/2020    COVID-19 Vaccine (4 - Booster for Pfizer series) 10/10/2020   URINE MICROALBUMIN  01/10/2021   INFLUENZA VACCINE  04/01/2021    Colorectal cancer screening: Type of screening: Colonoscopy. Completed 06/14/2020. Repeat every 7 years  Lung Cancer Screening: (Low Dose CT Chest recommended if Age 11-80 years, 30 pack-year currently smoking OR have quit w/in 15years.) does not qualify.   Lung Cancer Screening Referral: n/a  Additional Screening:  Hepatitis C Screening: does not qualify; Completed 06/08/2017  Vision Screening: Recommended annual ophthalmology exams for early detection of glaucoma and other disorders of the eye. Is the patient up to date with their annual eye exam?  Yes  Who is the provider or what is the name of the office in which the patient attends annual eye exams? Dr.Scott  If pt is not established with a provider, would they like to be referred to a provider to establish care? No .   Dental Screening: Recommended annual dental exams for proper oral hygiene  Community Resource Referral / Chronic Care Management: CRR required this visit?  No   CCM required this visit?  No      Plan:     I have personally reviewed and noted the following in the patient's chart:   Medical and social history Use of alcohol, tobacco or illicit drugs  Current medications and supplements including opioid prescriptions. Patient is currently taking opioid prescriptions. Information provided to patient regarding non-opioid alternatives. Patient advised to discuss non-opioid treatment plan with their provider. Functional ability and status Nutritional status Physical activity Advanced directives List of other physicians Hospitalizations, surgeries, and ER visits in previous 12 months Vitals Screenings to include cognitive, depression, and falls Referrals and appointments  In addition, I have reviewed and discussed with patient certain preventive protocols, quality metrics, and best  practice recommendations. A written personalized care plan for preventive services as well as general preventive health recommendations were provided to patient.     Randel Pigg, LPN   [id number]   Nurse Notes: none

## 2021-07-05 NOTE — Patient Instructions (Signed)
Terry Johns , Thank you for taking time to come for your Medicare Wellness Visit. I appreciate your ongoing commitment to your health goals. Please review the following plan we discussed and let me know if I can assist you in the future.   Screening recommendations/referrals: Colonoscopy: 06/14/2020 Recommended yearly ophthalmology/optometry visit for glaucoma screening and checkup Recommended yearly dental visit for hygiene and checkup  Vaccinations: Influenza vaccine: completed  Pneumococcal vaccine: will consider  Tdap vaccine: 06/08/2017 Shingles vaccine: will consider     Advanced directives: none   Conditions/risks identified: none   Next appointment: none   Preventive Care 63 Years and Older, Male Preventive care refers to lifestyle choices and visits with your health care provider that can promote health and wellness. What does preventive care include? A yearly physical exam. This is also called an annual well check. Dental exams once or twice a year. Routine eye exams. Ask your health care provider how often you should have your eyes checked. Personal lifestyle choices, including: Daily care of your teeth and gums. Regular physical activity. Eating a healthy diet. Avoiding tobacco and drug use. Limiting alcohol use. Practicing safe sex. Taking low doses of aspirin every day. Taking vitamin and mineral supplements as recommended by your health care provider. What happens during an annual well check? The services and screenings done by your health care provider during your annual well check will depend on your age, overall health, lifestyle risk factors, and family history of disease. Counseling  Your health care provider may ask you questions about your: Alcohol use. Tobacco use. Drug use. Emotional well-being. Home and relationship well-being. Sexual activity. Eating habits. History of falls. Memory and ability to understand (cognition). Work and work  Statistician. Screening  You may have the following tests or measurements: Height, weight, and BMI. Blood pressure. Lipid and cholesterol levels. These may be checked every 5 years, or more frequently if you are over 21 years old. Skin check. Lung cancer screening. You may have this screening every year starting at age 80 if you have a 30-pack-year history of smoking and currently smoke or have quit within the past 15 years. Fecal occult blood test (FOBT) of the stool. You may have this test every year starting at age 66. Flexible sigmoidoscopy or colonoscopy. You may have a sigmoidoscopy every 5 years or a colonoscopy every 10 years starting at age 91. Prostate cancer screening. Recommendations will vary depending on your family history and other risks. Hepatitis C blood test. Hepatitis B blood test. Sexually transmitted disease (STD) testing. Diabetes screening. This is done by checking your blood sugar (glucose) after you have not eaten for a while (fasting). You may have this done every 1-3 years. Abdominal aortic aneurysm (AAA) screening. You may need this if you are a current or former smoker. Osteoporosis. You may be screened starting at age 26 if you are at high risk. Talk with your health care provider about your test results, treatment options, and if necessary, the need for more tests. Vaccines  Your health care provider may recommend certain vaccines, such as: Influenza vaccine. This is recommended every year. Tetanus, diphtheria, and acellular pertussis (Tdap, Td) vaccine. You may need a Td booster every 10 years. Zoster vaccine. You may need this after age 64. Pneumococcal 13-valent conjugate (PCV13) vaccine. One dose is recommended after age 47. Pneumococcal polysaccharide (PPSV23) vaccine. One dose is recommended after age 1. Talk to your health care provider about which screenings and vaccines you need and how often  you need them. This information is not intended to replace  advice given to you by your health care provider. Make sure you discuss any questions you have with your health care provider. Document Released: 09/14/2015 Document Revised: 05/07/2016 Document Reviewed: 06/19/2015 Elsevier Interactive Patient Education  2017 [place] Prevention in the Home Falls can cause injuries. They can happen to people of all ages. There are many things you can do to make your home safe and to help prevent falls. What can I do on the outside of my home? Regularly fix the edges of walkways and driveways and fix any cracks. Remove anything that might make you trip as you walk through a door, such as a raised step or threshold. Trim any bushes or trees on the path to your home. Use bright outdoor lighting. Clear any walking paths of anything that might make someone trip, such as rocks or tools. Regularly check to see if handrails are loose or broken. Make sure that both sides of any steps have handrails. Any raised decks and porches should have guardrails on the edges. Have any leaves, snow, or ice cleared regularly. Use sand or salt on walking paths during winter. Clean up any spills in your garage right away. This includes oil or grease spills. What can I do in the bathroom? Use night lights. Install grab bars by the toilet and in the tub and shower. Do not use towel bars as grab bars. Use non-skid mats or decals in the tub or shower. If you need to sit down in the shower, use a plastic, non-slip stool. Keep the floor dry. Clean up any water that spills on the floor as soon as it happens. Remove soap buildup in the tub or shower regularly. Attach bath mats securely with double-sided non-slip rug tape. Do not have throw rugs and other things on the floor that can make you trip. What can I do in the bedroom? Use night lights. Make sure that you have a light by your bed that is easy to reach. Do not use any sheets or blankets that are too big for your bed.  They should not hang down onto the floor. Have a firm chair that has side arms. You can use this for support while you get dressed. Do not have throw rugs and other things on the floor that can make you trip. What can I do in the kitchen? Clean up any spills right away. Avoid walking on wet floors. Keep items that you use a lot in easy-to-reach places. If you need to reach something above you, use a strong step stool that has a grab bar. Keep electrical cords out of the way. Do not use floor polish or wax that makes floors slippery. If you must use wax, use non-skid floor wax. Do not have throw rugs and other things on the floor that can make you trip. What can I do with my stairs? Do not leave any items on the stairs. Make sure that there are handrails on both sides of the stairs and use them. Fix handrails that are broken or loose. Make sure that handrails are as long as the stairways. Check any carpeting to make sure that it is firmly attached to the stairs. Fix any carpet that is loose or worn. Avoid having throw rugs at the top or bottom of the stairs. If you do have throw rugs, attach them to the floor with carpet tape. Make sure that you have a light  switch at the top of the stairs and the bottom of the stairs. If you do not have them, ask someone to add them for you. What else can I do to help prevent falls? Wear shoes that: Do not have high heels. Have rubber bottoms. Are comfortable and fit you well. Are closed at the toe. Do not wear sandals. If you use a stepladder: Make sure that it is fully opened. Do not climb a closed stepladder. Make sure that both sides of the stepladder are locked into place. Ask someone to hold it for you, if possible. Clearly mark and make sure that you can see: Any grab bars or handrails. First and last steps. Where the edge of each step is. Use tools that help you move around (mobility aids) if they are needed. These  include: Canes. Walkers. Scooters. Crutches. Turn on the lights when you go into a dark area. Replace any light bulbs as soon as they burn out. Set up your furniture so you have a clear path. Avoid moving your furniture around. If any of your floors are uneven, fix them. If there are any pets around you, be aware of where they are. Review your medicines with your doctor. Some medicines can make you feel dizzy. This can increase your chance of falling. Ask your doctor what other things that you can do to help prevent falls. This information is not intended to replace advice given to you by your health care provider. Make sure you discuss any questions you have with your health care provider. Document Released: 06/14/2009 Document Revised: 01/24/2016 Document Reviewed: 09/22/2014 Elsevier Interactive Patient Education  2017 [employer].

## 2021-07-27 ENCOUNTER — Other Ambulatory Visit: Payer: Self-pay

## 2021-07-27 ENCOUNTER — Encounter (HOSPITAL_COMMUNITY): Payer: Self-pay

## 2021-07-27 ENCOUNTER — Inpatient Hospital Stay (HOSPITAL_COMMUNITY)
Admission: EM | Admit: 2021-07-27 | Discharge: 2021-07-31 | DRG: 287 | Disposition: A | Discharge status: Home / Self Care | Payer: Medicare Other | Attending: Family Medicine | Admitting: Family Medicine

## 2021-07-27 ENCOUNTER — Emergency Department (HOSPITAL_COMMUNITY): Payer: Medicare Other

## 2021-07-27 DIAGNOSIS — I493 Ventricular premature depolarization: Principal | ICD-10-CM | POA: Diagnosis present

## 2021-07-27 DIAGNOSIS — I340 Nonrheumatic mitral (valve) insufficiency: Secondary | ICD-10-CM | POA: Diagnosis present

## 2021-07-27 DIAGNOSIS — E1165 Type 2 diabetes mellitus with hyperglycemia: Secondary | ICD-10-CM | POA: Diagnosis not present

## 2021-07-27 DIAGNOSIS — E875 Hyperkalemia: Secondary | ICD-10-CM | POA: Diagnosis present

## 2021-07-27 DIAGNOSIS — R55 Syncope and collapse: Secondary | ICD-10-CM

## 2021-07-27 DIAGNOSIS — I341 Nonrheumatic mitral (valve) prolapse: Secondary | ICD-10-CM | POA: Diagnosis present

## 2021-07-27 DIAGNOSIS — Z833 Family history of diabetes mellitus: Secondary | ICD-10-CM

## 2021-07-27 DIAGNOSIS — Z79899 Other long term (current) drug therapy: Secondary | ICD-10-CM

## 2021-07-27 DIAGNOSIS — Z20822 Contact with and (suspected) exposure to covid-19: Secondary | ICD-10-CM | POA: Diagnosis present

## 2021-07-27 DIAGNOSIS — Z7984 Long term (current) use of oral hypoglycemic drugs: Secondary | ICD-10-CM

## 2021-07-27 DIAGNOSIS — Z87891 Personal history of nicotine dependence: Secondary | ICD-10-CM

## 2021-07-27 DIAGNOSIS — Q796 Ehlers-Danlos syndrome, unspecified: Secondary | ICD-10-CM

## 2021-07-27 DIAGNOSIS — E11649 Type 2 diabetes mellitus with hypoglycemia without coma: Secondary | ICD-10-CM | POA: Diagnosis not present

## 2021-07-27 DIAGNOSIS — I472 Ventricular tachycardia, unspecified: Secondary | ICD-10-CM | POA: Diagnosis present

## 2021-07-27 DIAGNOSIS — Z8249 Family history of ischemic heart disease and other diseases of the circulatory system: Secondary | ICD-10-CM

## 2021-07-27 LAB — CBC WITH DIFFERENTIAL/PLATELET
Abs Immature Granulocytes: 0.02 10*3/uL (ref 0.00–0.07)
Basophils Absolute: 0.1 10*3/uL (ref 0.0–0.1)
Basophils Relative: 1 %
Eosinophils Absolute: 0.2 10*3/uL (ref 0.0–0.5)
Eosinophils Relative: 3 %
HCT: 38.7 % — ABNORMAL LOW (ref 39.0–52.0)
Hemoglobin: 13.3 g/dL (ref 13.0–17.0)
Immature Granulocytes: 0 %
Lymphocytes Relative: 22 %
Lymphs Abs: 1.3 10*3/uL (ref 0.7–4.0)
MCH: 29.8 pg (ref 26.0–34.0)
MCHC: 34.4 g/dL (ref 30.0–36.0)
MCV: 86.8 fL (ref 80.0–100.0)
Monocytes Absolute: 0.5 10*3/uL (ref 0.1–1.0)
Monocytes Relative: 8 %
Neutro Abs: 3.8 10*3/uL (ref 1.7–7.7)
Neutrophils Relative %: 66 %
Platelets: 241 10*3/uL (ref 150–400)
RBC: 4.46 MIL/uL (ref 4.22–5.81)
RDW: 13.1 % (ref 11.5–15.5)
WBC: 5.9 10*3/uL (ref 4.0–10.5)
nRBC: 0 % (ref 0.0–0.2)

## 2021-07-27 LAB — BASIC METABOLIC PANEL
Anion gap: 6 (ref 5–15)
BUN: 16 mg/dL (ref 8–23)
CO2: 23 mmol/L (ref 22–32)
Calcium: 9.1 mg/dL (ref 8.9–10.3)
Chloride: 105 mmol/L (ref 98–111)
Creatinine, Ser: 0.95 mg/dL (ref 0.61–1.24)
GFR, Estimated: >60 mL/min (ref >60)
Glucose, Bld: 202 mg/dL — ABNORMAL HIGH (ref 70–99)
Potassium: 5.4 mmol/L — ABNORMAL HIGH (ref 3.5–5.1)
Sodium: 134 mmol/L — ABNORMAL LOW (ref 135–145)

## 2021-07-27 LAB — RESP PANEL BY RT-PCR (FLU A&B, COVID) ARPGX2
Influenza A by PCR: NEGATIVE
Influenza B by PCR: NEGATIVE
SARS Coronavirus 2 by RT PCR: NEGATIVE

## 2021-07-27 LAB — CBG MONITORING, ED: Glucose-Capillary: 220 mg/dL — ABNORMAL HIGH (ref 70–99)

## 2021-07-27 MED ORDER — ENOXAPARIN SODIUM 40 MG/0.4ML IJ SOSY
40.0000 mg | PREFILLED_SYRINGE | INTRAMUSCULAR | Status: DC
  Administered 2021-07-27 – 2021-07-30 (×4): 40 mg via SUBCUTANEOUS
  Filled 2021-07-27 (×4): qty 0.4

## 2021-07-27 MED ORDER — ACETAMINOPHEN 325 MG PO TABS: 650.0000 mg | ORAL_TABLET | Freq: Four times a day (QID) | ORAL | Status: DC | PRN

## 2021-07-27 MED ORDER — SODIUM CHLORIDE 0.9% FLUSH: 3.0000 mL | INTRAVENOUS | Status: DC | PRN

## 2021-07-27 MED ORDER — INSULIN ASPART 100 UNIT/ML IJ SOLN
0.0000 [IU] | Freq: Every day | INTRAMUSCULAR | Status: DC
  Administered 2021-07-27: 22:00:00 2 [IU] via SUBCUTANEOUS
  Administered 2021-07-28: 22:00:00 3 [IU] via SUBCUTANEOUS

## 2021-07-27 MED ORDER — SODIUM CHLORIDE 0.9% FLUSH
3.0000 mL | Freq: Two times a day (BID) | INTRAVENOUS | Status: DC
  Administered 2021-07-28 – 2021-07-30 (×2): 3 mL via INTRAVENOUS

## 2021-07-27 MED ORDER — SODIUM CHLORIDE 0.9 % IV SOLN: 250.0000 mL | INTRAVENOUS | Status: DC | PRN

## 2021-07-27 MED ORDER — INSULIN ASPART 100 UNIT/ML IJ SOLN
0.0000 [IU] | Freq: Three times a day (TID) | INTRAMUSCULAR | Status: DC
  Administered 2021-07-28 (×2): 2 [IU] via SUBCUTANEOUS
  Administered 2021-07-29: 17:00:00 7 [IU] via SUBCUTANEOUS

## 2021-07-27 MED ORDER — SODIUM CHLORIDE 0.9% FLUSH
3.0000 mL | Freq: Two times a day (BID) | INTRAVENOUS | Status: DC
  Administered 2021-07-28 – 2021-07-30 (×3): 3 mL via INTRAVENOUS

## 2021-07-27 MED ORDER — SODIUM CHLORIDE 0.9 % IV BOLUS
500.0000 mL | Freq: Once | INTRAVENOUS | Status: AC
  Administered 2021-07-27: 500 mL via INTRAVENOUS

## 2021-07-27 MED ORDER — ACETAMINOPHEN 650 MG RE SUPP: 650.0000 mg | Freq: Four times a day (QID) | RECTAL | Status: DC | PRN

## 2021-07-27 NOTE — Plan of Care (Signed)
POC initiated and progressing. 

## 2021-07-27 NOTE — ED Provider Notes (Signed)
[facility] EMERGENCY DEPARTMENT Provider Note   CSN: [id number] Arrival date & time: 07/27/21  1746     History No chief complaint on file.   Terry Johns is a 65 y.o. male.  Patient is a 65 year old male with a history of diabetes, Ehlers-Danlos syndrome with mitral valve prolapse and PVCs who presents after syncopal event.  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  He is followed by Dr. Curt Bears.  He said over the last month he has been having episodes where he feels like his heart is racing and he gets lightheaded and has to sit down and rest for about 4 to 5 minutes before it passes.  He had 3 episodes on Thanksgiving.  He said today he was outside trying to put up some Christmas lights and he felt an episode coming on and he passed out.  His wife saw him fall down kind of in slow motion.  He denies any injuries from the fall although he has a little bit of a headache.  He denies any neck or back pain.  He denies any chest pain with the episodes.  He does have shortness of breath.  He feels fatigued now but otherwise back to baseline.  No other recent illnesses.      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   PVC (premature ventricular contraction) 12/08/2016   Ehlers-Danlos syndrome 01/31/2011   Anxiety state 05/28/2010   DYSPHAGIA UNSPECIFIED 05/28/2010   MITRAL VALVE PROLAPSE 05/17/2009   EXTERNAL HEMORRHOIDS 05/17/2009   ERECTILE DYSFUNCTION, 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    Past Surgical  History:  Procedure Laterality Date   COLONOSCOPY  2008   FEMORAL ARTERY EXPLORATION Right 12/09/2016   Procedure: Evacuation of Hematoma Right Groin, Repair of Right Superfiical Femoral Artery;  Surgeon: Angelia Mould, MD;  Location: [place];  Service: Vascular;  Laterality: Right;   PVC ABLATION  12/08/2016   PVC ABLATION N/A 12/08/2016   Procedure: PVC Ablation;  Surgeon: Will [name], MD;  Location: [facility];  Service: Cardiovascular;  Laterality: N/A;   SHOULDER ARTHROSCOPY WITH ROTATOR CUFF REPAIR Left 2017   "[name]"       Family History  Problem Relation Age of Onset   Diabetes Father    Hypertension Father    Cancer Father        lung, basal cell, melanoma   Breast cancer Mother    Colon cancer Neg Hx    Esophageal cancer Neg Hx    Rectal cancer Neg Hx    Stomach cancer Neg Hx    Colon polyps Neg Hx     Social History   Tobacco Use   Smoking status: Former    Packs/day: 0.10    Years: 3.00    Pack years: 0.30    Types: Cigarettes   Smokeless tobacco: Never  Vaping Use   Vaping Use: Never used  Substance Use Topics   Alcohol use: No   Drug use: No  Home Medications Prior to Admission medications   Medication Sig Start Date End Date Taking? Authorizing Provider  ACCU-CHEK GUIDE test strip USE AS INSTRUCTED TO CHECK BLOOD SUGARS TWICE WEEKLY. 06/04/21  Yes Burchette, Alinda Sierras, MD  acetaminophen (TYLENOL) 325 MG tablet Take 650 mg by mouth every 6 (six) hours as needed for moderate pain.   Yes [provider]  fexofenadine (ALLEGRA) 180 MG tablet Take 180 mg by mouth daily as needed for allergies or rhinitis.   Yes [provider]  metFORMIN (GLUCOPHAGE) 500 MG tablet TAKE 2 TABLETS TWICE DAILY WITH MEALS. Patient taking differently: Take 1,000 mg by mouth 2 (two) times daily with a meal. 02/11/21  Yes Burchette, Alinda Sierras, MD  Multiple Vitamins-Minerals (MEGA MULTI MEN PO) Take 1 tablet by mouth daily.   Yes  [provider]  sildenafil (VIAGRA) 100 MG tablet TAKE 1/2 TO 1 TABLET BY MOUTH EVERY DAY AS NEEDED Patient taking differently: Take 50-100 mg by mouth as needed for erectile dysfunction. 08/16/20  Yes Burchette, Alinda Sierras, MD    Allergies    Patient has no known allergies.  Review of Systems   Review of Systems  Constitutional:  Positive for fatigue. Negative for chills, diaphoresis and fever.  HENT:  Negative for congestion, rhinorrhea and sneezing.   Eyes: Negative.   Respiratory:  Positive for shortness of breath. Negative for cough and chest tightness.   Cardiovascular:  Positive for palpitations. Negative for chest pain and leg swelling.  Gastrointestinal:  Negative for abdominal pain, blood in stool, diarrhea, nausea and vomiting.  Genitourinary:  Negative for difficulty urinating, flank pain, frequency and hematuria.  Musculoskeletal:  Negative for arthralgias and back pain.  Skin:  Negative for rash.  Neurological:  Positive for syncope and light-headedness. Negative for speech difficulty, weakness, numbness and headaches.   Physical Exam Updated Vital Signs BP 120/90 (BP Location: Right Arm)   Pulse 85   Temp 98.2 F (36.8 C) (Oral)   Resp 16   Ht 6\' 2"  (1.88 m)   Wt 87 kg   SpO2 100%   BMI 24.63 kg/m   Physical Exam Constitutional:      Appearance: He is well-developed.  HENT:     Head: Normocephalic and atraumatic.  Eyes:     Pupils: Pupils are equal, round, and reactive to light.  Neck:     Comments: No pain along the spine Cardiovascular:     Rate and Rhythm: Normal rate and regular rhythm.     Heart sounds: Murmur (Systolic ejection murmur loudest over the right sternal margin) heard.  Pulmonary:     Effort: Pulmonary effort is normal. No respiratory distress.     Breath sounds: Normal breath sounds. No wheezing or rales.  Chest:     Chest wall: No tenderness.  Abdominal:     General: Bowel sounds are normal.     Palpations: Abdomen is soft.      Tenderness: There is no abdominal tenderness. There is no guarding or rebound.  Musculoskeletal:        General: Normal range of motion.     Cervical back: Normal range of motion and neck supple.     Right lower leg: No edema.     Left lower leg: No edema.  Lymphadenopathy:     Cervical: No cervical adenopathy.  Skin:    General: Skin is warm and dry.     Findings: No rash.  Neurological:     General: No focal deficit present.  Mental Status: He is alert and oriented to person, place, and time.    ED Results / Procedures / Treatments   Labs (all labs ordered are listed, but only abnormal results are displayed) Labs Reviewed  BASIC METABOLIC PANEL - Abnormal; Notable for the following components:      Result Value   Sodium 134 (*)    Potassium 5.4 (*)    Glucose, Bld 202 (*)    All other components within normal limits  CBC WITH DIFFERENTIAL/PLATELET - Abnormal; Notable for the following components:   HCT 38.7 (*)    All other components within normal limits  RESP PANEL BY RT-PCR (FLU A&B, COVID) ARPGX2  HIV ANTIBODY (ROUTINE TESTING W REFLEX)  BASIC METABOLIC PANEL  MAGNESIUM  CBC  TSH    EKG EKG Interpretation  Date/Time:  Saturday July 27 2021 19:00:48 EST Ventricular Rate:  91 PR Interval:  176 QRS Duration: 92 QT Interval:  342 QTC Calculation: 420 R Axis:   6 Text Interpretation: Sinus rhythm with sinus arrhythmia with occasional Premature ventricular complexes Minimal voltage criteria for LVH, may be normal variant ( R in aVL ) T wave abnormality, consider inferior ischemia Abnormal ECG Confirmed by Malvin Johns [phone]) on 07/27/2021 7:11:51 PM  Radiology CT Head Wo Contrast  Result Date: 07/27/2021 CLINICAL DATA:  Head trauma, syncope EXAM: CT HEAD WITHOUT CONTRAST TECHNIQUE: Contiguous axial images were obtained from the base of the skull through the vertex without intravenous contrast. COMPARISON:  None. FINDINGS: Brain: No evidence of  large-territorial acute infarction. No parenchymal hemorrhage. No mass lesion. No extra-axial collection. No mass effect or midline shift. No hydrocephalus. Basilar cisterns are patent. Vascular: No hyperdense vessel. Skull: No acute fracture or focal lesion. Sinuses/Orbits: Paranasal sinuses and mastoid air cells are clear. The orbits are unremarkable. Other: None. IMPRESSION: No acute intracranial abnormality. Electronically Signed   By: Iven Finn M.D.   On: 07/27/2021 19:25    Procedures Procedures   Medications Ordered in ED Medications  sodium chloride flush (NS) 0.9 % injection 3 mL (has no administration in time range)  insulin aspart (novoLOG) injection 0-9 Units (has no administration in time range)  insulin aspart (novoLOG) injection 0-5 Units (has no administration in time range)  enoxaparin (LOVENOX) injection 40 mg (40 mg Subcutaneous Given 07/27/21 2122)  acetaminophen (TYLENOL) tablet 650 mg (has no administration in time range)    Or  acetaminophen (TYLENOL) suppository 650 mg (has no administration in time range)  sodium chloride flush (NS) 0.9 % injection 3 mL (has no administration in time range)  sodium chloride flush (NS) 0.9 % injection 3 mL (has no administration in time range)  0.9 %  sodium chloride infusion (has no administration in time range)  sodium chloride 0.9 % bolus 500 mL (500 mLs Intravenous New Bag/Given 07/27/21 2124)    ED Course  I have reviewed the triage vital signs and the nursing notes.  Pertinent labs & imaging results that were available during my care of the patient were reviewed by me and considered in my medical decision making (see chart for details).    MDM Rules/Calculators/A&P                           Patient is a 65 year old male who presents after syncopal episode.  He had some preceding episodes of palpitations and dizziness.  He has had previous episodes of similar symptoms without syncope over the last month.  It  sounds  concerning for a possible arrhythmia.  His labs are nonconcerning.  His potassium is mildly elevated but his creatinine is normal.  His EKG shows some PVCs but otherwise nonconcerning.  I spoke with Dr. Chancy Milroy who is the cardiology fellow on-call.  They will put the patient on the list to see in the morning.  He request an echocardiogram be done.  I spoke with Dr. Myna Hidalgo with the hospitalist service who will admit the patient for further treatment. Final Clinical Impression(s) / ED Diagnoses Final diagnoses:  Syncope, unspecified syncope type    Rx / DC Orders ED Discharge Orders     None        Malvin Johns, MD 07/27/21 2138

## 2021-07-27 NOTE — Progress Notes (Signed)
Patient arrived to the floor via the stretcher  and ambulate to the bathroom.

## 2021-07-27 NOTE — ED Triage Notes (Signed)
Pt was putting up christmas lights and had a passout episode.  Pt woke up and does not remember what happened.  Denies CP, has had nausea and diarrhea, have a MVP, and ablation about 2 years ago.

## 2021-07-27 NOTE — Progress Notes (Signed)
Orthostatic VS obtained, cardiac monitoring initiated, pt oriented to room and equipment, we'll continue to monitor.

## 2021-07-27 NOTE — H&P (Signed)
History and Physical    TANK DIFIORE NLG:[id number] DOB: [birth year] DOA: 07/27/2021  PCP: [name], MD   Patient coming from: Home   Chief Complaint: Loss of consciousness   HPI: Terry Johns is a pleasant 65 y.o. male with medical history significant for Ehlers-Danlos, mitral valve prolapse, and PVCs status post ablation in 2018, now presenting to the emergency department after a syncopal episode.  Patient reports recurrent episodes over the past few months involving rapid palpitations and lightheadedness.  He had 3 episodes on 07/25/2021, none the following day, and then 1 today that was associated with brief loss of consciousness.  He was preparing to hang some Christmas lights today when he had an episode of rapid palpitations and then woke up on the ground with his wife asking him if he was okay.  He returned to his usual state very quickly.  There was no tongue biting or incontinence.  Episodes have occurred under different circumstances but none while seated.  He does not feel particularly lightheaded when he stands up.  There was no chest pain associated with these.  He had been suspecting that the episodes were panic attacks.  ED Course: Upon arrival to the ED, patient is found to be afebrile, saturating well on room air, and with stable blood pressure.  EKG features sinus rhythm with sinus arrhythmia and PVCs.  Head CT is negative for acute intracranial abnormality.  Chemistry panel notable for glucose 202 and potassium 5.4.  CBC unremarkable.  Cardiology was consulted by the ED physician and hospitalists asked to admit.  Review of Systems:  All other systems reviewed and apart from HPI, are negative.  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   EXTERNAL HEMORRHOIDS 05/17/2009   MITRAL VALVE PROLAPSE 05/17/2009     Past Surgical History:  Procedure Laterality Date   COLONOSCOPY  2008   FEMORAL ARTERY EXPLORATION Right 12/09/2016   Procedure: Evacuation of Hematoma Right Groin, Repair of Right Superfiical Femoral Artery;  Surgeon: Angelia Mould, MD;  Location: [place];  Service: Vascular;  Laterality: Right;   PVC ABLATION  12/08/2016   PVC ABLATION N/A 12/08/2016   Procedure: PVC Ablation;  Surgeon: Will [name], MD;  Location: [facility];  Service: Cardiovascular;  Laterality: N/A;   SHOULDER ARTHROSCOPY WITH ROTATOR CUFF REPAIR Left 2017   "[name]"    Social History:   reports that he has quit smoking. His smoking use included cigarettes. He has a 0.30 pack-year smoking history. He has never used smokeless tobacco. He reports that he does not drink alcohol and does not use drugs.  No Known Allergies  Family History  Problem Relation Age of Onset   Diabetes Father    Hypertension Father    Cancer Father        lung, basal cell, melanoma   Breast cancer Mother    Colon cancer Neg Hx    Esophageal cancer Neg Hx    Rectal cancer Neg Hx    Stomach cancer Neg Hx    Colon polyps Neg Hx      Prior to Admission medications   Medication Sig Start Date End Date Taking? Authorizing Provider  ACCU-CHEK GUIDE test strip USE AS INSTRUCTED TO CHECK BLOOD SUGARS TWICE WEEKLY. 06/04/21   Burchette, Alinda Sierras, MD  acetaminophen (  TYLENOL) 325 MG tablet Take 650 mg by mouth every 6 (six) hours as needed for moderate pain.    [provider]  metFORMIN (GLUCOPHAGE) 500 MG tablet TAKE 2 TABLETS TWICE DAILY WITH MEALS. 02/11/21   Burchette, Alinda Sierras, MD  sildenafil (VIAGRA) 100 MG tablet TAKE 1/2 TO 1 TABLET BY MOUTH EVERY DAY AS NEEDED 08/16/20   [name], MD    Physical Exam: Vitals:   07/27/21 1758 07/27/21 1800 07/27/21 1858 07/27/21 2018  BP: (!) 142/89 128/88 132/86 120/90  Pulse: 100 74 78 85  Resp: 18 18 18 16   Temp: 98.2 F (36.8 C)      TempSrc: Oral     SpO2: 98% 98% 98% 100%  Weight:      Height:        Constitutional: NAD, calm  Eyes: PERTLA, lids and conjunctivae normal ENMT: Mucous membranes are moist. Posterior pharynx clear of any exudate or lesions.   Neck: supple, no masses  Respiratory:  no wheezing, no crackles. No accessory muscle use.  Cardiovascular: S1 & S2 heard, regular rate and rhythm. No extremity edema.   Abdomen: No distension, no tenderness, soft. Bowel sounds active.  Musculoskeletal: no clubbing / cyanosis. No joint deformity upper and lower extremities.   Skin: no significant rashes, lesions, ulcers. Warm, dry, well-perfused. Neurologic: CN 2-12 grossly intact. Moving all extremities. Alert and oriented.  Psychiatric: Pleasant. Cooperative.    Labs and Imaging on Admission: I have personally reviewed following labs and imaging studies  CBC: Recent Labs  Lab 07/27/21 1831  WBC 5.9  NEUTROABS 3.8  HGB 13.3  HCT 38.7*  MCV 86.8  PLT 297   Basic Metabolic Panel: Recent Labs  Lab 07/27/21 1831  NA 134*  K 5.4*  CL 105  CO2 23  GLUCOSE 202*  BUN 16  CREATININE 0.95  CALCIUM 9.1   GFR: Estimated Creatinine Clearance: 88.9 mL/min (by C-G formula based on SCr of 0.95 mg/dL). Liver Function Tests: No results for input(s): AST, ALT, ALKPHOS, BILITOT, PROT, ALBUMIN in the last 168 hours. No results for input(s): LIPASE, AMYLASE in the last 168 hours. No results for input(s): AMMONIA in the last 168 hours. Coagulation Profile: No results for input(s): INR, PROTIME in the last 168 hours. Cardiac Enzymes: No results for input(s): CKTOTAL, CKMB, CKMBINDEX, TROPONINI in the last 168 hours. BNP (last 3 results) No results for input(s): PROBNP in the last 8760 hours. HbA1C: No results for input(s): HGBA1C in the last 72 hours. CBG: No results for input(s): GLUCAP in the last 168 hours. Lipid Profile: No results for input(s): CHOL, HDL, LDLCALC, TRIG, CHOLHDL, LDLDIRECT in the  last 72 hours. Thyroid Function Tests: No results for input(s): TSH, T4TOTAL, FREET4, T3FREE, THYROIDAB in the last 72 hours. Anemia Panel: No results for input(s): VITAMINB12, FOLATE, FERRITIN, TIBC, IRON, RETICCTPCT in the last 72 hours. Urine analysis:    Component Value Date/Time   COLORURINE YELLOW 01/28/2011 1351   APPEARANCEUR CLEAR 01/28/2011 1351   LABSPEC 1.029 01/28/2011 1351   PHURINE 5.5 01/28/2011 1351   GLUCOSEU NEGATIVE 01/28/2011 1351   HGBUR NEGATIVE 01/28/2011 1351   BILIRUBINUR N 06/08/2017 1502   KETONESUR NEGATIVE 01/28/2011 1351   PROTEINUR N 06/08/2017 1502   PROTEINUR NEGATIVE 01/28/2011 1351   UROBILINOGEN 0.2 06/08/2017 1502   UROBILINOGEN 0.2 01/28/2011 1351   NITRITE N 06/08/2017 1502   NITRITE NEGATIVE 01/28/2011 1351   LEUKOCYTESUR Negative 06/08/2017 1502   Sepsis Labs: @LABRCNTIP (procalcitonin:4,lacticidven:4) )No results found for  this or any previous visit (from the past 240 hour(s)).   Radiological Exams on Admission: CT Head Wo Contrast  Result Date: 07/27/2021 CLINICAL DATA:  Head trauma, syncope EXAM: CT HEAD WITHOUT CONTRAST TECHNIQUE: Contiguous axial images were obtained from the base of the skull through the vertex without intravenous contrast. COMPARISON:  None. FINDINGS: Brain: No evidence of large-territorial acute infarction. No parenchymal hemorrhage. No mass lesion. No extra-axial collection. No mass effect or midline shift. No hydrocephalus. Basilar cisterns are patent. Vascular: No hyperdense vessel. Skull: No acute fracture or focal lesion. Sinuses/Orbits: Paranasal sinuses and mastoid air cells are clear. The orbits are unremarkable. Other: None. IMPRESSION: No acute intracranial abnormality. Electronically Signed   By: Iven Finn M.D.   On: 07/27/2021 19:25    EKG: Independently reviewed. Sinus rhythm, PVCs.   Assessment/Plan  1. Syncope  - Pt with hx of PVCs s/p ablation in 2018 p/w palpitations and syncope   -  Continue cardiac monitoring, correct electrolytes, check orthostatic vitals, check TSH (in light of PVCs), check echocardiogram, follow-up cardiology recommendations    2. Type II DM  - A1c was 7.0% in June 2022  - Check CBGs and use low-intensity SSI for now    3. Hyperkalemia  - Serum potassium is 5.4 on admission  - Continue cardiac monitoring, repeat chemistry panel in am    DVT prophylaxis: Lovenox  Code Status: Full  Level of Care: Level of care: Telemetry Cardiac Family Communication: None present  Disposition Plan:  Patient is from: home  Anticipated d/c is to: Home  Anticipated d/c date is: 07/28/21 Patient currently: Pending cardiac monitoring, echocardiogram, cardiology consultation  Consults called: Cardiology consulted by ED physician  Admission status: Observation     Vianne Bulls, MD [facility]  07/27/2021, 8:54 PM

## 2021-07-28 ENCOUNTER — Encounter (HOSPITAL_COMMUNITY): Payer: Self-pay | Admitting: Family Medicine

## 2021-07-28 ENCOUNTER — Observation Stay (HOSPITAL_COMMUNITY): Payer: Medicare Other

## 2021-07-28 DIAGNOSIS — Q796 Ehlers-Danlos syndrome, unspecified: Secondary | ICD-10-CM | POA: Diagnosis not present

## 2021-07-28 DIAGNOSIS — I493 Ventricular premature depolarization: Secondary | ICD-10-CM | POA: Diagnosis present

## 2021-07-28 DIAGNOSIS — E11649 Type 2 diabetes mellitus with hypoglycemia without coma: Secondary | ICD-10-CM | POA: Diagnosis not present

## 2021-07-28 DIAGNOSIS — I340 Nonrheumatic mitral (valve) insufficiency: Secondary | ICD-10-CM

## 2021-07-28 DIAGNOSIS — E875 Hyperkalemia: Secondary | ICD-10-CM | POA: Diagnosis present

## 2021-07-28 DIAGNOSIS — I472 Ventricular tachycardia, unspecified: Secondary | ICD-10-CM | POA: Diagnosis present

## 2021-07-28 DIAGNOSIS — R55 Syncope and collapse: Secondary | ICD-10-CM | POA: Diagnosis present

## 2021-07-28 DIAGNOSIS — Z7984 Long term (current) use of oral hypoglycemic drugs: Secondary | ICD-10-CM | POA: Diagnosis not present

## 2021-07-28 DIAGNOSIS — Z79899 Other long term (current) drug therapy: Secondary | ICD-10-CM | POA: Diagnosis not present

## 2021-07-28 DIAGNOSIS — Z20822 Contact with and (suspected) exposure to covid-19: Secondary | ICD-10-CM | POA: Diagnosis present

## 2021-07-28 DIAGNOSIS — E1165 Type 2 diabetes mellitus with hyperglycemia: Secondary | ICD-10-CM | POA: Diagnosis present

## 2021-07-28 DIAGNOSIS — Z87891 Personal history of nicotine dependence: Secondary | ICD-10-CM | POA: Diagnosis not present

## 2021-07-28 DIAGNOSIS — I341 Nonrheumatic mitral (valve) prolapse: Secondary | ICD-10-CM | POA: Diagnosis present

## 2021-07-28 DIAGNOSIS — Z833 Family history of diabetes mellitus: Secondary | ICD-10-CM | POA: Diagnosis not present

## 2021-07-28 DIAGNOSIS — Z8249 Family history of ischemic heart disease and other diseases of the circulatory system: Secondary | ICD-10-CM | POA: Diagnosis not present

## 2021-07-28 LAB — D-DIMER, QUANTITATIVE: D-Dimer, Quant: <0.27 ug/mL-FEU (ref 0.00–0.50)

## 2021-07-28 LAB — BASIC METABOLIC PANEL
Anion gap: 8 (ref 5–15)
BUN: 13 mg/dL (ref 8–23)
CO2: 26 mmol/L (ref 22–32)
Calcium: 9.1 mg/dL (ref 8.9–10.3)
Chloride: 105 mmol/L (ref 98–111)
Creatinine, Ser: 0.84 mg/dL (ref 0.61–1.24)
GFR, Estimated: >60 mL/min (ref >60)
Glucose, Bld: 94 mg/dL (ref 70–99)
Potassium: 3.8 mmol/L (ref 3.5–5.1)
Sodium: 139 mmol/L (ref 135–145)

## 2021-07-28 LAB — CBC
HCT: 42.8 % (ref 39.0–52.0)
Hemoglobin: 14.8 g/dL (ref 13.0–17.0)
MCH: 29.4 pg (ref 26.0–34.0)
MCHC: 34.6 g/dL (ref 30.0–36.0)
MCV: 84.9 fL (ref 80.0–100.0)
Platelets: 220 10*3/uL (ref 150–400)
RBC: 5.04 MIL/uL (ref 4.22–5.81)
RDW: 12.9 % (ref 11.5–15.5)
WBC: 6.3 10*3/uL (ref 4.0–10.5)
nRBC: 0 % (ref 0.0–0.2)

## 2021-07-28 LAB — ECHOCARDIOGRAM COMPLETE
Area-P 1/2: 3.37 cm2
Calc EF: 46.4 %
Height: 73 in
MV M vel: 5.79 m/s
MV Peak grad: 134.1 mmHg
S' Lateral: 3.8 cm
Single Plane A2C EF: 40.2 %
Single Plane A4C EF: 50.7 %
Weight: 3033.53 oz

## 2021-07-28 LAB — TROPONIN I (HIGH SENSITIVITY)
Troponin I (High Sensitivity): 9 ng/L (ref <18)
Troponin I (High Sensitivity): 6 ng/L (ref <18)

## 2021-07-28 LAB — TSH: TSH: 2.48 u[IU]/mL (ref 0.350–4.500)

## 2021-07-28 LAB — GLUCOSE, CAPILLARY
Glucose-Capillary: 162 mg/dL — ABNORMAL HIGH (ref 70–99)
Glucose-Capillary: 194 mg/dL — ABNORMAL HIGH (ref 70–99)
Glucose-Capillary: 133 mg/dL — ABNORMAL HIGH (ref 70–99)
Glucose-Capillary: 298 mg/dL — ABNORMAL HIGH (ref 70–99)

## 2021-07-28 LAB — MAGNESIUM: Magnesium: 2 mg/dL (ref 1.7–2.4)

## 2021-07-28 LAB — HIV ANTIBODY (ROUTINE TESTING W REFLEX): HIV Screen 4th Generation wRfx: NONREACTIVE

## 2021-07-28 MED ORDER — SODIUM CHLORIDE 0.9% FLUSH: 3.0000 mL | INTRAVENOUS | Status: DC | PRN

## 2021-07-28 MED ORDER — SODIUM CHLORIDE 0.9 % IV SOLN: 250.0000 mL | INTRAVENOUS | Status: DC | PRN

## 2021-07-28 MED ORDER — SODIUM CHLORIDE 0.9 % WEIGHT BASED INFUSION
1.0000 mL/kg/h | INTRAVENOUS | Status: DC
  Administered 2021-07-29: 06:00:00 1 mL/kg/h via INTRAVENOUS

## 2021-07-28 MED ORDER — SODIUM CHLORIDE 0.9 % WEIGHT BASED INFUSION
3.0000 mL/kg/h | INTRAVENOUS | Status: DC
  Administered 2021-07-29: 05:00:00 3 mL/kg/h via INTRAVENOUS

## 2021-07-28 MED ORDER — ASPIRIN 81 MG PO CHEW
81.0000 mg | CHEWABLE_TABLET | ORAL | Status: AC
  Administered 2021-07-29: 06:00:00 81 mg via ORAL
  Filled 2021-07-28: qty 1

## 2021-07-28 MED ORDER — INSULIN GLARGINE-YFGN 100 UNIT/ML ~~LOC~~ SOLN
10.0000 [IU] | Freq: Every day | SUBCUTANEOUS | Status: DC
  Administered 2021-07-28 – 2021-07-31 (×4): 10 [IU] via SUBCUTANEOUS
  Filled 2021-07-28 (×4): qty 0.1

## 2021-07-28 MED ORDER — SODIUM CHLORIDE 0.9% FLUSH
3.0000 mL | Freq: Two times a day (BID) | INTRAVENOUS | Status: DC
  Administered 2021-07-29 – 2021-07-30 (×3): 3 mL via INTRAVENOUS

## 2021-07-28 NOTE — Progress Notes (Signed)
PROGRESS NOTE    Terry Johns  QAS:[id number] DOB: [birth year] DOA: 07/27/2021 PCP: [name], MD   Brief Narrative:  HPI: Terry Johns is a pleasant 65 y.o. male with medical history significant for Ehlers-Danlos, mitral valve prolapse, and PVCs status post ablation in 2018, now presenting to the emergency department after a syncopal episode.  Patient reports recurrent episodes over the past few months involving rapid palpitations and lightheadedness.  He had 3 episodes on 07/25/2021, none the following day, and then 1 today that was associated with brief loss of consciousness.  He was preparing to hang some Christmas lights today when he had an episode of rapid palpitations and then woke up on the ground with his wife asking him if he was okay.  He returned to his usual state very quickly.  There was no tongue biting or incontinence.  Episodes have occurred under different circumstances but none while seated.  He does not feel particularly lightheaded when he stands up.  There was no chest pain associated with these.  He had been suspecting that the episodes were panic attacks.   ED Course: Upon arrival to the ED, patient is found to be afebrile, saturating well on room air, and with stable blood pressure.  EKG features sinus rhythm with sinus arrhythmia and PVCs.  Head CT is negative for acute intracranial abnormality.  Chemistry panel notable for glucose 202 and potassium 5.4.  CBC unremarkable.  Cardiology was consulted by the ED physician and hospitalists asked to admit.  Assessment & Plan:   Principal Problem:   Syncope Active Problems:   Type 2 diabetes mellitus with hyperglycemia (HCC)  1. Syncope, vasovagal?  PVCs/severe MR/?  Mitral valve prolapse: Pt with hx of PVCs s/p ablation in 2018 p/w palpitations and syncope.  EKG shows sinus rhythm with frequent PVCs.  He never had any chest pain or shortness of breath during those episodes.  Currently symptom-free.  No  dizziness.  Has known history of severe MR.  Cardiology is now on board and they are checking cardiac enzymes and have planned the patient for right and left heart cath for tomorrow.  Patient will eventually need EP consultation per cardiology note.  Will defer to cardiology for management of this.  2. Type II DM  - A1c was 7.0% in June 2022.  Currently hyperglycemic on SSI.  Will start on Lantus 10 units and repeat hemoglobin A1c.   3. Hyperkalemia: Resolved.  DVT prophylaxis: enoxaparin (LOVENOX) injection 40 mg Start: 07/27/21 2100   Code Status: Full Code  Family Communication: Patient's wife present at bedside.  Plan of care discussed with patient in length and he verbalized understanding and agreed with it.  Status is: Observation  The patient will require care spanning > 2 midnights and should be moved to inpatient because: Scheduled for heart cath tomorrow morning.  Estimated body mass index is 25.01 kg/m as calculated from the following:   Height as of this encounter: 6\' 1"  (1.854 m).   Weight as of this encounter: 86 kg.  Nutritional Assessment: Body mass index is 25.01 kg/m.Marland Kitchen Seen by dietician.  I agree with the assessment and plan as outlined below: Nutrition Status:   Skin Assessment: I have examined the patient's skin and I agree with the wound assessment as performed by the wound care RN as outlined below:    Consultants:  Cardiology  Procedures:  None  Antimicrobials:  Anti-infectives (From admission, onward)    None  Subjective: Seen and examined.  His wife at the bedside.  He states that he is feeling better.  He is just hungry.  No more dizziness, chest pain or shortness of breath.  Objective: Vitals:   07/27/21 2230 07/27/21 2255 07/28/21 0307 07/28/21 1123  BP: (!) 135/92 (!) 162/84 120/75 133/86  Pulse: 90 73 71 73  Resp: 17 (!) 21 16 18   Temp: 98.5 F (36.9 C) 98.2 F (36.8 C) 98.7 F (37.1 C) 97.9 F (36.6 C)  TempSrc: Oral  Oral Oral Oral  SpO2: 99% 95% 95% 96%  Weight:  86 kg    Height:  6\' 1"  (1.854 m)     No intake or output data in the 24 hours ending 07/28/21 1158 Filed Weights   07/27/21 1750 07/27/21 2255  Weight: 87 kg 86 kg    Examination:  General exam: Appears calm and comfortable  Respiratory system: Clear to auscultation. Respiratory effort normal. Cardiovascular system: S1 & S2 heard, RRR. No JVD,, rubs, gallops or clicks.  Positive diastolic murmur, no pedal edema. Gastrointestinal system: Abdomen is nondistended, soft and nontender. No organomegaly or masses felt. Normal bowel sounds heard. Central nervous system: Alert and oriented. No focal neurological deficits. Extremities: Symmetric 5 x 5 power. Skin: No rashes, lesions or ulcers Psychiatry: Judgement and insight appear normal. Mood & affect appropriate.    Data Reviewed: I have personally reviewed following labs and imaging studies  CBC: Recent Labs  Lab 07/27/21 1831 07/28/21 0054  WBC 5.9 6.3  NEUTROABS 3.8  --   HGB 13.3 14.8  HCT 38.7* 42.8  MCV 86.8 84.9  PLT 241 622   Basic Metabolic Panel: Recent Labs  Lab 07/27/21 1831 07/28/21 0054  NA 134* 139  K 5.4* 3.8  CL 105 105  CO2 23 26  GLUCOSE 202* 94  BUN 16 13  CREATININE 0.95 0.84  CALCIUM 9.1 9.1  MG  --  2.0   GFR: Estimated Creatinine Clearance: 97.8 mL/min (by C-G formula based on SCr of 0.84 mg/dL). Liver Function Tests: No results for input(s): AST, ALT, ALKPHOS, BILITOT, PROT, ALBUMIN in the last 168 hours. No results for input(s): LIPASE, AMYLASE in the last 168 hours. No results for input(s): AMMONIA in the last 168 hours. Coagulation Profile: No results for input(s): INR, PROTIME in the last 168 hours. Cardiac Enzymes: No results for input(s): CKTOTAL, CKMB, CKMBINDEX, TROPONINI in the last 168 hours. BNP (last 3 results) No results for input(s): PROBNP in the last 8760 hours. HbA1C: No results for input(s): HGBA1C in the last 72  hours. CBG: Recent Labs  Lab 07/27/21 2145 07/28/21 0630 07/28/21 1134  GLUCAP 220* 162* 194*   Lipid Profile: No results for input(s): CHOL, HDL, LDLCALC, TRIG, CHOLHDL, LDLDIRECT in the last 72 hours. Thyroid Function Tests: Recent Labs    07/28/21 0054  TSH 2.480   Anemia Panel: No results for input(s): VITAMINB12, FOLATE, FERRITIN, TIBC, IRON, RETICCTPCT in the last 72 hours. Sepsis Labs: No results for input(s): PROCALCITON, LATICACIDVEN in the last 168 hours.  Recent Results (from the past 240 hour(s))  Resp Panel by RT-PCR (Flu A&B, Covid) Nasopharyngeal Swab     Status: None   Collection Time: 07/27/21  8:30 PM   Specimen: Nasopharyngeal Swab; Nasopharyngeal(NP) swabs in vial transport medium  Result Value Ref Range Status   SARS Coronavirus 2 by RT PCR NEGATIVE NEGATIVE Final    Comment: (NOTE) SARS-CoV-2 target nucleic acids are NOT DETECTED.  The SARS-CoV-2 RNA is generally  detectable in upper respiratory specimens during the acute phase of infection. The lowest concentration of SARS-CoV-2 viral copies this assay can detect is 138 copies/mL. A negative result does not preclude SARS-Cov-2 infection and should not be used as the sole basis for treatment or other patient management decisions. A negative result may occur with  improper specimen collection/handling, submission of specimen other than nasopharyngeal swab, presence of viral mutation(s) within the areas targeted by this assay, and inadequate number of viral copies(<138 copies/mL). A negative result must be combined with clinical observations, patient history, and epidemiological information. The expected result is Negative.  Fact Sheet for Patients:  EntrepreneurPulse.com.au  Fact Sheet for Healthcare Providers:  IncredibleEmployment.be  This test is no t yet approved or cleared by the [place] FDA and  has been authorized for detection and/or diagnosis of  SARS-CoV-2 by FDA under an Emergency Use Authorization (EUA). This EUA will remain  in effect (meaning this test can be used) for the duration of the COVID-19 declaration under Section 564(b)(1) of the Act, 21 U.S.C.section 360bbb-3(b)(1), unless the authorization is terminated  or revoked sooner.       Influenza A by PCR NEGATIVE NEGATIVE Final   Influenza B by PCR NEGATIVE NEGATIVE Final    Comment: (NOTE) The Xpert Xpress SARS-CoV-2/FLU/RSV plus assay is intended as an aid in the diagnosis of influenza from Nasopharyngeal swab specimens and should not be used as a sole basis for treatment. Nasal washings and aspirates are unacceptable for Xpert Xpress SARS-CoV-2/FLU/RSV testing.  Fact Sheet for Patients: EntrepreneurPulse.com.au  Fact Sheet for Healthcare Providers: IncredibleEmployment.be  This test is not yet approved or cleared by the [place] FDA and has been authorized for detection and/or diagnosis of SARS-CoV-2 by FDA under an Emergency Use Authorization (EUA). This EUA will remain in effect (meaning this test can be used) for the duration of the COVID-19 declaration under Section 564(b)(1) of the Act, 21 U.S.C. section 360bbb-3(b)(1), unless the authorization is terminated or revoked.  Performed at [facility], [place] [address] [place], [place] [address]       Radiology Studies: CT Head Wo Contrast  Result Date: 07/27/2021 CLINICAL DATA:  Head trauma, syncope EXAM: CT HEAD WITHOUT CONTRAST TECHNIQUE: Contiguous axial images were obtained from the base of the skull through the vertex without intravenous contrast. COMPARISON:  None. FINDINGS: Brain: No evidence of large-territorial acute infarction. No parenchymal hemorrhage. No mass lesion. No extra-axial collection. No mass effect or midline shift. No hydrocephalus. Basilar cisterns are patent. Vascular: No hyperdense vessel. Skull: No acute fracture or focal lesion.  Sinuses/Orbits: Paranasal sinuses and mastoid air cells are clear. The orbits are unremarkable. Other: None. IMPRESSION: No acute intracranial abnormality. Electronically Signed   By: Iven Finn M.D.   On: 07/27/2021 19:25    Scheduled Meds:  enoxaparin (LOVENOX) injection  40 mg Subcutaneous Q24H   insulin aspart  0-5 Units Subcutaneous QHS   insulin aspart  0-9 Units Subcutaneous TID WC   sodium chloride flush  3 mL Intravenous Q12H   sodium chloride flush  3 mL Intravenous Q12H   Continuous Infusions:  sodium chloride       LOS: 0 days   Time spent: 32 minutes   Darliss Cheney, MD [facility]  07/28/2021, 11:58 AM  Please page via [facility] and do not message via secure chat for anything urgent. Secure chat can be used for anything non urgent.  How to contact the [facility] Attending or Consulting provider 7A -  7P or covering provider during after hours [place], for this patient?  Check the care team in [facility] and look for a) attending/consulting [facility] provider listed and b) the [facility] team listed. Page or secure chat [phone]-7P. Log into [email or web address] and use [facility] universal password to access. If you do not have the password, please contact the hospital operator. Locate the [facility] provider you are looking for under [facility] and page to a number that you can be directly reached. If you still have difficulty reaching the provider, please page the [facility] (Director on Call) for the Hospitalists listed on amion for assistance.

## 2021-07-28 NOTE — Consult Note (Signed)
Cardiology Consultation:   Patient ID: Terry Johns MRN: [id number]; DOB: 11-Mar-1956  Admit date: 07/27/2021 Date of Consult: 07/28/2021  PCP:  [name], MD   [facility] Providers Cardiologist:  Candee Furbish, MD  Electrophysiologist:  Will [name], MD       Patient Profile:   Terry Johns is a 65 y.o. male with a hx of PVC ablation, Ehlers Danlos syndrome, MVP, DM-2,  who is being seen 07/28/2021 for the evaluation of  syncope at the request of  Dr. Doristine Bosworth.  History of Present Illness:   Terry Johns with hx of Ehlers-Danlos syndrome, MVP, and hx of PVCs requiring PVC ablation in 2018. 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  Hx of DM uncontrolled then lost weight and control of DM.  Last echo  03/2020 with EF 60-65% G1DD, RV normal LA size mod dilated, mild MV regurgitation.     Pt now presents with syncope.  About 2 months ago playing pickle ball pt had hot rush, rapid, hard beating heart beats and rested it resolved. No associated symptoms.  Then 2 weeks ago in [place] had similar episode, Thanksgiving he had 3 episodes, all while standing.  He used to have panic attacks and this may begin with similar feelings but much worse.  Has no had panic attacks in a long time.  Yesterday outside felt symptoms coming on and his wife was talking to him when he came to.  No loss of bladder or bowel. He had mild nausea, no diaphoresis no chest pain.  No SOB.  He had no injuries with fall.  CT head without abnormality.    EKG:  The EKG was personally reviewed and demonstrates:  SR with PVCs and LVH.  Telemetry:  Telemetry was personally reviewed and demonstrates:  SR with NSVT 3 beats.  Labs: 2.0 Mg+, TSH 2.480,  Admit Na  134, K+ 5.4, BUN 16, Cr 0.95   now Na 139, K+3.8, Cr 0.84,  WBC 6.3 Hgb 14.8   BP lying 161/86 P72   Sitting 146/93 P 94 Standing 143/93 P 94 Standing at 3 mn  BP 134/94 P 88   Currently no complaints.  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   EXTERNAL HEMORRHOIDS 05/17/2009   MITRAL VALVE PROLAPSE 05/17/2009    Past Surgical History:  Procedure Laterality Date   COLONOSCOPY  2008   FEMORAL ARTERY EXPLORATION Right 12/09/2016   Procedure: Evacuation of Hematoma Right Groin, Repair of Right Superfiical Femoral Artery;  Surgeon: Angelia Mould, MD;  Location: [place];  Service: Vascular;  Laterality: Right;   PVC ABLATION  12/08/2016   PVC ABLATION N/A 12/08/2016   Procedure: PVC Ablation;  Surgeon: Will [name], MD;  Location: [place] [facility];  Service: Cardiovascular;  Laterality: N/A;   SHOULDER ARTHROSCOPY WITH ROTATOR CUFF REPAIR Left 2017   "[name]"     Home Medications:  Prior to Admission medications   Medication Sig Start Date End Date Taking? Authorizing Provider  ACCU-CHEK GUIDE test strip USE AS INSTRUCTED TO CHECK BLOOD SUGARS TWICE WEEKLY. 06/04/21  Yes Burchette, Alinda Sierras, MD  acetaminophen (TYLENOL) 325 MG tablet Take 650 mg by mouth every 6 (six) hours as needed for moderate pain.   Yes [provider]  fexofenadine (ALLEGRA) 180 MG tablet Take  180 mg by mouth daily as needed for allergies or rhinitis.   Yes [provider]  metFORMIN (GLUCOPHAGE) 500 MG tablet TAKE 2 TABLETS TWICE DAILY WITH MEALS. Patient taking differently: Take 1,000 mg by mouth 2 (two) times daily with a meal. 02/11/21  Yes Burchette, Alinda Sierras, MD  Multiple Vitamins-Minerals (MEGA MULTI MEN PO) Take 1 tablet by mouth daily.   Yes [provider]  sildenafil (VIAGRA) 100 MG tablet TAKE 1/2 TO 1 TABLET BY MOUTH EVERY DAY AS NEEDED Patient taking differently: Take 50-100 mg by mouth as needed for erectile dysfunction. 08/16/20  Yes Burchette, Alinda Sierras, MD    Inpatient Medications: Scheduled Meds:  enoxaparin  (LOVENOX) injection  40 mg Subcutaneous Q24H   insulin aspart  0-5 Units Subcutaneous QHS   insulin aspart  0-9 Units Subcutaneous TID WC   sodium chloride flush  3 mL Intravenous Q12H   sodium chloride flush  3 mL Intravenous Q12H   Continuous Infusions:  sodium chloride     PRN Meds: sodium chloride, acetaminophen **OR** acetaminophen, sodium chloride flush  Allergies:   No Known Allergies  Social History:   Social History   Socioeconomic History   Marital status: Married    Spouse name: Not on file   Number of children: Not on file   Years of education: Not on file   Highest education level: Not on file  Occupational History   Not on file  Tobacco Use   Smoking status: Former    Packs/day: 0.10    Years: 3.00    Pack years: 0.30    Types: Cigarettes   Smokeless tobacco: Never  Vaping Use   Vaping Use: Never used  Substance and Sexual Activity   Alcohol use: No   Drug use: No   Sexual activity: Yes  Other Topics Concern   Not on file  Social History Narrative   Not on file   Social Determinants of Health   Financial Resource Strain: Low Risk    Difficulty of Paying Living Expenses: Not hard at all  Food Insecurity: No Food Insecurity   Worried About Charity fundraiser in the Last Year: Never true   Arboriculturist in the Last Year: Never true  Transportation Needs: No Transportation Needs   Lack of Transportation (Medical): No   Lack of Transportation (Non-Medical): No  Physical Activity: Insufficiently Active   Days of Exercise per Week: 3 days   Minutes of Exercise per Session: 30 min  Stress: No Stress Concern Present   Feeling of Stress : Not at all  Social Connections: Socially Integrated   Frequency of Communication with Friends and Family: Twice a week   Frequency of Social Gatherings with Friends and Family: Twice a week   Attends Religious Services: More than 4 times per year   Active Member of [employer] or Organizations: Yes   Attends Theatre manager Meetings: 1 to 4 times per year   Marital Status: Married  Human resources officer Violence: Not At Risk   Fear of Current or Ex-Partner: No   Emotionally Abused: No   Physically Abused: No   Sexually Abused: No    Family History:    Family History  Problem Relation Age of Onset   Diabetes Father    Hypertension Father    Cancer Father        lung, basal cell, melanoma   Breast cancer Mother    Colon cancer Neg Hx    Esophageal  cancer Neg Hx    Rectal cancer Neg Hx    Stomach cancer Neg Hx    Colon polyps Neg Hx      ROS:  Please see the history of present illness.  General:no colds or fevers, no weight changes Skin:no rashes or ulcers HEENT:no blurred vision, no congestion CV:see HPI PUL:see HPI GI:no diarrhea constipation or melena, no indigestion GU:no hematuria, no dysuria MS:no joint pain, no claudication Neuro:no syncope, no lightheadedness Endo:+ diabetes, no thyroid disease  All other ROS reviewed and negative.     Physical Exam/Data:   Vitals:   07/27/21 2018 07/27/21 2230 07/27/21 2255 07/28/21 0307  BP: 120/90 (!) 135/92 (!) 162/84 120/75  Pulse: 85 90 73 71  Resp: 16 17 (!) 21 16  Temp:  98.5 F (36.9 C) 98.2 F (36.8 C) 98.7 F (37.1 C)  TempSrc:  Oral Oral Oral  SpO2: 100% 99% 95% 95%  Weight:   86 kg   Height:   6\' 1"  (1.854 m)    No intake or output data in the 24 hours ending 07/28/21 0752 Last 3 Weights 07/27/2021 07/27/2021 07/05/2021  Weight (lbs) 189 lb 9.5 oz 191 lb 12.8 oz (No Data)  Weight (kg) 86 kg 87 kg (No Data)     Body mass index is 25.01 kg/m.  General:  Well nourished, well developed, in no acute distress HEENT: normal Neck: no JVD Vascular: No carotid bruits; Distal pulses 2+ bilaterally Cardiac:  normal S1, S2; RRR; 3/6 harsh murmur no gallup rub or click Lungs:  clear to auscultation bilaterally, no wheezing, rhonchi or rales  Abd: soft, nontender, no hepatomegaly  Ext: no edema Musculoskeletal:  No  deformities, BUE and BLE strength normal and equal Skin: warm and dry  Neuro:  alert and oriented X 3 MAE follows commands no focal abnormalities noted Psych:  Normal affect   Relevant CV Studies: Echo today pending  Echo 03/15/20 MPRESSIONS     1. Left ventricular ejection fraction, by estimation, is 60 to 65%. The  left ventricle has normal function. The left ventricle has no regional  wall motion abnormalities. Left ventricular diastolic parameters are  consistent with Grade I diastolic  dysfunction (impaired relaxation).   2. Right ventricular systolic function is normal. The right ventricular  size is normal.   3. Left atrial size was moderately dilated.   4. The mitral valve is abnormal. Mild mitral valve regurgitation.   5. The aortic valve is tricuspid. Aortic valve regurgitation is trivial.  Mild aortic valve sclerosis is present, with no evidence of aortic valve  stenosis.   6. The inferior vena cava is normal in size with greater than 50%  respiratory variability, suggesting right atrial pressure of 3 mmHg.   7. Cannot exclude PFO by color doppler.   Comparison(s): Changes from prior study are noted. 3//12/2016: LVEF 60-65%,  MVP with mild MR, severe LAE.   FINDINGS   Left Ventricle: Left ventricular ejection fraction, by estimation, is 60  to 65%. The left ventricle has normal function. The left ventricle has no  regional wall motion abnormalities. The left ventricular internal cavity  size was normal in size. There is   no left ventricular hypertrophy. Left ventricular diastolic parameters  are consistent with Grade I diastolic dysfunction (impaired relaxation).   Right Ventricle: The right ventricular size is normal. No increase in  right ventricular wall thickness. Right ventricular systolic function is  normal.   Left Atrium: Left atrial size was moderately dilated.  Right Atrium: Right atrial size was normal in size.   Pericardium: There is no evidence  of pericardial effusion.   Mitral Valve: The mitral valve is abnormal. There is moderate late  systolic prolapse of both leaflets of the mitral valve. There is moderate  thickening of the mitral valve leaflet(s). Mild mitral valve  regurgitation.   Tricuspid Valve: The tricuspid valve is grossly normal. Tricuspid valve  regurgitation is trivial.   Aortic Valve: The aortic valve is tricuspid. Aortic valve regurgitation is  trivial. Mild aortic valve sclerosis is present, with no evidence of  aortic valve stenosis.   Pulmonic Valve: The pulmonic valve was grossly normal. Pulmonic valve  regurgitation is not visualized.   Aorta: The aortic root and ascending aorta are structurally normal, with  no evidence of dilitation.  Echocardiogram 11/03/16: - Left ventricle: The cavity size was mildly dilated. There was   mild concentric hypertrophy. Systolic function was normal. The   estimated ejection fraction was in the range of 60% to 65%. Wall   motion was normal; there were no regional wall motion   abnormalities. Features are consistent with a pseudonormal left   ventricular filling pattern, with concomitant abnormal relaxation   and increased filling pressure (grade 2 diastolic dysfunction).   Doppler parameters are consistent with indeterminate ventricular   filling pressure. - Aortic valve: Transvalvular velocity was within the normal range.   There was no stenosis. There was no regurgitation. - Mitral valve: Transvalvular velocity was within the normal range.   There was no evidence for stenosis. There was mild regurgitation. - Left atrium: The atrium was severely dilated. - Right ventricle: The cavity size was normal. Wall thickness was   normal. Systolic function was normal. - Atrial septum: No defect or patent foramen ovale was identified   by color flow Doppler. - Tricuspid valve: There was no regurgitation. - Pulmonary arteries: Systolic pressure was within the normal    range. PA peak pressure: 14 mm Hg (S).   48 hr holter: 51,000 total PVCs out of 258,000 beats recorded (20%) Rare ventricular run, 3 beats, 4 beats. Sinus rhythm underlying. No atrial fibrillation.     Laboratory Data:  High Sensitivity Troponin:  No results for input(s): TROPONINIHS in the last 720 hours.   Chemistry Recent Labs  Lab 07/27/21 1831 07/28/21 0054  NA 134* 139  K 5.4* 3.8  CL 105 105  CO2 23 26  GLUCOSE 202* 94  BUN 16 13  CREATININE 0.95 0.84  CALCIUM 9.1 9.1  MG  --  2.0  GFRNONAA >60 >60  ANIONGAP 6 8    No results for input(s): PROT, ALBUMIN, AST, ALT, ALKPHOS, BILITOT in the last 168 hours. Lipids No results for input(s): CHOL, TRIG, HDL, LABVLDL, LDLCALC, CHOLHDL in the last 168 hours.  Hematology Recent Labs  Lab 07/27/21 1831 07/28/21 0054  WBC 5.9 6.3  RBC 4.46 5.04  HGB 13.3 14.8  HCT 38.7* 42.8  MCV 86.8 84.9  MCH 29.8 29.4  MCHC 34.4 34.6  RDW 13.1 12.9  PLT 241 220   Thyroid  Recent Labs  Lab 07/28/21 0054  TSH 2.480    BNPNo results for input(s): BNP, PROBNP in the last 168 hours.  DDimer No results for input(s): DDIMER in the last 168 hours.   Radiology/Studies:  CT Head Wo Contrast  Result Date: 07/27/2021 CLINICAL DATA:  Head trauma, syncope EXAM: CT HEAD WITHOUT CONTRAST TECHNIQUE: Contiguous axial images were obtained from the base of the skull  through the vertex without intravenous contrast. COMPARISON:  None. FINDINGS: Brain: No evidence of large-territorial acute infarction. No parenchymal hemorrhage. No mass lesion. No extra-axial collection. No mass effect or midline shift. No hydrocephalus. Basilar cisterns are patent. Vascular: No hyperdense vessel. Skull: No acute fracture or focal lesion. Sinuses/Orbits: Paranasal sinuses and mastoid air cells are clear. The orbits are unremarkable. Other: None. IMPRESSION: No acute intracranial abnormality. Electronically Signed   By: Iven Finn M.D.   On: 07/27/2021 19:25      Assessment and Plan:   Syncope with hot rush and rapid strong heart beats.  Several similar episodes without syncope, all while standing.   Bi chest pain, no SOB, mild nausea after syncope. Freq PVCs with short bursts of NSVT 3 beats - he is occ aware of these but not like the rapid heart beat with syncope and near syncope episodes.  Hx of PVC ablation in 2018.  (Complicated with Rt groin pseudoaneurysm with surgery to repair) MR, mild in 2021 Ehlers-Danlos syndrome.     Risk Assessment/Risk Scores:                For questions or updates, please contact [place] Please consult [email or web address] for contact info under    Signed, Cecilie Kicks, NP  07/28/2021 7:52 AM

## 2021-07-28 NOTE — Progress Notes (Signed)
  Echocardiogram 2D Echocardiogram has been performed.  Johny Chess 07/28/2021, 1:39 PM

## 2021-07-29 ENCOUNTER — Inpatient Hospital Stay (HOSPITAL_COMMUNITY): Payer: Medicare Other

## 2021-07-29 ENCOUNTER — Encounter (HOSPITAL_COMMUNITY): Payer: Self-pay | Admitting: Family Medicine

## 2021-07-29 ENCOUNTER — Encounter (HOSPITAL_COMMUNITY)
Admission: EM | Disposition: A | Discharge status: Home / Self Care | Payer: Self-pay | Source: Home / Self Care | Attending: Family Medicine

## 2021-07-29 ENCOUNTER — Inpatient Hospital Stay (HOSPITAL_COMMUNITY): Payer: Medicare Other | Admitting: Anesthesiology

## 2021-07-29 DIAGNOSIS — I340 Nonrheumatic mitral (valve) insufficiency: Secondary | ICD-10-CM | POA: Diagnosis not present

## 2021-07-29 DIAGNOSIS — R55 Syncope and collapse: Secondary | ICD-10-CM | POA: Diagnosis not present

## 2021-07-29 DIAGNOSIS — E1165 Type 2 diabetes mellitus with hyperglycemia: Secondary | ICD-10-CM | POA: Diagnosis not present

## 2021-07-29 DIAGNOSIS — I341 Nonrheumatic mitral (valve) prolapse: Secondary | ICD-10-CM

## 2021-07-29 HISTORY — PX: RIGHT/LEFT HEART CATH AND CORONARY ANGIOGRAPHY: CATH118266

## 2021-07-29 HISTORY — PX: TEE WITHOUT CARDIOVERSION: SHX5443

## 2021-07-29 LAB — CBC
HCT: 39.7 % (ref 39.0–52.0)
Hemoglobin: 13.7 g/dL (ref 13.0–17.0)
MCH: 29.5 pg (ref 26.0–34.0)
MCHC: 34.5 g/dL (ref 30.0–36.0)
MCV: 85.4 fL (ref 80.0–100.0)
Platelets: 229 10*3/uL (ref 150–400)
RBC: 4.65 MIL/uL (ref 4.22–5.81)
RDW: 13 % (ref 11.5–15.5)
WBC: 5.6 10*3/uL (ref 4.0–10.5)
nRBC: 0 % (ref 0.0–0.2)

## 2021-07-29 LAB — POCT I-STAT 7, (LYTES, BLD GAS, ICA,H+H)
Acid-base deficit: 1 mmol/L (ref 0.0–2.0)
Bicarbonate: 24.1 mmol/L (ref 20.0–28.0)
Calcium, Ion: 1.13 mmol/L — ABNORMAL LOW (ref 1.15–1.40)
HCT: 36 % — ABNORMAL LOW (ref 39.0–52.0)
Hemoglobin: 12.2 g/dL — ABNORMAL LOW (ref 13.0–17.0)
O2 Saturation: 97 %
Potassium: 3.8 mmol/L (ref 3.5–5.1)
Sodium: 141 mmol/L (ref 135–145)
TCO2: 25 mmol/L (ref 22–32)
pCO2 arterial: 39.8 mmHg (ref 32.0–48.0)
pH, Arterial: 7.39 (ref 7.350–7.450)
pO2, Arterial: 93 mmHg (ref 83.0–108.0)

## 2021-07-29 LAB — GLUCOSE, CAPILLARY
Glucose-Capillary: 144 mg/dL — ABNORMAL HIGH (ref 70–99)
Glucose-Capillary: 307 mg/dL — ABNORMAL HIGH (ref 70–99)
Glucose-Capillary: 183 mg/dL — ABNORMAL HIGH (ref 70–99)

## 2021-07-29 LAB — POCT I-STAT EG7
Acid-Base Excess: 0 mmol/L (ref 0.0–2.0)
Bicarbonate: 25.9 mmol/L (ref 20.0–28.0)
Calcium, Ion: 1.23 mmol/L (ref 1.15–1.40)
HCT: 38 % — ABNORMAL LOW (ref 39.0–52.0)
Hemoglobin: 12.9 g/dL — ABNORMAL LOW (ref 13.0–17.0)
O2 Saturation: 72 %
Potassium: 4.1 mmol/L (ref 3.5–5.1)
Sodium: 140 mmol/L (ref 135–145)
TCO2: 27 mmol/L (ref 22–32)
pCO2, Ven: 45.6 mmHg (ref 44.0–60.0)
pH, Ven: 7.363 (ref 7.250–7.430)
pO2, Ven: 39 mmHg (ref 32.0–45.0)

## 2021-07-29 LAB — PROTIME-INR
INR: 1 (ref 0.8–1.2)
Prothrombin Time: 13.4 seconds (ref 11.4–15.2)

## 2021-07-29 LAB — BASIC METABOLIC PANEL
Anion gap: 6 (ref 5–15)
BUN: 13 mg/dL (ref 8–23)
CO2: 28 mmol/L (ref 22–32)
Calcium: 9.1 mg/dL (ref 8.9–10.3)
Chloride: 105 mmol/L (ref 98–111)
Creatinine, Ser: 0.84 mg/dL (ref 0.61–1.24)
GFR, Estimated: >60 mL/min (ref >60)
Glucose, Bld: 57 mg/dL — ABNORMAL LOW (ref 70–99)
Potassium: 3.9 mmol/L (ref 3.5–5.1)
Sodium: 139 mmol/L (ref 135–145)

## 2021-07-29 LAB — ECHO TEE
MV M vel: 5.68 m/s
MV Peak grad: 129 mmHg
Radius: 1 cm

## 2021-07-29 SURGERY — RIGHT/LEFT HEART CATH AND CORONARY ANGIOGRAPHY
Anesthesia: LOCAL

## 2021-07-29 SURGERY — ECHOCARDIOGRAM, TRANSESOPHAGEAL
Anesthesia: Monitor Anesthesia Care

## 2021-07-29 MED ORDER — VERAPAMIL HCL 2.5 MG/ML IV SOLN
INTRAVENOUS | Status: AC
  Filled 2021-07-29: qty 2

## 2021-07-29 MED ORDER — SODIUM CHLORIDE 0.9 % IV SOLN: INTRAVENOUS | Status: DC

## 2021-07-29 MED ORDER — HEPARIN SODIUM (PORCINE) 1000 UNIT/ML IJ SOLN
INTRAMUSCULAR | Status: DC | PRN
  Administered 2021-07-29: 4000 [IU] via INTRAVENOUS

## 2021-07-29 MED ORDER — LABETALOL HCL 5 MG/ML IV SOLN: 10.0000 mg | INTRAVENOUS | Status: AC | PRN

## 2021-07-29 MED ORDER — PHENYLEPHRINE 40 MCG/ML (10ML) SYRINGE FOR IV PUSH (FOR BLOOD PRESSURE SUPPORT)
PREFILLED_SYRINGE | INTRAVENOUS | Status: DC | PRN
  Administered 2021-07-29: 40 ug via INTRAVENOUS

## 2021-07-29 MED ORDER — SODIUM CHLORIDE 0.9 % IV SOLN: INTRAVENOUS | Status: AC

## 2021-07-29 MED ORDER — HEPARIN (PORCINE) IN NACL 1000-0.9 UT/500ML-% IV SOLN
INTRAVENOUS | Status: DC | PRN
  Administered 2021-07-29 (×2): 500 mL

## 2021-07-29 MED ORDER — MIDAZOLAM HCL 2 MG/2ML IJ SOLN
INTRAMUSCULAR | Status: DC | PRN
  Administered 2021-07-29: 1 mg via INTRAVENOUS

## 2021-07-29 MED ORDER — SODIUM CHLORIDE 0.9 % IV SOLN: 250.0000 mL | INTRAVENOUS | Status: DC | PRN

## 2021-07-29 MED ORDER — FENTANYL CITRATE (PF) 100 MCG/2ML IJ SOLN
INTRAMUSCULAR | Status: DC | PRN
  Administered 2021-07-29: 25 ug via INTRAVENOUS

## 2021-07-29 MED ORDER — VERAPAMIL HCL 2.5 MG/ML IV SOLN
INTRAVENOUS | Status: DC | PRN
  Administered 2021-07-29: 14:00:00 10 mL via INTRA_ARTERIAL

## 2021-07-29 MED ORDER — PROPOFOL 10 MG/ML IV BOLUS
INTRAVENOUS | Status: DC | PRN
  Administered 2021-07-29: 30 mg via INTRAVENOUS
  Administered 2021-07-29: 10 mg via INTRAVENOUS

## 2021-07-29 MED ORDER — LIDOCAINE HCL (PF) 1 % IJ SOLN
INTRAMUSCULAR | Status: AC
  Filled 2021-07-29: qty 30

## 2021-07-29 MED ORDER — IOHEXOL 350 MG/ML SOLN
INTRAVENOUS | Status: DC | PRN
  Administered 2021-07-29: 14:00:00 50 mL

## 2021-07-29 MED ORDER — INSULIN ASPART 100 UNIT/ML IJ SOLN
0.0000 [IU] | Freq: Three times a day (TID) | INTRAMUSCULAR | Status: DC
  Administered 2021-07-30: 2 [IU] via SUBCUTANEOUS
  Administered 2021-07-30 (×2): 1 [IU] via SUBCUTANEOUS

## 2021-07-29 MED ORDER — SODIUM CHLORIDE 0.9 % IV SOLN
INTRAVENOUS | Status: AC | PRN
  Administered 2021-07-29: 500 mL via INTRAVENOUS

## 2021-07-29 MED ORDER — HEPARIN SODIUM (PORCINE) 1000 UNIT/ML IJ SOLN
INTRAMUSCULAR | Status: AC
  Filled 2021-07-29: qty 10

## 2021-07-29 MED ORDER — BUTAMBEN-TETRACAINE-BENZOCAINE 2-2-14 % EX AERO
INHALATION_SPRAY | CUTANEOUS | Status: DC | PRN
  Administered 2021-07-29: 12:00:00 2 via TOPICAL

## 2021-07-29 MED ORDER — SODIUM CHLORIDE 0.9 % IV SOLN
INTRAVENOUS | Status: AC | PRN
  Administered 2021-07-29: 50 mL/h via INTRAVENOUS

## 2021-07-29 MED ORDER — MIDAZOLAM HCL 2 MG/2ML IJ SOLN
INTRAMUSCULAR | Status: AC
  Filled 2021-07-29: qty 2

## 2021-07-29 MED ORDER — SODIUM CHLORIDE 0.9% FLUSH
3.0000 mL | Freq: Two times a day (BID) | INTRAVENOUS | Status: DC
  Administered 2021-07-30 (×2): 3 mL via INTRAVENOUS

## 2021-07-29 MED ORDER — SODIUM CHLORIDE 0.9% FLUSH: 3.0000 mL | INTRAVENOUS | Status: DC | PRN

## 2021-07-29 MED ORDER — INSULIN ASPART 100 UNIT/ML IJ SOLN: 0.0000 [IU] | Freq: Every day | INTRAMUSCULAR | Status: DC

## 2021-07-29 MED ORDER — PROPOFOL 500 MG/50ML IV EMUL
INTRAVENOUS | Status: DC | PRN
  Administered 2021-07-29: 120 ug/kg/min via INTRAVENOUS

## 2021-07-29 MED ORDER — FENTANYL CITRATE (PF) 100 MCG/2ML IJ SOLN
INTRAMUSCULAR | Status: AC
  Filled 2021-07-29: qty 2

## 2021-07-29 MED ORDER — LIDOCAINE HCL (PF) 1 % IJ SOLN
INTRAMUSCULAR | Status: DC | PRN
  Administered 2021-07-29: 2 mL
  Administered 2021-07-29: 1 mL

## 2021-07-29 MED ORDER — HYDRALAZINE HCL 20 MG/ML IJ SOLN: 10.0000 mg | INTRAMUSCULAR | Status: AC | PRN

## 2021-07-29 MED ORDER — HEPARIN (PORCINE) IN NACL 1000-0.9 UT/500ML-% IV SOLN
INTRAVENOUS | Status: AC
  Filled 2021-07-29: qty 1000

## 2021-07-29 SURGICAL SUPPLY — 11 items

## 2021-07-29 NOTE — Anesthesia Procedure Notes (Signed)
Procedure Name: MAC Date/Time: 07/29/2021 11:59 AM Performed by: Jenne Campus, CRNA Pre-anesthesia Checklist: Patient identified, Emergency Drugs available, Suction available and Patient being monitored Oxygen Delivery Method: Nasal cannula

## 2021-07-29 NOTE — Progress Notes (Signed)
Patient arrived back to 4E. Vital signs stable and patient hooked back up to tele. CCMD notified. TR band assessed- 10cc. Patient diet orders in and assisted patient order meal. Wife called to notify patient back to floor. All questions answered and patient oriented to unit. Call bed at reach.  [place] N Shewanda Sharpe

## 2021-07-29 NOTE — Progress Notes (Signed)
PROGRESS NOTE    Terry Johns  UYQ:[id number] DOB: [birth year] DOA: 07/27/2021 PCP: [name], MD   Brief Narrative:  Terry Johns is a pleasant 65 y.o. male with medical history significant for Ehlers-Danlos, mitral valve prolapse, and PVCs status post ablation in 2018, presented with syncopal episode.  Patient reports recurrent episodes over the past few months involving rapid palpitations and lightheadedness.  He had 3 episodes on 07/25/2021, none the following day, and then 1 on the day of admission that was associated with brief loss of consciousness.  He was preparing to hang some Christmas lights when he had an episode of rapid palpitations and then woke up on the ground. He returned to his usual state very quickly.  There was no tongue biting or incontinence.  Episodes have occurred under different circumstances but none while seated.  He does not feel particularly lightheaded when he stands up.  There was no chest pain associated with these.     Upon arrival to the ED, patient was hemodynamically stable.  EKG features sinus rhythm with sinus arrhythmia and PVCs.  Head CT is negative for acute intracranial abnormality.  Chemistry panel notable for glucose 202 and potassium 5.4.  CBC unremarkable.  Cardiology was consulted by the ED physician and hospitalists asked to admit.  Assessment & Plan:   Principal Problem:   Syncope Active Problems:   Type 2 diabetes mellitus with hyperglycemia (HCC)  1. Syncope, vasovagal?  PVCs/severe MR/?  Mitral valve prolapse: Pt with hx of PVCs s/p ablation in 2018 p/w palpitations and syncope.  EKG shows sinus rhythm with frequent PVCs.  He never had any chest pain or shortness of breath during those episodes.  Currently symptom-free.  No dizziness.  Has known history of severe MR.  Cardiology is now on board.  Troponin negative.  He is scheduled for right and left heart cath as well as TEE today.    2. Type II DM  - A1c was 7.0% in June  2022.  Repeat hemoglobin A1c pending.  Due to hypoglycemic and started on Lantus 10 units yesterday, blood sugar slightly better.  Continue this and SSI.   3. Hyperkalemia: Resolved.  DVT prophylaxis: enoxaparin (LOVENOX) injection 40 mg Start: 07/27/21 2100   Code Status: Full Code  Family Communication: none present at bedside.  Plan of care discussed with patient in length and he verbalized understanding and agreed with it.  Status is: Inpatient  Remains inpatient appropriate because: Needing further cardiac work-up with TEE and right and left heart cath.   Estimated body mass index is 24.64 kg/m as calculated from the following:   Height as of this encounter: 6\' 1"  (1.854 m).   Weight as of this encounter: 84.7 kg.  Nutritional Assessment: Body mass index is 24.64 kg/m.Marland Kitchen Seen by dietician.  I agree with the assessment and plan as outlined below: Nutrition Status:   Skin Assessment: I have examined the patient's skin and I agree with the wound assessment as performed by the wound care RN as outlined below:    Consultants:  Cardiology  Procedures:  None  Antimicrobials:  Anti-infectives (From admission, onward)    None          Subjective:  Patient seen and examined.  He has no complaints.  Objective: Vitals:   07/28/21 2314 07/29/21 0441 07/29/21 0905 07/29/21 1119  BP: 112/75 128/73 (!) 146/81 (!) 142/85  Pulse: 70 75 82 72  Resp: 19 18 20 16   Temp: 98.3  F (36.8 C) 98.2 F (36.8 C) 98 F (36.7 C) 98.2 F (36.8 C)  TempSrc: Oral Oral Oral Temporal  SpO2:   95% 99%  Weight:  84.7 kg    Height:  6\' 1"  (1.854 m)      Intake/Output Summary (Last 24 hours) at 07/29/2021 1149 Last data filed at 07/29/2021 0626 Gross per 24 hour  Intake 812.53 ml  Output --  Net 812.53 ml   Filed Weights   07/27/21 1750 07/27/21 2255 07/29/21 0441  Weight: 87 kg 86 kg 84.7 kg    Examination:  General exam: Appears calm and comfortable  Respiratory system:  Clear to auscultation. Respiratory effort normal. Cardiovascular system: S1 & S2 heard, RRR. No JVD, rubs, gallops or clicks.  Positive diastolic murmur.  No pedal edema. Gastrointestinal system: Abdomen is nondistended, soft and nontender. No organomegaly or masses felt. Normal bowel sounds heard. Central nervous system: Alert and oriented. No focal neurological deficits. Extremities: Symmetric 5 x 5 power. Skin: No rashes, lesions or ulcers.  Psychiatry: Judgement and insight appear normal. Mood & affect appropriate.    Data Reviewed: I have personally reviewed following labs and imaging studies  CBC: Recent Labs  Lab 07/27/21 1831 07/28/21 0054 07/29/21 0047  WBC 5.9 6.3 5.6  NEUTROABS 3.8  --   --   HGB 13.3 14.8 13.7  HCT 38.7* 42.8 39.7  MCV 86.8 84.9 85.4  PLT 241 220 628    Basic Metabolic Panel: Recent Labs  Lab 07/27/21 1831 07/28/21 0054 07/29/21 0047  NA 134* 139 139  K 5.4* 3.8 3.9  CL 105 105 105  CO2 23 26 28   GLUCOSE 202* 94 57*  BUN 16 13 13   CREATININE 0.95 0.84 0.84  CALCIUM 9.1 9.1 9.1  MG  --  2.0  --     GFR: Estimated Creatinine Clearance: 97.8 mL/min (by C-G formula based on SCr of 0.84 mg/dL). Liver Function Tests: No results for input(s): AST, ALT, ALKPHOS, BILITOT, PROT, ALBUMIN in the last 168 hours. No results for input(s): LIPASE, AMYLASE in the last 168 hours. No results for input(s): AMMONIA in the last 168 hours. Coagulation Profile: Recent Labs  Lab 07/29/21 0047  INR 1.0   Cardiac Enzymes: No results for input(s): CKTOTAL, CKMB, CKMBINDEX, TROPONINI in the last 168 hours. BNP (last 3 results) No results for input(s): PROBNP in the last 8760 hours. HbA1C: No results for input(s): HGBA1C in the last 72 hours. CBG: Recent Labs  Lab 07/28/21 0630 07/28/21 1134 07/28/21 1709 07/28/21 2128 07/29/21 0608  GLUCAP 162* 194* 133* 298* 144*    Lipid Profile: No results for input(s): CHOL, HDL, LDLCALC, TRIG, CHOLHDL,  LDLDIRECT in the last 72 hours. Thyroid Function Tests: Recent Labs    07/28/21 0054  TSH 2.480    Anemia Panel: No results for input(s): VITAMINB12, FOLATE, FERRITIN, TIBC, IRON, RETICCTPCT in the last 72 hours. Sepsis Labs: No results for input(s): PROCALCITON, LATICACIDVEN in the last 168 hours.  Recent Results (from the past 240 hour(s))  Resp Panel by RT-PCR (Flu A&B, Covid) Nasopharyngeal Swab     Status: None   Collection Time: 07/27/21  8:30 PM   Specimen: Nasopharyngeal Swab; Nasopharyngeal(NP) swabs in vial transport medium  Result Value Ref Range Status   SARS Coronavirus 2 by RT PCR NEGATIVE NEGATIVE Final    Comment: (NOTE) SARS-CoV-2 target nucleic acids are NOT DETECTED.  The SARS-CoV-2 RNA is generally detectable in upper respiratory specimens during the acute phase of infection.  The lowest concentration of SARS-CoV-2 viral copies this assay can detect is 138 copies/mL. A negative result does not preclude SARS-Cov-2 infection and should not be used as the sole basis for treatment or other patient management decisions. A negative result may occur with  improper specimen collection/handling, submission of specimen other than nasopharyngeal swab, presence of viral mutation(s) within the areas targeted by this assay, and inadequate number of viral copies(<138 copies/mL). A negative result must be combined with clinical observations, patient history, and epidemiological information. The expected result is Negative.  Fact Sheet for Patients:  EntrepreneurPulse.com.au  Fact Sheet for Healthcare Providers:  IncredibleEmployment.be  This test is no t yet approved or cleared by the [place] FDA and  has been authorized for detection and/or diagnosis of SARS-CoV-2 by FDA under an Emergency Use Authorization (EUA). This EUA will remain  in effect (meaning this test can be used) for the duration of the COVID-19 declaration under  Section 564(b)(1) of the Act, 21 U.S.C.section 360bbb-3(b)(1), unless the authorization is terminated  or revoked sooner.       Influenza A by PCR NEGATIVE NEGATIVE Final   Influenza B by PCR NEGATIVE NEGATIVE Final    Comment: (NOTE) The Xpert Xpress SARS-CoV-2/FLU/RSV plus assay is intended as an aid in the diagnosis of influenza from Nasopharyngeal swab specimens and should not be used as a sole basis for treatment. Nasal washings and aspirates are unacceptable for Xpert Xpress SARS-CoV-2/FLU/RSV testing.  Fact Sheet for Patients: EntrepreneurPulse.com.au  Fact Sheet for Healthcare Providers: IncredibleEmployment.be  This test is not yet approved or cleared by the [place] FDA and has been authorized for detection and/or diagnosis of SARS-CoV-2 by FDA under an Emergency Use Authorization (EUA). This EUA will remain in effect (meaning this test can be used) for the duration of the COVID-19 declaration under Section 564(b)(1) of the Act, 21 U.S.C. section 360bbb-3(b)(1), unless the authorization is terminated or revoked.  Performed at [facility], [place] [address], [place], [place] [address]        Radiology Studies: CT Head Wo Contrast  Result Date: 07/27/2021 CLINICAL DATA:  Head trauma, syncope EXAM: CT HEAD WITHOUT CONTRAST TECHNIQUE: Contiguous axial images were obtained from the base of the skull through the vertex without intravenous contrast. COMPARISON:  None. FINDINGS: Brain: No evidence of large-territorial acute infarction. No parenchymal hemorrhage. No mass lesion. No extra-axial collection. No mass effect or midline shift. No hydrocephalus. Basilar cisterns are patent. Vascular: No hyperdense vessel. Skull: No acute fracture or focal lesion. Sinuses/Orbits: Paranasal sinuses and mastoid air cells are clear. The orbits are unremarkable. Other: None. IMPRESSION: No acute intracranial abnormality. Electronically Signed    By: Iven Finn M.D.   On: 07/27/2021 19:25   ECHOCARDIOGRAM COMPLETE  Result Date: 07/28/2021    ECHOCARDIOGRAM REPORT   Patient Name:   PAYDEN DOCTER Date of Exam: 07/28/2021 Medical Rec #:  [id number]       Height:       73.0 in Accession #:    [id number]      Weight:       189.6 lb Date of Birth:  1956/02/15       BSA:          2.103 m Patient Age:    85 years        BP:           133/86 mmHg Patient Gender: M  HR:           73 bpm. Exam Location:  Inpatient Procedure: 2D Echo Indications:    syncope  History:        Patient has prior history of Echocardiogram examinations, most                 recent 03/15/2020. Ehlers-Danlos syndrome, Mitral Valve Prolapse,                 Arrythmias:PVC; Risk Factors:Diabetes.  Sonographer:    Johny Chess RDCS Referring Phys: [id number] TIMOTHY S OPYD  Sonographer Comments: Image acquisition challenging due to respiratory motion. IMPRESSIONS  1. Left ventricular ejection fraction, by estimation, is 50 to 55%. The left ventricle has low normal function. The left ventricle has no regional wall motion abnormalities. There is mild left ventricular hypertrophy. Left ventricular diastolic parameters are consistent with Grade II diastolic dysfunction (pseudonormalization). Elevated left atrial pressure.  2. Right ventricular systolic function is normal. The right ventricular size is normal. Tricuspid regurgitation signal is inadequate for assessing PA pressure.  3. Left atrial size was severely dilated.  4. The aortic valve is tricuspid. Aortic valve regurgitation is not visualized. No aortic stenosis is present.  5. The inferior vena cava is normal in size with greater than 50% respiratory variability, suggesting right atrial pressure of 3 mmHg.  6. The mitral valve is abnormal. Bileaflet prolapse. Moderate mitral valve regurgitation. No evidence of mitral stenosis. Mitral regurgitation jet is poorly visualized. Suspect underestimating MR given significant  bileaflet prolapse with severely dilated left atrium and elevated E wave velocity. Would recommend TEE or cardiac MRI for further evaluation. FINDINGS  Left Ventricle: Left ventricular ejection fraction, by estimation, is 50 to 55%. The left ventricle has low normal function. The left ventricle has no regional wall motion abnormalities. The left ventricular internal cavity size was normal in size. There is mild left ventricular hypertrophy. Left ventricular diastolic parameters are consistent with Grade II diastolic dysfunction (pseudonormalization). Elevated left atrial pressure. Right Ventricle: The right ventricular size is normal. No increase in right ventricular wall thickness. Right ventricular systolic function is normal. Tricuspid regurgitation signal is inadequate for assessing PA pressure. Left Atrium: Left atrial size was severely dilated. Right Atrium: Right atrial size was normal in size. Pericardium: There is no evidence of pericardial effusion. Mitral Valve: The mitral valve is abnormal. Moderate mitral valve regurgitation. No evidence of mitral valve stenosis. Tricuspid Valve: The tricuspid valve is normal in structure. Tricuspid valve regurgitation is trivial. Aortic Valve: The aortic valve is tricuspid. Aortic valve regurgitation is not visualized. No aortic stenosis is present. Pulmonic Valve: The pulmonic valve was not well visualized. Pulmonic valve regurgitation is not visualized. Aorta: The aortic root is normal in size and structure. Venous: The inferior vena cava is normal in size with greater than 50% respiratory variability, suggesting right atrial pressure of 3 mmHg. IAS/Shunts: The interatrial septum was not well visualized.  LEFT VENTRICLE PLAX 2D LVIDd:         5.30 cm      Diastology LVIDs:         3.80 cm      LV e' medial:    5.98 cm/s LV PW:         1.30 cm      LV E/e' medial:  20.1 LVOT diam:     2.20 cm      LV e' lateral:   7.07 cm/s LV SV:  60           LV E/e' lateral:  17.0 LV SV Index:   29 LVOT Area:     3.80 cm  LV Volumes (MOD) LV vol d, MOD A2C: 123.0 ml LV vol d, MOD A4C: 104.0 ml LV vol s, MOD A2C: 73.6 ml LV vol s, MOD A4C: 51.3 ml LV SV MOD A2C:     49.4 ml LV SV MOD A4C:     104.0 ml LV SV MOD BP:      52.8 ml RIGHT VENTRICLE             IVC RV S prime:     16.90 cm/s  IVC diam: 1.70 cm TAPSE (M-mode): 2.5 cm LEFT ATRIUM              Index        RIGHT ATRIUM           Index LA diam:        5.10 cm  2.42 cm/m   RA Area:     17.80 cm LA Vol (A2C):   127.0 ml 60.38 ml/m  RA Volume:   48.10 ml  22.87 ml/m LA Vol (A4C):   114.0 ml 54.20 ml/m LA Biplane Vol: 125.0 ml 59.43 ml/m  AORTIC VALVE LVOT Vmax:   91.30 cm/s LVOT Vmean:  55.900 cm/s LVOT VTI:    0.158 m MITRAL VALVE MV Area (PHT): 3.37 cm     SHUNTS MV Decel Time: 225 msec     Systemic VTI:  0.16 m MR Peak grad: 134.1 mmHg    Systemic Diam: 2.20 cm MR Mean grad: 85.0 mmHg MR Vmax:      579.00 cm/s MR Vmean:     431.0 cm/s MV E velocity: 120.00 cm/s MV A velocity: 90.70 cm/s MV E/A ratio:  1.32 Oswaldo Milian MD Electronically signed by Oswaldo Milian MD Signature Date/Time: 07/28/2021/1:24:31 PM    Final     Scheduled Meds:  [MAR Hold] enoxaparin (LOVENOX) injection  40 mg Subcutaneous Q24H   [MAR Hold] insulin aspart  0-5 Units Subcutaneous QHS   [MAR Hold] insulin aspart  0-9 Units Subcutaneous TID WC   [MAR Hold] insulin glargine-yfgn  10 Units Subcutaneous Daily   [MAR Hold] sodium chloride flush  3 mL Intravenous Q12H   [MAR Hold] sodium chloride flush  3 mL Intravenous Q12H   [MAR Hold] sodium chloride flush  3 mL Intravenous Q12H   Continuous Infusions:  [MAR Hold] sodium chloride     sodium chloride     sodium chloride 20 mL/hr at 07/29/21 1001   sodium chloride     sodium chloride 1 mL/kg/hr (07/29/21 0537)     LOS: 1 day   Time spent: 28 minutes   Darliss Cheney, MD [facility]  07/29/2021, 11:49 AM  Please page via [facility] and do not message via secure chat  for anything urgent. Secure chat can be used for anything non urgent.  How to contact the [facility] Attending or Consulting provider [place] or covering provider during after hours [place], for this patient?  Check the care team in [facility] and look for a) attending/consulting [facility] provider listed and b) the [facility] team listed. Page or secure chat [phone]. Log into [email or web address] and use [place]'s universal password to access. If you do not have the password, please contact the hospital operator. Locate the [facility] provider you are looking for under [facility] and page to a number that  you can be directly reached. If you still have difficulty reaching the provider, please page the [facility] (Director on Call) for the Hospitalists listed on amion for assistance.

## 2021-07-29 NOTE — H&P (View-Only) (Signed)
Progress Note  Patient Name: Terry Johns Date of Encounter: 07/29/2021  Primary Cardiologist: Candee Furbish, MD   Subjective   No events overnight.  No further syncope.  No SOP.  No CP.  Wife has brought Personal assistant of fall.  Fall occurs after transitioning from a squat to stand.  Inpatient Medications    Scheduled Meds:  enoxaparin (LOVENOX) injection  40 mg Subcutaneous Q24H   insulin aspart  0-5 Units Subcutaneous QHS   insulin aspart  0-9 Units Subcutaneous TID WC   insulin glargine-yfgn  10 Units Subcutaneous Daily   sodium chloride flush  3 mL Intravenous Q12H   sodium chloride flush  3 mL Intravenous Q12H   sodium chloride flush  3 mL Intravenous Q12H   Continuous Infusions:  sodium chloride     sodium chloride     sodium chloride 20 mL/hr at 07/29/21 1001   sodium chloride 1 mL/kg/hr (07/29/21 0537)   PRN Meds: sodium chloride, sodium chloride, acetaminophen **OR** acetaminophen, sodium chloride flush, sodium chloride flush   Vital Signs    Vitals:   07/28/21 2002 07/28/21 2314 07/29/21 0441 07/29/21 0905  BP: 130/77 112/75 128/73 (!) 146/81  Pulse: 81 70 75 82  Resp: 20 19 18 20   Temp: 98.5 F (36.9 C) 98.3 F (36.8 C) 98.2 F (36.8 C) 98 F (36.7 C)  TempSrc: Oral Oral Oral Oral  SpO2: 94%   95%  Weight:   84.7 kg   Height:   6\' 1"  (1.854 m)     Intake/Output Summary (Last 24 hours) at 07/29/2021 1045 Last data filed at 07/29/2021 1694 Gross per 24 hour  Intake 812.53 ml  Output --  Net 812.53 ml   Filed Weights   07/27/21 1750 07/27/21 2255 07/29/21 0441  Weight: 87 kg 86 kg 84.7 kg    Telemetry    Frequent PVCs - Personally Reviewed  ECG    SR rate 75 T wave flattening no PVCs - Personally Reviewed  Physical Exam   Gen: no distress, well nourished   Neck: No JVD Cardiac: No Rubs or Gallops, holosystolic III/VI  Murmur, regular rate +2 radial pulses Respiratory: Clear to auscultation bilaterally, normal effort, normal   respiratory rate GI: Soft, nontender, non-distended  MS: No edema;  moves all extremities Integument: Skin feels warm Neuro:  At time of evaluation, alert and oriented to person/place/time/situation  Psych: Normal affect, patient feels anxious to get things moving   Labs    Chemistry Recent Labs  Lab 07/27/21 1831 07/28/21 0054 07/29/21 0047  NA 134* 139 139  K 5.4* 3.8 3.9  CL 105 105 105  CO2 23 26 28   GLUCOSE 202* 94 57*  BUN 16 13 13   CREATININE 0.95 0.84 0.84  CALCIUM 9.1 9.1 9.1  GFRNONAA >60 >60 >60  ANIONGAP 6 8 6      Hematology Recent Labs  Lab 07/27/21 1831 07/28/21 0054 07/29/21 0047  WBC 5.9 6.3 5.6  RBC 4.46 5.04 4.65  HGB 13.3 14.8 13.7  HCT 38.7* 42.8 39.7  MCV 86.8 84.9 85.4  MCH 29.8 29.4 29.5  MCHC 34.4 34.6 34.5  RDW 13.1 12.9 13.0  PLT 241 220 229    Cardiac EnzymesNo results for input(s): TROPONINI in the last 168 hours. No results for input(s): TROPIPOC in the last 168 hours.   BNPNo results for input(s): BNP, PROBNP in the last 168 hours.   DDimer  Recent Labs  Lab 07/28/21 1028  DDIMER <0.27  Radiology    CT Head Wo Contrast  Result Date: 07/27/2021 CLINICAL DATA:  Head trauma, syncope EXAM: CT HEAD WITHOUT CONTRAST TECHNIQUE: Contiguous axial images were obtained from the base of the skull through the vertex without intravenous contrast. COMPARISON:  None. FINDINGS: Brain: No evidence of large-territorial acute infarction. No parenchymal hemorrhage. No mass lesion. No extra-axial collection. No mass effect or midline shift. No hydrocephalus. Basilar cisterns are patent. Vascular: No hyperdense vessel. Skull: No acute fracture or focal lesion. Sinuses/Orbits: Paranasal sinuses and mastoid air cells are clear. The orbits are unremarkable. Other: None. IMPRESSION: No acute intracranial abnormality. Electronically Signed   By: Iven Finn M.D.   On: 07/27/2021 19:25   ECHOCARDIOGRAM COMPLETE  Result Date: 07/28/2021     ECHOCARDIOGRAM REPORT   Patient Name:   Terry Johns Date of Exam: 07/28/2021 Medical Rec #:  [id number]       Height:       73.0 in Accession #:    [id number]      Weight:       189.6 lb Date of Birth:  07/07/1956       BSA:          2.103 m Patient Age:    65 years        BP:           133/86 mmHg Patient Gender: M               HR:           73 bpm. Exam Location:  Inpatient Procedure: 2D Echo Indications:    syncope  History:        Patient has prior history of Echocardiogram examinations, most                 recent 03/15/2020. Ehlers-Danlos syndrome, Mitral Valve Prolapse,                 Arrythmias:PVC; Risk Factors:Diabetes.  Sonographer:    Johny Chess RDCS Referring Phys: [id number] TIMOTHY S OPYD  Sonographer Comments: Image acquisition challenging due to respiratory motion. IMPRESSIONS  1. Left ventricular ejection fraction, by estimation, is 50 to 55%. The left ventricle has low normal function. The left ventricle has no regional wall motion abnormalities. There is mild left ventricular hypertrophy. Left ventricular diastolic parameters are consistent with Grade II diastolic dysfunction (pseudonormalization). Elevated left atrial pressure.  2. Right ventricular systolic function is normal. The right ventricular size is normal. Tricuspid regurgitation signal is inadequate for assessing PA pressure.  3. Left atrial size was severely dilated.  4. The aortic valve is tricuspid. Aortic valve regurgitation is not visualized. No aortic stenosis is present.  5. The inferior vena cava is normal in size with greater than 50% respiratory variability, suggesting right atrial pressure of 3 mmHg.  6. The mitral valve is abnormal. Bileaflet prolapse. Moderate mitral valve regurgitation. No evidence of mitral stenosis. Mitral regurgitation jet is poorly visualized. Suspect underestimating MR given significant bileaflet prolapse with severely dilated left atrium and elevated E wave velocity. Would recommend TEE or  cardiac MRI for further evaluation. FINDINGS  Left Ventricle: Left ventricular ejection fraction, by estimation, is 50 to 55%. The left ventricle has low normal function. The left ventricle has no regional wall motion abnormalities. The left ventricular internal cavity size was normal in size. There is mild left ventricular hypertrophy. Left ventricular diastolic parameters are consistent with Grade II diastolic dysfunction (pseudonormalization). Elevated left atrial pressure. Right Ventricle: The right  ventricular size is normal. No increase in right ventricular wall thickness. Right ventricular systolic function is normal. Tricuspid regurgitation signal is inadequate for assessing PA pressure. Left Atrium: Left atrial size was severely dilated. Right Atrium: Right atrial size was normal in size. Pericardium: There is no evidence of pericardial effusion. Mitral Valve: The mitral valve is abnormal. Moderate mitral valve regurgitation. No evidence of mitral valve stenosis. Tricuspid Valve: The tricuspid valve is normal in structure. Tricuspid valve regurgitation is trivial. Aortic Valve: The aortic valve is tricuspid. Aortic valve regurgitation is not visualized. No aortic stenosis is present. Pulmonic Valve: The pulmonic valve was not well visualized. Pulmonic valve regurgitation is not visualized. Aorta: The aortic root is normal in size and structure. Venous: The inferior vena cava is normal in size with greater than 50% respiratory variability, suggesting right atrial pressure of 3 mmHg. IAS/Shunts: The interatrial septum was not well visualized.  LEFT VENTRICLE PLAX 2D LVIDd:         5.30 cm      Diastology LVIDs:         3.80 cm      LV e' medial:    5.98 cm/s LV PW:         1.30 cm      LV E/e' medial:  20.1 LVOT diam:     2.20 cm      LV e' lateral:   7.07 cm/s LV SV:         60           LV E/e' lateral: 17.0 LV SV Index:   29 LVOT Area:     3.80 cm  LV Volumes (MOD) LV vol d, MOD A2C: 123.0 ml LV vol d,  MOD A4C: 104.0 ml LV vol s, MOD A2C: 73.6 ml LV vol s, MOD A4C: 51.3 ml LV SV MOD A2C:     49.4 ml LV SV MOD A4C:     104.0 ml LV SV MOD BP:      52.8 ml RIGHT VENTRICLE             IVC RV S prime:     16.90 cm/s  IVC diam: 1.70 cm TAPSE (M-mode): 2.5 cm LEFT ATRIUM              Index        RIGHT ATRIUM           Index LA diam:        5.10 cm  2.42 cm/m   RA Area:     17.80 cm LA Vol (A2C):   127.0 ml 60.38 ml/m  RA Volume:   48.10 ml  22.87 ml/m LA Vol (A4C):   114.0 ml 54.20 ml/m LA Biplane Vol: 125.0 ml 59.43 ml/m  AORTIC VALVE LVOT Vmax:   91.30 cm/s LVOT Vmean:  55.900 cm/s LVOT VTI:    0.158 m MITRAL VALVE MV Area (PHT): 3.37 cm     SHUNTS MV Decel Time: 225 msec     Systemic VTI:  0.16 m MR Peak grad: 134.1 mmHg    Systemic Diam: 2.20 cm MR Mean grad: 85.0 mmHg MR Vmax:      579.00 cm/s MR Vmean:     431.0 cm/s MV E velocity: 120.00 cm/s MV A velocity: 90.70 cm/s MV E/A ratio:  1.32 Oswaldo Milian MD Electronically signed by Oswaldo Milian MD Signature Date/Time: 07/28/2021/1:24:31 PM    Final     Cardiac Studies   Echo: Moderate MR with MAD <  8 mm, and bileaflet prolapse, EF is < 60% and new drop from 2021  Patient Profile     65 y.o. 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 Johnsie Cancel and Ali Lowe) - if questions of valve severity or LVEF despite this, will get CMR for MR, LVEF, Mitral annular disjunction, and PVCs for LGE foci - I suspect vasovagal syncope as etiology of his fall  For questions or updates, please contact [facility] Please consult [email or web address] for contact info under Cardiology/STEMI.       Signed, Werner Lean, MD  07/29/2021, 10:45 AM

## 2021-07-29 NOTE — Progress Notes (Signed)
  Echocardiogram Echocardiogram Transesophageal has been performed.  Merrie Roof F 07/29/2021, 12:48 PM

## 2021-07-29 NOTE — Anesthesia Postprocedure Evaluation (Signed)
Anesthesia Post Note  Patient: Terry Johns  Procedure(s) Performed: TRANSESOPHAGEAL ECHOCARDIOGRAM (TEE)     Patient location during evaluation: Endoscopy Anesthesia Type: MAC Level of consciousness: awake and alert Pain management: pain level controlled Vital Signs Assessment: post-procedure vital signs reviewed and stable Respiratory status: spontaneous breathing, nonlabored ventilation and respiratory function stable Cardiovascular status: blood pressure returned to baseline and stable Postop Assessment: no apparent nausea or vomiting Anesthetic complications: no   No notable events documented.  Last Vitals:  Vitals:   07/29/21 1254 07/29/21 1304  BP: 116/71 117/74  Pulse: 72 71  Resp: 17 13  Temp:    SpO2: 97% 96%    Last Pain:  Vitals:   07/29/21 1304  TempSrc:   PainSc: 0-No pain                 Merlinda Frederick

## 2021-07-29 NOTE — Interval H&P Note (Signed)
History and Physical Interval Note:  07/29/2021 1:35 PM  Terry Johns  has presented today for surgery, with the diagnosis of syncope.  The various methods of treatment have been discussed with the patient and family. After consideration of risks, benefits and other options for treatment, the patient has consented to  Procedure(s): RIGHT/LEFT HEART CATH AND CORONARY ANGIOGRAPHY (N/A) as a surgical intervention.  The patient's history has been reviewed, patient examined, no change in status, stable for surgery.  I have reviewed the patient's chart and labs.  Questions were answered to the patient's satisfaction.    Cath Lab Visit (complete for each Cath Lab visit)  Clinical Evaluation Leading to the Procedure:   ACS: No.  Non-ACS:    Anginal Classification: No Symptoms  Anti-ischemic medical therapy: No Therapy  Non-Invasive Test Results: No non-invasive testing performed  Prior CABG: No previous CABG        Lauree Chandler

## 2021-07-29 NOTE — Progress Notes (Addendum)
Progress Note  Patient Name: Terry Johns Date of Encounter: 07/29/2021  Primary Cardiologist: Candee Furbish, MD   Subjective   No events overnight.  No further syncope.  No SOP.  No CP.  Wife has brought Personal assistant of fall.  Fall occurs after transitioning from a squat to stand.  Inpatient Medications    Scheduled Meds:  enoxaparin (LOVENOX) injection  40 mg Subcutaneous Q24H   insulin aspart  0-5 Units Subcutaneous QHS   insulin aspart  0-9 Units Subcutaneous TID WC   insulin glargine-yfgn  10 Units Subcutaneous Daily   sodium chloride flush  3 mL Intravenous Q12H   sodium chloride flush  3 mL Intravenous Q12H   sodium chloride flush  3 mL Intravenous Q12H   Continuous Infusions:  sodium chloride     sodium chloride     sodium chloride 20 mL/hr at 07/29/21 1001   sodium chloride 1 mL/kg/hr (07/29/21 0537)   PRN Meds: sodium chloride, sodium chloride, acetaminophen **OR** acetaminophen, sodium chloride flush, sodium chloride flush   Vital Signs    Vitals:   07/28/21 2002 07/28/21 2314 07/29/21 0441 07/29/21 0905  BP: 130/77 112/75 128/73 (!) 146/81  Pulse: 81 70 75 82  Resp: 20 19 18 20   Temp: 98.5 F (36.9 C) 98.3 F (36.8 C) 98.2 F (36.8 C) 98 F (36.7 C)  TempSrc: Oral Oral Oral Oral  SpO2: 94%   95%  Weight:   84.7 kg   Height:   6\' 1"  (1.854 m)     Intake/Output Summary (Last 24 hours) at 07/29/2021 1045 Last data filed at 07/29/2021 5681 Gross per 24 hour  Intake 812.53 ml  Output --  Net 812.53 ml   Filed Weights   07/27/21 1750 07/27/21 2255 07/29/21 0441  Weight: 87 kg 86 kg 84.7 kg    Telemetry    Frequent PVCs - Personally Reviewed  ECG    SR rate 75 T wave flattening no PVCs - Personally Reviewed  Physical Exam   Gen: no distress, well nourished   Neck: No JVD Cardiac: No Rubs or Gallops, holosystolic III/VI  Murmur, regular rate +2 radial pulses Respiratory: Clear to auscultation bilaterally, normal effort, normal   respiratory rate GI: Soft, nontender, non-distended  MS: No edema;  moves all extremities Integument: Skin feels warm Neuro:  At time of evaluation, alert and oriented to person/place/time/situation  Psych: Normal affect, patient feels anxious to get things moving   Labs    Chemistry Recent Labs  Lab 07/27/21 1831 07/28/21 0054 07/29/21 0047  NA 134* 139 139  K 5.4* 3.8 3.9  CL 105 105 105  CO2 23 26 28   GLUCOSE 202* 94 57*  BUN 16 13 13   CREATININE 0.95 0.84 0.84  CALCIUM 9.1 9.1 9.1  GFRNONAA >60 >60 >60  ANIONGAP 6 8 6      Hematology Recent Labs  Lab 07/27/21 1831 07/28/21 0054 07/29/21 0047  WBC 5.9 6.3 5.6  RBC 4.46 5.04 4.65  HGB 13.3 14.8 13.7  HCT 38.7* 42.8 39.7  MCV 86.8 84.9 85.4  MCH 29.8 29.4 29.5  MCHC 34.4 34.6 34.5  RDW 13.1 12.9 13.0  PLT 241 220 229    Cardiac EnzymesNo results for input(s): TROPONINI in the last 168 hours. No results for input(s): TROPIPOC in the last 168 hours.   BNPNo results for input(s): BNP, PROBNP in the last 168 hours.   DDimer  Recent Labs  Lab 07/28/21 1028  DDIMER <0.27  Radiology    CT Head Wo Contrast  Result Date: 07/27/2021 CLINICAL DATA:  Head trauma, syncope EXAM: CT HEAD WITHOUT CONTRAST TECHNIQUE: Contiguous axial images were obtained from the base of the skull through the vertex without intravenous contrast. COMPARISON:  None. FINDINGS: Brain: No evidence of large-territorial acute infarction. No parenchymal hemorrhage. No mass lesion. No extra-axial collection. No mass effect or midline shift. No hydrocephalus. Basilar cisterns are patent. Vascular: No hyperdense vessel. Skull: No acute fracture or focal lesion. Sinuses/Orbits: Paranasal sinuses and mastoid air cells are clear. The orbits are unremarkable. Other: None. IMPRESSION: No acute intracranial abnormality. Electronically Signed   By: Iven Finn M.D.   On: 07/27/2021 19:25   ECHOCARDIOGRAM COMPLETE  Result Date: 07/28/2021     ECHOCARDIOGRAM REPORT   Patient Name:   Terry Johns Date of Exam: 07/28/2021 Medical Rec #:  [id number]       Height:       73.0 in Accession #:    [id number]      Weight:       189.6 lb Date of Birth:  1955/11/05       BSA:          2.103 m Patient Age:    65 years        BP:           133/86 mmHg Patient Gender: M               HR:           73 bpm. Exam Location:  Inpatient Procedure: 2D Echo Indications:    syncope  History:        Patient has prior history of Echocardiogram examinations, most                 recent 03/15/2020. Ehlers-Danlos syndrome, Mitral Valve Prolapse,                 Arrythmias:PVC; Risk Factors:Diabetes.  Sonographer:    Johny Chess RDCS Referring Phys: [id number] TIMOTHY S OPYD  Sonographer Comments: Image acquisition challenging due to respiratory motion. IMPRESSIONS  1. Left ventricular ejection fraction, by estimation, is 50 to 55%. The left ventricle has low normal function. The left ventricle has no regional wall motion abnormalities. There is mild left ventricular hypertrophy. Left ventricular diastolic parameters are consistent with Grade II diastolic dysfunction (pseudonormalization). Elevated left atrial pressure.  2. Right ventricular systolic function is normal. The right ventricular size is normal. Tricuspid regurgitation signal is inadequate for assessing PA pressure.  3. Left atrial size was severely dilated.  4. The aortic valve is tricuspid. Aortic valve regurgitation is not visualized. No aortic stenosis is present.  5. The inferior vena cava is normal in size with greater than 50% respiratory variability, suggesting right atrial pressure of 3 mmHg.  6. The mitral valve is abnormal. Bileaflet prolapse. Moderate mitral valve regurgitation. No evidence of mitral stenosis. Mitral regurgitation jet is poorly visualized. Suspect underestimating MR given significant bileaflet prolapse with severely dilated left atrium and elevated E wave velocity. Would recommend TEE or  cardiac MRI for further evaluation. FINDINGS  Left Ventricle: Left ventricular ejection fraction, by estimation, is 50 to 55%. The left ventricle has low normal function. The left ventricle has no regional wall motion abnormalities. The left ventricular internal cavity size was normal in size. There is mild left ventricular hypertrophy. Left ventricular diastolic parameters are consistent with Grade II diastolic dysfunction (pseudonormalization). Elevated left atrial pressure. Right Ventricle: The right  ventricular size is normal. No increase in right ventricular wall thickness. Right ventricular systolic function is normal. Tricuspid regurgitation signal is inadequate for assessing PA pressure. Left Atrium: Left atrial size was severely dilated. Right Atrium: Right atrial size was normal in size. Pericardium: There is no evidence of pericardial effusion. Mitral Valve: The mitral valve is abnormal. Moderate mitral valve regurgitation. No evidence of mitral valve stenosis. Tricuspid Valve: The tricuspid valve is normal in structure. Tricuspid valve regurgitation is trivial. Aortic Valve: The aortic valve is tricuspid. Aortic valve regurgitation is not visualized. No aortic stenosis is present. Pulmonic Valve: The pulmonic valve was not well visualized. Pulmonic valve regurgitation is not visualized. Aorta: The aortic root is normal in size and structure. Venous: The inferior vena cava is normal in size with greater than 50% respiratory variability, suggesting right atrial pressure of 3 mmHg. IAS/Shunts: The interatrial septum was not well visualized.  LEFT VENTRICLE PLAX 2D LVIDd:         5.30 cm      Diastology LVIDs:         3.80 cm      LV e' medial:    5.98 cm/s LV PW:         1.30 cm      LV E/e' medial:  20.1 LVOT diam:     2.20 cm      LV e' lateral:   7.07 cm/s LV SV:         60           LV E/e' lateral: 17.0 LV SV Index:   29 LVOT Area:     3.80 cm  LV Volumes (MOD) LV vol d, MOD A2C: 123.0 ml LV vol d,  MOD A4C: 104.0 ml LV vol s, MOD A2C: 73.6 ml LV vol s, MOD A4C: 51.3 ml LV SV MOD A2C:     49.4 ml LV SV MOD A4C:     104.0 ml LV SV MOD BP:      52.8 ml RIGHT VENTRICLE             IVC RV S prime:     16.90 cm/s  IVC diam: 1.70 cm TAPSE (M-mode): 2.5 cm LEFT ATRIUM              Index        RIGHT ATRIUM           Index LA diam:        5.10 cm  2.42 cm/m   RA Area:     17.80 cm LA Vol (A2C):   127.0 ml 60.38 ml/m  RA Volume:   48.10 ml  22.87 ml/m LA Vol (A4C):   114.0 ml 54.20 ml/m LA Biplane Vol: 125.0 ml 59.43 ml/m  AORTIC VALVE LVOT Vmax:   91.30 cm/s LVOT Vmean:  55.900 cm/s LVOT VTI:    0.158 m MITRAL VALVE MV Area (PHT): 3.37 cm     SHUNTS MV Decel Time: 225 msec     Systemic VTI:  0.16 m MR Peak grad: 134.1 mmHg    Systemic Diam: 2.20 cm MR Mean grad: 85.0 mmHg MR Vmax:      579.00 cm/s MR Vmean:     431.0 cm/s MV E velocity: 120.00 cm/s MV A velocity: 90.70 cm/s MV E/A ratio:  1.32 Oswaldo Milian MD Electronically signed by Oswaldo Milian MD Signature Date/Time: 07/28/2021/1:24:31 PM    Final     Cardiac Studies   Echo: Moderate MR with MAD <  8 mm, and bileaflet prolapse, EF is < 60% and new drop from 2021  Patient Profile     65 y.o. 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 Johnsie Cancel and Ali Lowe) - if questions of valve severity or LVEF despite this, will get CMR for MR, LVEF, Mitral annular disjunction, and PVCs for LGE foci - I suspect vasovagal syncope as etiology of his fall  For questions or updates, please contact [facility] Please consult [email or web address] for contact info under Cardiology/STEMI.       Signed, Werner Lean, MD  07/29/2021, 10:45 AM

## 2021-07-29 NOTE — CV Procedure (Signed)
TEE: Anesthesia: Propofol  Bileaflet Prolapse with predominant lesion being P1 leaflet  With anteriorly directed MR. PISA 1.0 cm with ERO 0.34 cm2 and RVolume 48 cc By morphology, PISA and color flow MR is severe  Normal AV  Normal EF 55%   Mild TR  No ASD/PFO  No LAA thormbus    Normal RV  Patient to have right and left heart cath subsequenlty  Jenkins Rouge MD [facility]

## 2021-07-29 NOTE — Transfer of Care (Signed)
Immediate Anesthesia Transfer of Care Note  Patient: JDEN WANT  Procedure(s) Performed: TRANSESOPHAGEAL ECHOCARDIOGRAM (TEE)  Patient Location: Endoscopy Unit  Anesthesia Type:MAC  Level of Consciousness: awake, oriented and patient cooperative  Airway & Oxygen Therapy: Patient Spontanous Breathing and Patient connected to nasal cannula oxygen  Post-op Assessment: Report given to RN and Post -op Vital signs reviewed and stable  Post vital signs: Reviewed  Last Vitals:  Vitals Value Taken Time  BP 111/67 07/29/21 1244  Temp 36.4 C 07/29/21 1244  Pulse 72 07/29/21 1253  Resp 17 07/29/21 1253  SpO2 97 % 07/29/21 1253  Vitals shown include unvalidated device data.  Last Pain:  Vitals:   07/29/21 1244  TempSrc: Oral  PainSc: 0-No pain      Patients Stated Pain Goal: 0 (68/11/57 2620)  Complications: No notable events documented.

## 2021-07-29 NOTE — Progress Notes (Signed)
TR band removed with no complications. Patient educated on not moving arm for 24 hours. All questions answered. Patient's call bell in reach.

## 2021-07-29 NOTE — Progress Notes (Signed)
Patient refuses to have his groin shave as ordered.

## 2021-07-29 NOTE — Interval H&P Note (Signed)
History and Physical Interval Note:  07/29/2021 12:13 PM  Terry Johns  has presented today for surgery, with the diagnosis of severe MR.  The various methods of treatment have been discussed with the patient and family. After consideration of risks, benefits and other options for treatment, the patient has consented to  Procedure(s): TRANSESOPHAGEAL ECHOCARDIOGRAM (TEE) (N/A) as a surgical intervention.  The patient's history has been reviewed, patient examined, no change in status, stable for surgery.  I have reviewed the patient's chart and labs.  Questions were answered to the patient's satisfaction.     Jenkins Rouge

## 2021-07-29 NOTE — Anesthesia Preprocedure Evaluation (Addendum)
Anesthesia Evaluation  Patient identified by MRN, date of birth, ID band Patient awake    Reviewed: Allergy & Precautions, NPO status , Patient's Chart, lab work & pertinent test results  Airway Mallampati: II  TM Distance: >3 FB Neck ROM: Full    Dental no notable dental hx. (+) Dental Advisory Given   Pulmonary former smoker,    Pulmonary exam normal breath sounds clear to auscultation       Cardiovascular Exercise Tolerance: Good Normal cardiovascular exam+ Valvular Problems/Murmurs MVP and MR  Rhythm:Regular Rate:Normal  EKG 07/29/21: Normal sinus rhythm Nonspecific T wave abnormality Abnormal ECG  ECHO:  1. Left ventricular ejection fraction, by estimation, is 50 to 55%. The  left ventricle has low normal function. The left ventricle has no regional  wall motion abnormalities. There is mild left ventricular hypertrophy.  Left ventricular diastolic  parameters are consistent with Grade II diastolic dysfunction  (pseudonormalization). Elevated left atrial pressure.  2. Right ventricular systolic function is normal. The right ventricular  size is normal. Tricuspid regurgitation signal is inadequate for assessing  PA pressure.  3. Left atrial size was severely dilated.  4. The aortic valve is tricuspid. Aortic valve regurgitation is not  visualized. No aortic stenosis is present.  5. The inferior vena cava is normal in size with greater than 50%  respiratory variability, suggesting right atrial pressure of 3 mmHg.  6. The mitral valve is abnormal. Bileaflet prolapse. Moderate mitral  valve regurgitation. No evidence of mitral stenosis. Mitral regurgitation  jet is poorly visualized. Suspect underestimating MR given significant  bileaflet prolapse with severely  dilated left atrium and elevated E wave velocity. Would recommend TEE or  cardiac MRI for further evaluation.   Neuro/Psych Anxiety    GI/Hepatic Neg  liver ROS, +dysphagia   Endo/Other  diabetes, Well Controlled, Type 2Ehlers-Danlos  Renal/GU   negative genitourinary   Musculoskeletal negative musculoskeletal ROS (+)   Abdominal   Peds negative pediatric ROS (+)  Hematology negative hematology ROS (+)   Anesthesia Other Findings   Reproductive/Obstetrics negative OB ROS                          Anesthesia Physical Anesthesia Plan  ASA: 3  Anesthesia Plan: MAC   Post-op Pain Management:    Induction: Intravenous  PONV Risk Score and Plan: 2 and Treatment may vary due to age or medical condition  Airway Management Planned: Natural Airway and Simple Face Mask  Additional Equipment: None  Intra-op Plan:   Post-operative Plan:   Informed Consent: I have reviewed the patients History and Physical, chart, labs and discussed the procedure including the risks, benefits and alternatives for the proposed anesthesia with the patient or authorized representative who has indicated his/her understanding and acceptance.     Dental advisory given  Plan Discussed with: CRNA and Anesthesiologist  Anesthesia Plan Comments:        Anesthesia Quick Evaluation

## 2021-07-30 ENCOUNTER — Encounter (HOSPITAL_COMMUNITY): Payer: Self-pay | Admitting: Cardiovascular Disease

## 2021-07-30 ENCOUNTER — Telehealth: Payer: Self-pay

## 2021-07-30 DIAGNOSIS — R55 Syncope and collapse: Secondary | ICD-10-CM

## 2021-07-30 DIAGNOSIS — I341 Nonrheumatic mitral (valve) prolapse: Secondary | ICD-10-CM

## 2021-07-30 DIAGNOSIS — I340 Nonrheumatic mitral (valve) insufficiency: Secondary | ICD-10-CM | POA: Diagnosis not present

## 2021-07-30 DIAGNOSIS — I493 Ventricular premature depolarization: Secondary | ICD-10-CM

## 2021-07-30 LAB — BASIC METABOLIC PANEL
Anion gap: 5 (ref 5–15)
BUN: 10 mg/dL (ref 8–23)
CO2: 28 mmol/L (ref 22–32)
Calcium: 8.8 mg/dL — ABNORMAL LOW (ref 8.9–10.3)
Chloride: 104 mmol/L (ref 98–111)
Creatinine, Ser: 0.9 mg/dL (ref 0.61–1.24)
GFR, Estimated: >60 mL/min (ref >60)
Glucose, Bld: 96 mg/dL (ref 70–99)
Potassium: 4.4 mmol/L (ref 3.5–5.1)
Sodium: 137 mmol/L (ref 135–145)

## 2021-07-30 LAB — HEMOGLOBIN A1C
Hgb A1c MFr Bld: 7.4 % — ABNORMAL HIGH (ref 4.8–5.6)
Mean Plasma Glucose: 166 mg/dL

## 2021-07-30 LAB — CBC
HCT: 39.1 % (ref 39.0–52.0)
Hemoglobin: 13.5 g/dL (ref 13.0–17.0)
MCH: 29.3 pg (ref 26.0–34.0)
MCHC: 34.5 g/dL (ref 30.0–36.0)
MCV: 85 fL (ref 80.0–100.0)
Platelets: 207 10*3/uL (ref 150–400)
RBC: 4.6 MIL/uL (ref 4.22–5.81)
RDW: 12.8 % (ref 11.5–15.5)
WBC: 5.4 10*3/uL (ref 4.0–10.5)
nRBC: 0 % (ref 0.0–0.2)

## 2021-07-30 LAB — GLUCOSE, CAPILLARY
Glucose-Capillary: 140 mg/dL — ABNORMAL HIGH (ref 70–99)
Glucose-Capillary: 166 mg/dL — ABNORMAL HIGH (ref 70–99)
Glucose-Capillary: 133 mg/dL — ABNORMAL HIGH (ref 70–99)
Glucose-Capillary: 138 mg/dL — ABNORMAL HIGH (ref 70–99)

## 2021-07-30 MED ORDER — LORAZEPAM 2 MG/ML IJ SOLN
1.0000 mg | Freq: Once | INTRAMUSCULAR | Status: AC
  Administered 2021-07-31: 1 mg via INTRAVENOUS
  Filled 2021-07-30: qty 1

## 2021-07-30 NOTE — Telephone Encounter (Signed)
Per Dr. Gasper Sells, formal referral to Dr. Cheree Ditto placed. Imaging powershared. Will await cMRI and share when complete. Dr. Aundra Millet assistant notified.

## 2021-07-30 NOTE — Progress Notes (Signed)
Progress Note  Patient Name: Terry Johns Date of Encounter: 07/30/2021  Primary Cardiologist: Candee Furbish, MD   Subjective   No events overnight.  LHC with no obstructive disease; RHC without elevated PCWP.  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  Inpatient Medications    Scheduled Meds:  enoxaparin (LOVENOX) injection  40 mg Subcutaneous Q24H   insulin aspart  0-5 Units Subcutaneous QHS   insulin aspart  0-9 Units Subcutaneous TID WC   insulin glargine-yfgn  10 Units Subcutaneous Daily   LORazepam  1 mg Intravenous Once   sodium chloride flush  3 mL Intravenous Q12H   sodium chloride flush  3 mL Intravenous Q12H   sodium chloride flush  3 mL Intravenous Q12H   sodium chloride flush  3 mL Intravenous Q12H   Continuous Infusions:  sodium chloride     sodium chloride     PRN Meds: sodium chloride, sodium chloride, acetaminophen **OR** acetaminophen, sodium chloride flush, sodium chloride flush   Vital Signs    Vitals:   07/29/21 2000 07/29/21 2251 07/30/21 0603 07/30/21 0728  BP:  118/80 122/80 124/88  Pulse:  78 79 70  Resp: 18 18 20 17   Temp:  98.2 F (36.8 C) 97.7 F (36.5 C) (!) 97.5 F (36.4 C)  TempSrc:  Oral Oral Oral  SpO2:  96% 98% 95%  Weight:   87.6 kg   Height:        Intake/Output Summary (Last 24 hours) at 07/30/2021 0959 Last data filed at 07/29/2021 1950 Gross per 24 hour  Intake 1844.57 ml  Output --  Net 1844.57 ml   Filed Weights   07/27/21 2255 07/29/21 0441 07/30/21 0603  Weight: 86 kg 84.7 kg 87.6 kg    Telemetry    Frequent PVCs - Personally Reviewed  ECG    No new last 24 - Personally Reviewed  Physical Exam   Gen: no distress, well nourished   Neck: No JVD Cardiac: No Rubs or Gallops, holosystolic IV/VI  murmur, regular rate +2 radial pulses Respiratory: Clear to auscultation bilaterally, normal effort, normal  respiratory rate GI: Soft, nontender, non-distended  MS: No edema;  moves all  extremities Integument: Skin feels warm Neuro:  At time of evaluation, alert and oriented to person/place/time/situation  Psych: Normal affect, patient feels ok   Labs    Chemistry Recent Labs  Lab 07/28/21 0054 07/29/21 0047 07/29/21 1401 07/29/21 1402 07/30/21 0040  NA 139 139 140 141 137  K 3.8 3.9 4.1 3.8 4.4  CL 105 105  --   --  104  CO2 26 28  --   --  28  GLUCOSE 94 57*  --   --  96  BUN 13 13  --   --  10  CREATININE 0.84 0.84  --   --  0.90  CALCIUM 9.1 9.1  --   --  8.8*  GFRNONAA >60 >60  --   --  >60  ANIONGAP 8 6  --   --  5     Hematology Recent Labs  Lab 07/28/21 0054 07/29/21 0047 07/29/21 1401 07/29/21 1402 07/30/21 0040  WBC 6.3 5.6  --   --  5.4  RBC 5.04 4.65  --   --  4.60  HGB 14.8 13.7 12.9* 12.2* 13.5  HCT 42.8 39.7 38.0* 36.0* 39.1  MCV 84.9 85.4  --   --  85.0  MCH 29.4 29.5  --   --  29.3  MCHC 34.6 34.5  --   --  34.5  RDW 12.9 13.0  --   --  12.8  PLT 220 229  --   --  207    Cardiac EnzymesNo results for input(s): TROPONINI in the last 168 hours. No results for input(s): TROPIPOC in the last 168 hours.   BNPNo results for input(s): BNP, PROBNP in the last 168 hours.   DDimer  Recent Labs  Lab 07/28/21 1028  DDIMER <0.27     Radiology    CARDIAC CATHETERIZATION  Result Date: 07/29/2021 No angiographic evidence of CAD Normal LVEDP Recommendations: No further ischemic workup.   ECHOCARDIOGRAM COMPLETE  Result Date: 07/28/2021    ECHOCARDIOGRAM REPORT   Patient Name:   Terry Johns [facility] Date of Exam: 07/28/2021 Medical Rec #:  [id number]       Height:       73.0 in Accession #:    [id number]      Weight:       189.6 lb Date of Birth:  [birth year]       BSA:          2.103 m Patient Age:    65 years        BP:           133/86 mmHg Patient Gender: M               HR:           73 bpm. Exam Location:  Inpatient Procedure: 2D Echo Indications:    syncope  History:        Patient has prior history of Echocardiogram examinations,  most                 recent 03/15/2020. Ehlers-Danlos syndrome, Mitral Valve Prolapse,                 Arrythmias:PVC; Risk Factors:Diabetes.  Sonographer:    Johny Chess RDCS Referring Phys: [id number] TIMOTHY S OPYD  Sonographer Comments: Image acquisition challenging due to respiratory motion. IMPRESSIONS  1. Left ventricular ejection fraction, by estimation, is 50 to 55%. The left ventricle has low normal function. The left ventricle has no regional wall motion abnormalities. There is mild left ventricular hypertrophy. Left ventricular diastolic parameters are consistent with Grade II diastolic dysfunction (pseudonormalization). Elevated left atrial pressure.  2. Right ventricular systolic function is normal. The right ventricular size is normal. Tricuspid regurgitation signal is inadequate for assessing PA pressure.  3. Left atrial size was severely dilated.  4. The aortic valve is tricuspid. Aortic valve regurgitation is not visualized. No aortic stenosis is present.  5. The inferior vena cava is normal in size with greater than 50% respiratory variability, suggesting right atrial pressure of 3 mmHg.  6. The mitral valve is abnormal. Bileaflet prolapse. Moderate mitral valve regurgitation. No evidence of mitral stenosis. Mitral regurgitation jet is poorly visualized. Suspect underestimating MR given significant bileaflet prolapse with severely dilated left atrium and elevated E wave velocity. Would recommend TEE or cardiac MRI for further evaluation. FINDINGS  Left Ventricle: Left ventricular ejection fraction, by estimation, is 50 to 55%. The left ventricle has low normal function. The left ventricle has no regional wall motion abnormalities. The left ventricular internal cavity size was normal in size. There is mild left ventricular hypertrophy. Left ventricular diastolic parameters are consistent with Grade II diastolic dysfunction (pseudonormalization). Elevated left atrial pressure. Right Ventricle: The  right ventricular size is normal. No increase in right ventricular wall  thickness. Right ventricular systolic function is normal. Tricuspid regurgitation signal is inadequate for assessing PA pressure. Left Atrium: Left atrial size was severely dilated. Right Atrium: Right atrial size was normal in size. Pericardium: There is no evidence of pericardial effusion. Mitral Valve: The mitral valve is abnormal. Moderate mitral valve regurgitation. No evidence of mitral valve stenosis. Tricuspid Valve: The tricuspid valve is normal in structure. Tricuspid valve regurgitation is trivial. Aortic Valve: The aortic valve is tricuspid. Aortic valve regurgitation is not visualized. No aortic stenosis is present. Pulmonic Valve: The pulmonic valve was not well visualized. Pulmonic valve regurgitation is not visualized. Aorta: The aortic root is normal in size and structure. Venous: The inferior vena cava is normal in size with greater than 50% respiratory variability, suggesting right atrial pressure of 3 mmHg. IAS/Shunts: The interatrial septum was not well visualized.  LEFT VENTRICLE PLAX 2D LVIDd:         5.30 cm      Diastology LVIDs:         3.80 cm      LV e' medial:    5.98 cm/s LV PW:         1.30 cm      LV E/e' medial:  20.1 LVOT diam:     2.20 cm      LV e' lateral:   7.07 cm/s LV SV:         60           LV E/e' lateral: 17.0 LV SV Index:   29 LVOT Area:     3.80 cm  LV Volumes (MOD) LV vol d, MOD A2C: 123.0 ml LV vol d, MOD A4C: 104.0 ml LV vol s, MOD A2C: 73.6 ml LV vol s, MOD A4C: 51.3 ml LV SV MOD A2C:     49.4 ml LV SV MOD A4C:     104.0 ml LV SV MOD BP:      52.8 ml RIGHT VENTRICLE             IVC RV S prime:     16.90 cm/s  IVC diam: 1.70 cm TAPSE (M-mode): 2.5 cm LEFT ATRIUM              Index        RIGHT ATRIUM           Index LA diam:        5.10 cm  2.42 cm/m   RA Area:     17.80 cm LA Vol (A2C):   127.0 ml 60.38 ml/m  RA Volume:   48.10 ml  22.87 ml/m LA Vol (A4C):   114.0 ml 54.20 ml/m LA Biplane  Vol: 125.0 ml 59.43 ml/m  AORTIC VALVE LVOT Vmax:   91.30 cm/s LVOT Vmean:  55.900 cm/s LVOT VTI:    0.158 m MITRAL VALVE MV Area (PHT): 3.37 cm     SHUNTS MV Decel Time: 225 msec     Systemic VTI:  0.16 m MR Peak grad: 134.1 mmHg    Systemic Diam: 2.20 cm MR Mean grad: 85.0 mmHg MR Vmax:      579.00 cm/s MR Vmean:     431.0 cm/s MV E velocity: 120.00 cm/s MV A velocity: 90.70 cm/s MV E/A ratio:  1.32 Oswaldo Milian MD Electronically signed by Oswaldo Milian MD Signature Date/Time: 07/28/2021/1:24:31 PM    Final    ECHO TEE  Result Date: 07/29/2021    TRANSESOPHOGEAL ECHO REPORT   Patient Name:   Terry Johns  [facility] Date of Exam: 07/29/2021 Medical Rec #:  [id number]       Height:       73.0 in Accession #:    [id number]      Weight:       186.7 lb Date of Birth:  Oct 11, 1955       BSA:          2.090 m Patient Age:    72 years        BP:           100/72 mmHg Patient Gender: M               HR:           92 bpm. Exam Location:  Inpatient Procedure: Transesophageal Echo Indications:    mitral valve prolapse  History:        Patient has prior history of Echocardiogram examinations. Mitral                 Valve Disease.  Sonographer:    Merrie Roof RDCS Referring Phys: [place]: The transesophogeal probe was passed without difficulty through the esophogus of the patient. Sedation performed by different physician. The patient's vital signs; including heart rate, blood pressure, and oxygen saturation; remained stable throughout the procedure. The patient developed no complications during the procedure. IMPRESSIONS  1. Left ventricular ejection fraction, by estimation, is 60 to 65%. The left ventricle has normal function. The left ventricle has no regional wall motion abnormalities.  2. Right ventricular systolic function is normal. The right ventricular size is normal.  3. Left atrial size was severely dilated. No left atrial/left atrial appendage thrombus was detected.  4. Right  atrial size was mildly dilated.  5. 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 Blunted systolic PV signals , PISA radium 1.0 cm and color flow suggest severe MR ERO 0.34 cm2 and RVolum 48 cc suggest moderate to severe MR . The mitral valve is myxomatous. Severe mitral valve regurgitation. No evidence of mitral stenosis.  6. The aortic valve is normal in structure. Aortic valve regurgitation is not visualized. No aortic stenosis is present.  7. The inferior vena cava is normal in size with greater than 50% respiratory variability, suggesting right atrial pressure of 3 mmHg. Conclusion(s)/Recommendation(s): Normal biventricular function without evidence of hemodynamically significant valvular heart disease. FINDINGS  Left Ventricle: Left ventricular ejection fraction, by estimation, is 60 to 65%. The left ventricle has normal function. The left ventricle has no regional wall motion abnormalities. The left ventricular internal cavity size was normal in size. There is  no left ventricular hypertrophy. Right Ventricle: The right ventricular size is normal. No increase in right ventricular wall thickness. Right ventricular systolic function is normal. Left Atrium: Left atrial size was severely dilated. No left atrial/left atrial appendage thrombus was detected. Right Atrium: Right atrial size was mildly dilated. Pericardium: There is no evidence of pericardial effusion. 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 Blunted systolic PV signals , PISA radium 1.0 cm and color flow suggest severe MR ERO 0.34 cm2 and RVolum 48 cc suggest moderate to severe MR. The mitral valve is myxomatous. Severe mitral valve regurgitation. No evidence of mitral valve stenosis. Tricuspid Valve: The tricuspid valve is normal in structure. Tricuspid valve regurgitation is mild . No evidence of tricuspid stenosis. Aortic Valve: The aortic  valve is normal in structure. Aortic valve regurgitation  is not visualized. No aortic stenosis is present. Pulmonic Valve: The pulmonic valve was normal in structure. Pulmonic valve regurgitation is not visualized. No evidence of pulmonic stenosis. Aorta: The aortic root is normal in size and structure. Venous: The inferior vena cava is normal in size with greater than 50% respiratory variability, suggesting right atrial pressure of 3 mmHg. IAS/Shunts: No atrial level shunt detected by color flow Doppler.  MR Peak grad:    129.0 mmHg MR Mean grad:    83.0 mmHg MR Vmax:         568.00 cm/s MR Vmean:        432.0 cm/s MR PISA:         6.28 cm MR PISA Eff ROA: 34 mm MR PISA Radius:  1.00 cm Jenkins Rouge MD Electronically signed by Jenkins Rouge MD Signature Date/Time: 07/29/2021/12:42:57 PM    Final     Cardiac Studies   Echo: Moderate MR with MAD < 8 mm, and bileaflet prolapse, EF is < 60% and new drop from 2021  Patient Profile     65 y.o. 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  We are working on arranging f/u at [facility] - discussed at length with patient and family   For questions or updates, please contact [facility] Please consult [email or web address] for contact info under Cardiology/STEMI.       Signed, Werner Lean, MD  07/30/2021, 9:59 AM

## 2021-07-30 NOTE — Plan of Care (Signed)
  Problem: Health Behavior/Discharge Planning: Goal: Ability to manage health-related needs will improve Outcome: Progressing   Problem: Elimination: Goal: Will not experience complications related to bowel motility Outcome: Progressing Goal: Will not experience complications related to urinary retention Outcome: Progressing   Problem: Pain Managment: Goal: General experience of comfort will improve Outcome: Progressing

## 2021-07-30 NOTE — Progress Notes (Signed)
PROGRESS NOTE    Terry Johns  KGM:[id number] DOB: 1956/03/10 DOA: 07/27/2021 PCP: [name], MD   Brief Narrative:  Terry Johns is a pleasant 65 y.o. male with medical history significant for Ehlers-Danlos, mitral valve prolapse, and PVCs status post ablation in 2018, presented with syncopal episode.  Patient reports recurrent episodes over the past few months involving rapid palpitations and lightheadedness.  He had 3 episodes on 07/25/2021, none the following day, and then 1 on the day of admission that was associated with brief loss of consciousness.  He was preparing to hang some Christmas lights when he had an episode of rapid palpitations and then woke up on the ground. He returned to his usual state very quickly.  There was no tongue biting or incontinence.  Episodes have occurred under different circumstances but none while seated.  He does not feel particularly lightheaded when he stands up.  There was no chest pain associated with these.     Upon arrival to the ED, patient was hemodynamically stable.  EKG features sinus rhythm with sinus arrhythmia and PVCs.  Head CT is negative for acute intracranial abnormality.  Chemistry panel notable for glucose 202 and potassium 5.4.  CBC unremarkable.  Cardiology was consulted by the ED physician and hospitalists asked to admit.  Assessment & Plan:   Principal Problem:   Syncope Active Problems:   Type 2 diabetes mellitus with hyperglycemia (HCC)  1. Syncope, vasovagal?  PVCs/severe MR/?  Mitral valve prolapse: Pt with hx of PVCs s/p ablation in 2018 p/w palpitations and syncope.  EKG shows sinus rhythm with frequent PVCs.  He never had any chest pain or shortness of breath during those episodes.   Has known history of severe MR.  Cardiology on board.  Patient is status post TEE and right and left heart cath 07/29/2021.  Patient has significant MR and per cardiology, should get surgical intervention. given complexity of the valve  patient may be best served at [facility] and we are working to facilitate evaluation.  Cardiology to get CMR for Mitral Annular Disjunction, LVEF, and MR, sedation ordered for the procedure (ativan). Post CMR- which will likely be 07/31/21, patient would be reasonable for discharge.  Cardiology working on arranging f/u at [facility].  2. Type II DM  - A1c was 7.0% in June 2022 and now 7.4.  Blood sugar controlled on 10 units of Lantus and SSI.   3. Hyperkalemia: Resolved.  DVT prophylaxis: enoxaparin (LOVENOX) injection 40 mg Start: 07/27/21 2100   Code Status: Full Code  Family Communication: Wife present at bedside.  Plan of care discussed with patient in length and he verbalized understanding and agreed with it.  Status is: Inpatient  Remains inpatient appropriate because: Needing further cardiac work-up with TEE and right and left heart cath.   Estimated body mass index is 25.48 kg/m as calculated from the following:   Height as of this encounter: 6\' 1"  (1.854 m).   Weight as of this encounter: 87.6 kg.  Nutritional Assessment: Body mass index is 25.48 kg/m.Marland Kitchen Seen by dietician.  I agree with the assessment and plan as outlined below: Nutrition Status:   Skin Assessment: I have examined the patient's skin and I agree with the wound assessment as performed by the wound care RN as outlined below:    Consultants:  Cardiology  Procedures:  None  Antimicrobials:  Anti-infectives (From admission, onward)    None          Subjective:  Seen and examined.  Wife at the bedside.  He states that he still has intermittent dizziness but no other complaint.  Objective: Vitals:   07/29/21 2000 07/29/21 2251 07/30/21 0603 07/30/21 0728  BP:  118/80 122/80 124/88  Pulse:  78 79 70  Resp: 18 18 20 17   Temp:  98.2 F (36.8 C) 97.7 F (36.5 C) (!) 97.5 F (36.4 C)  TempSrc:  Oral Oral Oral  SpO2:  96% 98% 95%  Weight:   87.6 kg   Height:        Intake/Output Summary (Last 24  hours) at 07/30/2021 1251 Last data filed at 07/29/2021 1950 Gross per 24 hour  Intake 1644.57 ml  Output --  Net 1644.57 ml    Filed Weights   07/27/21 2255 07/29/21 0441 07/30/21 0603  Weight: 86 kg 84.7 kg 87.6 kg    Examination:  General exam: Appears calm and comfortable  Respiratory system: Clear to auscultation. Respiratory effort normal. Cardiovascular system: S1 & S2 heard, RRR. No JVD,, rubs, gallops or clicks.  Positive diastolic murmur.  No pedal edema. Gastrointestinal system: Abdomen is nondistended, soft and nontender. No organomegaly or masses felt. Normal bowel sounds heard. Central nervous system: Alert and oriented. No focal neurological deficits. Extremities: Symmetric 5 x 5 power. Skin: No rashes, lesions or ulcers.  Psychiatry: Judgement and insight appear normal. Mood & affect appropriate.    Data Reviewed: I have personally reviewed following labs and imaging studies  CBC: Recent Labs  Lab 07/27/21 1831 07/28/21 0054 07/29/21 0047 07/29/21 1401 07/29/21 1402 07/30/21 0040  WBC 5.9 6.3 5.6  --   --  5.4  NEUTROABS 3.8  --   --   --   --   --   HGB 13.3 14.8 13.7 12.9* 12.2* 13.5  HCT 38.7* 42.8 39.7 38.0* 36.0* 39.1  MCV 86.8 84.9 85.4  --   --  85.0  PLT 241 220 229  --   --  789    Basic Metabolic Panel: Recent Labs  Lab 07/27/21 1831 07/28/21 0054 07/29/21 0047 07/29/21 1401 07/29/21 1402 07/30/21 0040  NA 134* 139 139 140 141 137  K 5.4* 3.8 3.9 4.1 3.8 4.4  CL 105 105 105  --   --  104  CO2 23 26 28   --   --  28  GLUCOSE 202* 94 57*  --   --  96  BUN 16 13 13   --   --  10  CREATININE 0.95 0.84 0.84  --   --  0.90  CALCIUM 9.1 9.1 9.1  --   --  8.8*  MG  --  2.0  --   --   --   --     GFR: Estimated Creatinine Clearance: 91.2 mL/min (by C-G formula based on SCr of 0.9 mg/dL). Liver Function Tests: No results for input(s): AST, ALT, ALKPHOS, BILITOT, PROT, ALBUMIN in the last 168 hours. No results for input(s): LIPASE,  AMYLASE in the last 168 hours. No results for input(s): AMMONIA in the last 168 hours. Coagulation Profile: Recent Labs  Lab 07/29/21 0047  INR 1.0    Cardiac Enzymes: No results for input(s): CKTOTAL, CKMB, CKMBINDEX, TROPONINI in the last 168 hours. BNP (last 3 results) No results for input(s): PROBNP in the last 8760 hours. HbA1C: Recent Labs    07/29/21 1720  HGBA1C 7.4*   CBG: Recent Labs  Lab 07/29/21 0608 07/29/21 1704 07/29/21 2041 07/30/21 0605 07/30/21 1052  GLUCAP 144*  307* 183* 140* 166*    Lipid Profile: No results for input(s): CHOL, HDL, LDLCALC, TRIG, CHOLHDL, LDLDIRECT in the last 72 hours. Thyroid Function Tests: Recent Labs    07/28/21 0054  TSH 2.480    Anemia Panel: No results for input(s): VITAMINB12, FOLATE, FERRITIN, TIBC, IRON, RETICCTPCT in the last 72 hours. Sepsis Labs: No results for input(s): PROCALCITON, LATICACIDVEN in the last 168 hours.  Recent Results (from the past 240 hour(s))  Resp Panel by RT-PCR (Flu A&B, Covid) Nasopharyngeal Swab     Status: None   Collection Time: 07/27/21  8:30 PM   Specimen: Nasopharyngeal Swab; Nasopharyngeal(NP) swabs in vial transport medium  Result Value Ref Range Status   SARS Coronavirus 2 by RT PCR NEGATIVE NEGATIVE Final    Comment: (NOTE) SARS-CoV-2 target nucleic acids are NOT DETECTED.  The SARS-CoV-2 RNA is generally detectable in upper respiratory specimens during the acute phase of infection. The lowest concentration of SARS-CoV-2 viral copies this assay can detect is 138 copies/mL. A negative result does not preclude SARS-Cov-2 infection and should not be used as the sole basis for treatment or other patient management decisions. A negative result may occur with  improper specimen collection/handling, submission of specimen other than nasopharyngeal swab, presence of viral mutation(s) within the areas targeted by this assay, and inadequate number of viral copies(<138 copies/mL).  A negative result must be combined with clinical observations, patient history, and epidemiological information. The expected result is Negative.  Fact Sheet for Patients:  EntrepreneurPulse.com.au  Fact Sheet for Healthcare Providers:  IncredibleEmployment.be  This test is no t yet approved or cleared by the [place] FDA and  has been authorized for detection and/or diagnosis of SARS-CoV-2 by FDA under an Emergency Use Authorization (EUA). This EUA will remain  in effect (meaning this test can be used) for the duration of the COVID-19 declaration under Section 564(b)(1) of the Act, 21 U.S.C.section 360bbb-3(b)(1), unless the authorization is terminated  or revoked sooner.       Influenza A by PCR NEGATIVE NEGATIVE Final   Influenza B by PCR NEGATIVE NEGATIVE Final    Comment: (NOTE) The Xpert Xpress SARS-CoV-2/FLU/RSV plus assay is intended as an aid in the diagnosis of influenza from Nasopharyngeal swab specimens and should not be used as a sole basis for treatment. Nasal washings and aspirates are unacceptable for Xpert Xpress SARS-CoV-2/FLU/RSV testing.  Fact Sheet for Patients: EntrepreneurPulse.com.au  Fact Sheet for Healthcare Providers: IncredibleEmployment.be  This test is not yet approved or cleared by the [place] FDA and has been authorized for detection and/or diagnosis of SARS-CoV-2 by FDA under an Emergency Use Authorization (EUA). This EUA will remain in effect (meaning this test can be used) for the duration of the COVID-19 declaration under Section 564(b)(1) of the Act, 21 U.S.C. section 360bbb-3(b)(1), unless the authorization is terminated or revoked.  Performed at [facility], [facility] [address], [place],  [address]        Radiology Studies: CARDIAC CATHETERIZATION  Result Date: 07/29/2021 No angiographic evidence of CAD Normal LVEDP Recommendations: No  further ischemic workup.   ECHO TEE  Result Date: 07/29/2021    TRANSESOPHOGEAL ECHO REPORT   Patient Name:   Terry Johns Date of Exam: 07/29/2021 Medical Rec #:  [id number]       Height:       73.0 in Accession #:    [id number]      Weight:       186.7 lb Date of  Birth:  1955/09/14       BSA:          2.090 m Patient Age:    38 years        BP:           100/72 mmHg Patient Gender: M               HR:           92 bpm. Exam Location:  Inpatient Procedure: Transesophageal Echo Indications:    mitral valve prolapse  History:        Patient has prior history of Echocardiogram examinations. Mitral                 Valve Disease.  Sonographer:    Merrie Roof RDCS Referring Phys: [place]: The transesophogeal probe was passed without difficulty through the esophogus of the patient. Sedation performed by different physician. The patient's vital signs; including heart rate, blood pressure, and oxygen saturation; remained stable throughout the procedure. The patient developed no complications during the procedure. IMPRESSIONS  1. Left ventricular ejection fraction, by estimation, is 60 to 65%. The left ventricle has normal function. The left ventricle has no regional wall motion abnormalities.  2. Right ventricular systolic function is normal. The right ventricular size is normal.  3. Left atrial size was severely dilated. No left atrial/left atrial appendage thrombus was detected.  4. Right atrial size was mildly dilated.  5. 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 Blunted systolic PV signals , PISA radium 1.0 cm and color flow suggest severe MR ERO 0.34 cm2 and RVolum 48 cc suggest moderate to severe MR . The mitral valve is myxomatous. Severe mitral valve regurgitation. No evidence of mitral stenosis.  6. The aortic valve is normal in structure. Aortic valve regurgitation is not visualized. No aortic stenosis is present.  7. The inferior  vena cava is normal in size with greater than 50% respiratory variability, suggesting right atrial pressure of 3 mmHg. Conclusion(s)/Recommendation(s): Normal biventricular function without evidence of hemodynamically significant valvular heart disease. FINDINGS  Left Ventricle: Left ventricular ejection fraction, by estimation, is 60 to 65%. The left ventricle has normal function. The left ventricle has no regional wall motion abnormalities. The left ventricular internal cavity size was normal in size. There is  no left ventricular hypertrophy. Right Ventricle: The right ventricular size is normal. No increase in right ventricular wall thickness. Right ventricular systolic function is normal. Left Atrium: Left atrial size was severely dilated. No left atrial/left atrial appendage thrombus was detected. Right Atrium: Right atrial size was mildly dilated. Pericardium: There is no evidence of pericardial effusion. 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 Blunted systolic PV signals , PISA radium 1.0 cm and color flow suggest severe MR ERO 0.34 cm2 and RVolum 48 cc suggest moderate to severe MR. The mitral valve is myxomatous. Severe mitral valve regurgitation. No evidence of mitral valve stenosis. Tricuspid Valve: The tricuspid valve is normal in structure. Tricuspid valve regurgitation is mild . No evidence of tricuspid stenosis. Aortic Valve: The aortic valve is normal in structure. Aortic valve regurgitation is not visualized. No aortic stenosis is present. Pulmonic Valve: The pulmonic valve was normal in structure. Pulmonic valve regurgitation is not visualized. No evidence of pulmonic stenosis. Aorta: The aortic root is normal in size and structure. Venous: The inferior vena cava is normal in size with  greater than 50% respiratory variability, suggesting right atrial pressure of 3 mmHg. IAS/Shunts: No atrial level shunt detected by color flow  Doppler.  MR Peak grad:    129.0 mmHg MR Mean grad:    83.0 mmHg MR Vmax:         568.00 cm/s MR Vmean:        432.0 cm/s MR PISA:         6.28 cm MR PISA Eff ROA: 34 mm MR PISA Radius:  1.00 cm Jenkins Rouge MD Electronically signed by Jenkins Rouge MD Signature Date/Time: 07/29/2021/12:42:57 PM    Final     Scheduled Meds:  enoxaparin (LOVENOX) injection  40 mg Subcutaneous Q24H   insulin aspart  0-5 Units Subcutaneous QHS   insulin aspart  0-9 Units Subcutaneous TID WC   insulin glargine-yfgn  10 Units Subcutaneous Daily   LORazepam  1 mg Intravenous Once   sodium chloride flush  3 mL Intravenous Q12H   sodium chloride flush  3 mL Intravenous Q12H   sodium chloride flush  3 mL Intravenous Q12H   sodium chloride flush  3 mL Intravenous Q12H   Continuous Infusions:  sodium chloride     sodium chloride       LOS: 2 days   Time spent: 26 minutes   Darliss Cheney, MD [facility]  07/30/2021, 12:51 PM  Please page via Shea Evans and do not message via secure chat for anything urgent. Secure chat can be used for anything non urgent.  How to contact the [facility] Attending or Consulting provider [place] or covering provider during after hours [place], for this patient?  Check the care team in [facility] and look for a) attending/consulting TR[facility] provider listed and b) the [facility] team listed. Page or secure chat [phone]. Log into [email or web address] and use [facility] universal password to access. If you do not have the password, please contact the hospital operator. Locate the [facility] provider you are looking for under [facility] and page to a number that you can be directly reached. If you still have difficulty reaching the provider, please page the [facility] (Director on Call) for the Hospitalists listed on amion for assistance.

## 2021-07-30 NOTE — Telephone Encounter (Signed)
-----   Message from Werner Lean, MD sent at 07/30/2021  1:30 PM EST ----- Regarding: Complex MR case to [facility] Dr. Cheree Ditto was willing to help [place] get this patient to [facility] (we went through his images this AM).  I'm sending him and his office their info but would you be able to help use get his imaging and documents though to him?  I'd appreciate it  [facility]

## 2021-07-30 NOTE — Care Management Important Message (Signed)
Important Message  Patient Details  Name: Terry Johns MRN: [id number] Date of Birth: 1956/04/16   Medicare Important Message Given:  Yes     Joetta Manners 07/30/2021, 12:05 PM

## 2021-07-31 ENCOUNTER — Telehealth: Payer: Self-pay | Admitting: Cardiology

## 2021-07-31 ENCOUNTER — Inpatient Hospital Stay: Admission: AD | Admit: 2021-07-31 | Payer: Medicare Other | Source: Ambulatory Visit | Admitting: Internal Medicine

## 2021-07-31 ENCOUNTER — Inpatient Hospital Stay (HOSPITAL_COMMUNITY): Payer: Medicare Other

## 2021-07-31 ENCOUNTER — Other Ambulatory Visit (HOSPITAL_COMMUNITY): Payer: Self-pay

## 2021-07-31 ENCOUNTER — Telehealth: Payer: Self-pay | Admitting: Internal Medicine

## 2021-07-31 ENCOUNTER — Encounter (HOSPITAL_COMMUNITY): Payer: Self-pay | Admitting: Cardiovascular Disease

## 2021-07-31 DIAGNOSIS — I340 Nonrheumatic mitral (valve) insufficiency: Secondary | ICD-10-CM

## 2021-07-31 DIAGNOSIS — R55 Syncope and collapse: Secondary | ICD-10-CM | POA: Diagnosis not present

## 2021-07-31 LAB — GLUCOSE, CAPILLARY: Glucose-Capillary: 234 mg/dL — ABNORMAL HIGH (ref 70–99)

## 2021-07-31 MED ORDER — SITAGLIPTIN PHOSPHATE 50 MG PO TABS
50.0000 mg | ORAL_TABLET | Freq: Every day | ORAL | 0 refills | Status: DC
  Filled 2021-07-31: qty 30, 30d supply, fill #0

## 2021-07-31 MED ORDER — GADOBUTROL 1 MMOL/ML IV SOLN
9.0000 mL | Freq: Once | INTRAVENOUS | Status: AC | PRN
  Administered 2021-07-31: 9 mL via INTRAVENOUS

## 2021-07-31 MED ORDER — SITAGLIPTIN PHOSPHATE 50 MG PO TABS: 50.0000 mg | ORAL_TABLET | Freq: Every day | ORAL | 0 refills | Status: DC

## 2021-07-31 NOTE — Discharge Summary (Signed)
Physician Discharge Summary  Terry Johns:[id number] DOB: 05-30-1956 DOA: 07/27/2021  PCP: [name], MD  Admit date: 07/27/2021 Discharge date: 07/31/2021 30 Day Unplanned Readmission Risk Score    Flowsheet Row ED to Hosp-Admission (Current) from 07/27/2021 in [facility] 4E CV SURGICAL PROGRESSIVE CARE  30 Day Unplanned Readmission Risk Score (%) 10.03 Filed at 07/31/2021 0801       This score is the patient's risk of an unplanned readmission within 30 days of being discharged (0 -100%). The score is based on dignosis, age, lab data, medications, orders, and past utilization.   Low:  0-14.9   Medium: 15-21.9   High: 22-29.9   Extreme: 30 and above          Admitted From: Home Disposition: Home  Recommendations for Outpatient Follow-up:  Follow up with PCP in 1-2 weeks Please obtain BMP/CBC in one week Follow-up with your primary cardiologist as per their recommendations Please follow up with your PCP on the following pending results: Unresulted Labs (From admission, onward)     Start     Ordered   08/03/21 0500  Creatinine, serum  (enoxaparin (LOVENOX)    CrCl >/= 30 ml/min)  Weekly,   R     Comments: while on enoxaparin therapy    07/27/21 2051   07/28/21 1201  Hemoglobin A1c  Once,   R        07/28/21 1200              Home Health: None Equipment/Devices: None  Discharge Condition: Stable CODE STATUS: Full code Diet recommendation: Cardiac  Subjective: Seen and examined, he has no complaints.  Wife at the bedside.  He is agreeable with the plan of discharge.  Brief/Interim Summary: [name] is a pleasant 65 y.o. male with medical history significant for Ehlers-Danlos, mitral valve prolapse, and PVCs status post ablation in 2018, presented with syncopal episode.  Patient reports recurrent episodes over the past few months involving rapid palpitations and lightheadedness.  He had 3 episodes on 07/25/2021, none the following day, and then 1 on  the day of admission that was associated with brief loss of consciousness.  There was no tongue biting or incontinence.  Episodes have occurred under different circumstances but none while seated.  He does not feel particularly lightheaded when he stands up.  There was no chest pain associated with these.     Upon arrival to the ED, patient was hemodynamically stable.  EKG features sinus rhythm with sinus arrhythmia and PVCs.  Head CT is negative for acute intracranial abnormality. CBC unremarkable.  Cardiology was consulted by the ED physician and hospitalists asked to admit.   1. Syncope, vasovagal?  PVCs/severe MR/?  Mitral valve prolapse: Pt with hx of PVCs s/p ablation in 2018 p/w palpitations and syncope.  EKG shows sinus rhythm with frequent PVCs.  He never had any chest pain or shortness of breath during those episodes.   Has known history of severe MR.  Cardiology on board.  Patient is status post TEE and right and left heart cath 07/29/2021.  Patient has significant MR and per cardiology, should get surgical intervention. given complexity of the valve patient may be best served at [facility] they we are working to facilitate evaluation.  Cardiology obtained CMR which is completed but results are still pending.  Cardiology working with his primary cardiologist on arranging f/u at [facility].  They have cleared him for discharge.  Etiology of syncope remains unclear, either vasovagal or  secondary to PVC.  Patient is feeling better.  His as needed sildenafil is being discontinued per cardiology recommendations.   2. Type II DM  - A1c was 7.0% in June 2022 and now 7.4.  We had to use 10 units of cynically to control his blood sugar.  He is on metformin which I am continuing and I am also adding 50 mg of Januvia p.o. daily.   3. Hyperkalemia: Resolved.  Discharge Diagnoses:  Principal Problem:   Syncope Active Problems:   Ehlers-Danlos syndrome   PVC (premature ventricular contraction)   Type 2 diabetes  mellitus with hyperglycemia (HCC)   Moderate to severe mitral regurgitation    Discharge Instructions   Allergies as of 07/31/2021   No Known Allergies      Medication List     STOP taking these medications    sildenafil 100 MG tablet Commonly known as: VIAGRA       TAKE these medications    Accu-Chek Guide test strip Generic drug: glucose blood USE AS INSTRUCTED TO CHECK BLOOD SUGARS TWICE WEEKLY.   acetaminophen 325 MG tablet Commonly known as: TYLENOL Take 650 mg by mouth every 6 (six) hours as needed for moderate pain.   fexofenadine 180 MG tablet Commonly known as: ALLEGRA Take 180 mg by mouth daily as needed for allergies or rhinitis.   MEGA MULTI MEN PO Take 1 tablet by mouth daily.   metFORMIN 500 MG tablet Commonly known as: GLUCOPHAGE TAKE 2 TABLETS TWICE DAILY WITH MEALS. What changed:  how much to take how to take this when to take this additional instructions   sitaGLIPtin 50 MG tablet Commonly known as: Januvia Take 1 tablet (50 mg total) by mouth daily.        Follow-up Information     [name], MD Follow up in 1 week(s).   Specialty: Family Medicine Contact information: [place] [address] [phone]         Jerline Pain, MD .   Specialty: Cardiology Contact information: [phone] [address] [place] [address] [phone]         Constance Haw, MD .   Specialty: Cardiology Contact information: [place] [address] [phone]                No Known Allergies  Consultations: Cardiology   Procedures/Studies: CT Head Wo Contrast  Result Date: 07/27/2021 CLINICAL DATA:  Head trauma, syncope EXAM: CT HEAD WITHOUT CONTRAST TECHNIQUE: Contiguous axial images were obtained from the base of the skull through the vertex without intravenous contrast. COMPARISON:  None. FINDINGS: Brain: No evidence of large-territorial  acute infarction. No parenchymal hemorrhage. No mass lesion. No extra-axial collection. No mass effect or midline shift. No hydrocephalus. Basilar cisterns are patent. Vascular: No hyperdense vessel. Skull: No acute fracture or focal lesion. Sinuses/Orbits: Paranasal sinuses and mastoid air cells are clear. The orbits are unremarkable. Other: None. IMPRESSION: No acute intracranial abnormality. Electronically Signed   By: Iven Finn M.D.   On: 07/27/2021 19:25   CARDIAC CATHETERIZATION  Result Date: 07/29/2021 No angiographic evidence of CAD Normal LVEDP Recommendations: No further ischemic workup.   ECHOCARDIOGRAM COMPLETE  Result Date: 07/28/2021    ECHOCARDIOGRAM REPORT   Patient Name:   JVON MERONEY Date of Exam: 07/28/2021 Medical Rec #:  [id number]       Height:       73.0 in Accession #:  [id number]      Weight:       189.6 lb Date of Birth:  29-Dec-1955       BSA:          2.103 m Patient Age:    65 years        BP:           133/86 mmHg Patient Gender: M               HR:           73 bpm. Exam Location:  Inpatient Procedure: 2D Echo Indications:    syncope  History:        Patient has prior history of Echocardiogram examinations, most                 recent 03/15/2020. Ehlers-Danlos syndrome, Mitral Valve Prolapse,                 Arrythmias:PVC; Risk Factors:Diabetes.  Sonographer:    Johny Chess RDCS Referring Phys: [id number] TIMOTHY S OPYD  Sonographer Comments: Image acquisition challenging due to respiratory motion. IMPRESSIONS  1. Left ventricular ejection fraction, by estimation, is 50 to 55%. The left ventricle has low normal function. The left ventricle has no regional wall motion abnormalities. There is mild left ventricular hypertrophy. Left ventricular diastolic parameters are consistent with Grade II diastolic dysfunction (pseudonormalization). Elevated left atrial pressure.  2. Right ventricular systolic function is normal. The right ventricular size is normal. Tricuspid  regurgitation signal is inadequate for assessing PA pressure.  3. Left atrial size was severely dilated.  4. The aortic valve is tricuspid. Aortic valve regurgitation is not visualized. No aortic stenosis is present.  5. The inferior vena cava is normal in size with greater than 50% respiratory variability, suggesting right atrial pressure of 3 mmHg.  6. The mitral valve is abnormal. Bileaflet prolapse. Moderate mitral valve regurgitation. No evidence of mitral stenosis. Mitral regurgitation jet is poorly visualized. Suspect underestimating MR given significant bileaflet prolapse with severely dilated left atrium and elevated E wave velocity. Would recommend TEE or cardiac MRI for further evaluation. FINDINGS  Left Ventricle: Left ventricular ejection fraction, by estimation, is 50 to 55%. The left ventricle has low normal function. The left ventricle has no regional wall motion abnormalities. The left ventricular internal cavity size was normal in size. There is mild left ventricular hypertrophy. Left ventricular diastolic parameters are consistent with Grade II diastolic dysfunction (pseudonormalization). Elevated left atrial pressure. Right Ventricle: The right ventricular size is normal. No increase in right ventricular wall thickness. Right ventricular systolic function is normal. Tricuspid regurgitation signal is inadequate for assessing PA pressure. Left Atrium: Left atrial size was severely dilated. Right Atrium: Right atrial size was normal in size. Pericardium: There is no evidence of pericardial effusion. Mitral Valve: The mitral valve is abnormal. Moderate mitral valve regurgitation. No evidence of mitral valve stenosis. Tricuspid Valve: The tricuspid valve is normal in structure. Tricuspid valve regurgitation is trivial. Aortic Valve: The aortic valve is tricuspid. Aortic valve regurgitation is not visualized. No aortic stenosis is present. Pulmonic Valve: The pulmonic valve was not well visualized.  Pulmonic valve regurgitation is not visualized. Aorta: The aortic root is normal in size and structure. Venous: The inferior vena cava is normal in size with greater than 50% respiratory variability, suggesting right atrial pressure of 3 mmHg. IAS/Shunts: The interatrial septum was not well visualized.  LEFT VENTRICLE PLAX 2D LVIDd:  5.30 cm      Diastology LVIDs:         3.80 cm      LV e' medial:    5.98 cm/s LV PW:         1.30 cm      LV E/e' medial:  20.1 LVOT diam:     2.20 cm      LV e' lateral:   7.07 cm/s LV SV:         60           LV E/e' lateral: 17.0 LV SV Index:   29 LVOT Area:     3.80 cm  LV Volumes (MOD) LV vol d, MOD A2C: 123.0 ml LV vol d, MOD A4C: 104.0 ml LV vol s, MOD A2C: 73.6 ml LV vol s, MOD A4C: 51.3 ml LV SV MOD A2C:     49.4 ml LV SV MOD A4C:     104.0 ml LV SV MOD BP:      52.8 ml RIGHT VENTRICLE             IVC RV S prime:     16.90 cm/s  IVC diam: 1.70 cm TAPSE (M-mode): 2.5 cm LEFT ATRIUM              Index        RIGHT ATRIUM           Index LA diam:        5.10 cm  2.42 cm/m   RA Area:     17.80 cm LA Vol (A2C):   127.0 ml 60.38 ml/m  RA Volume:   48.10 ml  22.87 ml/m LA Vol (A4C):   114.0 ml 54.20 ml/m LA Biplane Vol: 125.0 ml 59.43 ml/m  AORTIC VALVE LVOT Vmax:   91.30 cm/s LVOT Vmean:  55.900 cm/s LVOT VTI:    0.158 m MITRAL VALVE MV Area (PHT): 3.37 cm     SHUNTS MV Decel Time: 225 msec     Systemic VTI:  0.16 m MR Peak grad: 134.1 mmHg    Systemic Diam: 2.20 cm MR Mean grad: 85.0 mmHg MR Vmax:      579.00 cm/s MR Vmean:     431.0 cm/s MV E velocity: 120.00 cm/s MV A velocity: 90.70 cm/s MV E/A ratio:  1.32 Oswaldo Milian MD Electronically signed by Oswaldo Milian MD Signature Date/Time: 07/28/2021/1:24:31 PM    Final    ECHO TEE  Result Date: 07/29/2021    TRANSESOPHOGEAL ECHO REPORT   Patient Name:   JOHNATHEN TESTA [facility] Date of Exam: 07/29/2021 Medical Rec #:  [id number]       Height:       73.0 in Accession #:    [id number]      Weight:       186.7  lb Date of Birth:  [birth year]       BSA:          2.090 m Patient Age:    38 years        BP:           100/72 mmHg Patient Gender: M               HR:           92 bpm. Exam Location:  Inpatient Procedure: Transesophageal Echo Indications:    mitral valve prolapse  History:        Patient has prior history of Echocardiogram examinations. Mitral  Valve Disease.  Sonographer:    Merrie Roof RDCS Referring Phys: [place]: The transesophogeal probe was passed without difficulty through the esophogus of the patient. Sedation performed by different physician. The patient's vital signs; including heart rate, blood pressure, and oxygen saturation; remained stable throughout the procedure. The patient developed no complications during the procedure. IMPRESSIONS  1. Left ventricular ejection fraction, by estimation, is 60 to 65%. The left ventricle has normal function. The left ventricle has no regional wall motion abnormalities.  2. Right ventricular systolic function is normal. The right ventricular size is normal.  3. Left atrial size was severely dilated. No left atrial/left atrial appendage thrombus was detected.  4. Right atrial size was mildly dilated.  5. 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 Blunted systolic PV signals , PISA radium 1.0 cm and color flow suggest severe MR ERO 0.34 cm2 and RVolum 48 cc suggest moderate to severe MR . The mitral valve is myxomatous. Severe mitral valve regurgitation. No evidence of mitral stenosis.  6. The aortic valve is normal in structure. Aortic valve regurgitation is not visualized. No aortic stenosis is present.  7. The inferior vena cava is normal in size with greater than 50% respiratory variability, suggesting right atrial pressure of 3 mmHg. Conclusion(s)/Recommendation(s): Normal biventricular function without evidence of hemodynamically significant valvular heart disease. FINDINGS   Left Ventricle: Left ventricular ejection fraction, by estimation, is 60 to 65%. The left ventricle has normal function. The left ventricle has no regional wall motion abnormalities. The left ventricular internal cavity size was normal in size. There is  no left ventricular hypertrophy. Right Ventricle: The right ventricular size is normal. No increase in right ventricular wall thickness. Right ventricular systolic function is normal. Left Atrium: Left atrial size was severely dilated. No left atrial/left atrial appendage thrombus was detected. Right Atrium: Right atrial size was mildly dilated. Pericardium: There is no evidence of pericardial effusion. 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 Blunted systolic PV signals , PISA radium 1.0 cm and color flow suggest severe MR ERO 0.34 cm2 and RVolum 48 cc suggest moderate to severe MR. The mitral valve is myxomatous. Severe mitral valve regurgitation. No evidence of mitral valve stenosis. Tricuspid Valve: The tricuspid valve is normal in structure. Tricuspid valve regurgitation is mild . No evidence of tricuspid stenosis. Aortic Valve: The aortic valve is normal in structure. Aortic valve regurgitation is not visualized. No aortic stenosis is present. Pulmonic Valve: The pulmonic valve was normal in structure. Pulmonic valve regurgitation is not visualized. No evidence of pulmonic stenosis. Aorta: The aortic root is normal in size and structure. Venous: The inferior vena cava is normal in size with greater than 50% respiratory variability, suggesting right atrial pressure of 3 mmHg. IAS/Shunts: No atrial level shunt detected by color flow Doppler.  MR Peak grad:    129.0 mmHg MR Mean grad:    83.0 mmHg MR Vmax:         568.00 cm/s MR Vmean:        432.0 cm/s MR PISA:         6.28 cm MR PISA Eff ROA: 34 mm MR PISA Radius:  1.00 cm Jenkins Rouge MD Electronically signed by Jenkins Rouge MD Signature  Date/Time: 07/29/2021/12:42:57 PM    Final      Discharge Exam: Vitals:   07/30/21 2319 07/31/21 0830  BP: 118/76 (!) 102/57  Pulse:  76 69  Resp: 20 18  Temp: 97.9 F (36.6 C) 97.6 F (36.4 C)  SpO2: 100% 93%   Vitals:   07/30/21 1710 07/30/21 2049 07/30/21 2319 07/31/21 0830  BP: (!) 134/91 130/75 118/76 (!) 102/57  Pulse: 68 82 76 69  Resp: 18 19 20 18   Temp: 97.8 F (36.6 C) 98.3 F (36.8 C) 97.9 F (36.6 C) 97.6 F (36.4 C)  TempSrc: Oral Oral Oral Oral  SpO2: 96% 96% 100% 93%  Weight:      Height:        General: Pt is alert, awake, not in acute distress Cardiovascular: RRR, S1/S2 +, no rubs, no gallops, positive diastolic murmur. Respiratory: CTA bilaterally, no wheezing, no rhonchi Abdominal: Soft, NT, ND, bowel sounds + Extremities: no edema, no cyanosis    The results of significant diagnostics from this hospitalization (including imaging, microbiology, ancillary and laboratory) are listed below for reference.     Microbiology: Recent Results (from the past 240 hour(s))  Resp Panel by RT-PCR (Flu A&B, Covid) Nasopharyngeal Swab     Status: None   Collection Time: 07/27/21  8:30 PM   Specimen: Nasopharyngeal Swab; Nasopharyngeal(NP) swabs in vial transport medium  Result Value Ref Range Status   SARS Coronavirus 2 by RT PCR NEGATIVE NEGATIVE Final    Comment: (NOTE) SARS-CoV-2 target nucleic acids are NOT DETECTED.  The SARS-CoV-2 RNA is generally detectable in upper respiratory specimens during the acute phase of infection. The lowest concentration of SARS-CoV-2 viral copies this assay can detect is 138 copies/mL. A negative result does not preclude SARS-Cov-2 infection and should not be used as the sole basis for treatment or other patient management decisions. A negative result may occur with  improper specimen collection/handling, submission of specimen other than nasopharyngeal swab, presence of viral mutation(s) within the areas targeted by  this assay, and inadequate number of viral copies(<138 copies/mL). A negative result must be combined with clinical observations, patient history, and epidemiological information. The expected result is Negative.  Fact Sheet for Patients:  EntrepreneurPulse.com.au  Fact Sheet for Healthcare Providers:  IncredibleEmployment.be  This test is no t yet approved or cleared by the [place] FDA and  has been authorized for detection and/or diagnosis of SARS-CoV-2 by FDA under an Emergency Use Authorization (EUA). This EUA will remain  in effect (meaning this test can be used) for the duration of the COVID-19 declaration under Section 564(b)(1) of the Act, 21 U.S.C.section 360bbb-3(b)(1), unless the authorization is terminated  or revoked sooner.       Influenza A by PCR NEGATIVE NEGATIVE Final   Influenza B by PCR NEGATIVE NEGATIVE Final    Comment: (NOTE) The Xpert Xpress SARS-CoV-2/FLU/RSV plus assay is intended as an aid in the diagnosis of influenza from Nasopharyngeal swab specimens and should not be used as a sole basis for treatment. Nasal washings and aspirates are unacceptable for Xpert Xpress SARS-CoV-2/FLU/RSV testing.  Fact Sheet for Patients: EntrepreneurPulse.com.au  Fact Sheet for Healthcare Providers: IncredibleEmployment.be  This test is not yet approved or cleared by the [place] FDA and has been authorized for detection and/or diagnosis of SARS-CoV-2 by FDA under an Emergency Use Authorization (EUA). This EUA will remain in effect (meaning this test can be used) for the duration of the COVID-19 declaration under Section 564(b)(1) of the Act, 21 U.S.C. section 360bbb-3(b)(1), unless the authorization is terminated or revoked.  Performed at [facility], [place] [address] [place], [place] [address]  Labs: BNP (last 3 results) No results for input(s): BNP in the last  8760 hours. Basic Metabolic Panel: Recent Labs  Lab 07/27/21 1831 07/28/21 0054 07/29/21 0047 07/29/21 1401 07/29/21 1402 07/30/21 0040  NA 134* 139 139 140 141 137  K 5.4* 3.8 3.9 4.1 3.8 4.4  CL 105 105 105  --   --  104  CO2 23 26 28   --   --  28  GLUCOSE 202* 94 57*  --   --  96  BUN 16 13 13   --   --  10  CREATININE 0.95 0.84 0.84  --   --  0.90  CALCIUM 9.1 9.1 9.1  --   --  8.8*  MG  --  2.0  --   --   --   --    Liver Function Tests: No results for input(s): AST, ALT, ALKPHOS, BILITOT, PROT, ALBUMIN in the last 168 hours. No results for input(s): LIPASE, AMYLASE in the last 168 hours. No results for input(s): AMMONIA in the last 168 hours. CBC: Recent Labs  Lab 07/27/21 1831 07/28/21 0054 07/29/21 0047 07/29/21 1401 07/29/21 1402 07/30/21 0040  WBC 5.9 6.3 5.6  --   --  5.4  NEUTROABS 3.8  --   --   --   --   --   HGB 13.3 14.8 13.7 12.9* 12.2* 13.5  HCT 38.7* 42.8 39.7 38.0* 36.0* 39.1  MCV 86.8 84.9 85.4  --   --  85.0  PLT 241 220 229  --   --  207   Cardiac Enzymes: No results for input(s): CKTOTAL, CKMB, CKMBINDEX, TROPONINI in the last 168 hours. BNP: Invalid input(s): POCBNP CBG: Recent Labs  Lab 07/29/21 2041 07/30/21 0605 07/30/21 1052 07/30/21 1611 07/30/21 2121  GLUCAP 183* 140* 166* 133* 138*   D-Dimer No results for input(s): DDIMER in the last 72 hours. Hgb A1c Recent Labs    07/29/21 1720  HGBA1C 7.4*   Lipid Profile No results for input(s): CHOL, HDL, LDLCALC, TRIG, CHOLHDL, LDLDIRECT in the last 72 hours. Thyroid function studies No results for input(s): TSH, T4TOTAL, T3FREE, THYROIDAB in the last 72 hours.  Invalid input(s): FREET3 Anemia work up No results for input(s): VITAMINB12, FOLATE, FERRITIN, TIBC, IRON, RETICCTPCT in the last 72 hours. Urinalysis    Component Value Date/Time   COLORURINE YELLOW 01/28/2011 1351   APPEARANCEUR CLEAR 01/28/2011 1351   LABSPEC 1.029 01/28/2011 1351   PHURINE 5.5 01/28/2011 1351    GLUCOSEU NEGATIVE 01/28/2011 1351   HGBUR NEGATIVE 01/28/2011 1351   BILIRUBINUR N 06/08/2017 1502   KETONESUR NEGATIVE 01/28/2011 1351   PROTEINUR N 06/08/2017 1502   PROTEINUR NEGATIVE 01/28/2011 1351   UROBILINOGEN 0.2 06/08/2017 1502   UROBILINOGEN 0.2 01/28/2011 1351   NITRITE N 06/08/2017 1502   NITRITE NEGATIVE 01/28/2011 1351   LEUKOCYTESUR Negative 06/08/2017 1502   Sepsis Labs Invalid input(s): PROCALCITONIN,  WBC,  LACTICIDVEN Microbiology Recent Results (from the past 240 hour(s))  Resp Panel by RT-PCR (Flu A&B, Covid) Nasopharyngeal Swab     Status: None   Collection Time: 07/27/21  8:30 PM   Specimen: Nasopharyngeal Swab; Nasopharyngeal(NP) swabs in vial transport medium  Result Value Ref Range Status   SARS Coronavirus 2 by RT PCR NEGATIVE NEGATIVE Final    Comment: (NOTE) SARS-CoV-2 target nucleic acids are NOT DETECTED.  The SARS-CoV-2 RNA is generally detectable in upper respiratory specimens during the acute phase of infection. The lowest concentration of SARS-CoV-2 viral copies this  assay can detect is 138 copies/mL. A negative result does not preclude SARS-Cov-2 infection and should not be used as the sole basis for treatment or other patient management decisions. A negative result may occur with  improper specimen collection/handling, submission of specimen other than nasopharyngeal swab, presence of viral mutation(s) within the areas targeted by this assay, and inadequate number of viral copies(<138 copies/mL). A negative result must be combined with clinical observations, patient history, and epidemiological information. The expected result is Negative.  Fact Sheet for Patients:  EntrepreneurPulse.com.au  Fact Sheet for Healthcare Providers:  IncredibleEmployment.be  This test is no t yet approved or cleared by the [place] FDA and  has been authorized for detection and/or diagnosis of SARS-CoV-2 by FDA  under an Emergency Use Authorization (EUA). This EUA will remain  in effect (meaning this test can be used) for the duration of the COVID-19 declaration under Section 564(b)(1) of the Act, 21 U.S.C.section 360bbb-3(b)(1), unless the authorization is terminated  or revoked sooner.       Influenza A by PCR NEGATIVE NEGATIVE Final   Influenza B by PCR NEGATIVE NEGATIVE Final    Comment: (NOTE) The Xpert Xpress SARS-CoV-2/FLU/RSV plus assay is intended as an aid in the diagnosis of influenza from Nasopharyngeal swab specimens and should not be used as a sole basis for treatment. Nasal washings and aspirates are unacceptable for Xpert Xpress SARS-CoV-2/FLU/RSV testing.  Fact Sheet for Patients: EntrepreneurPulse.com.au  Fact Sheet for Healthcare Providers: IncredibleEmployment.be  This test is not yet approved or cleared by the [place] FDA and has been authorized for detection and/or diagnosis of SARS-CoV-2 by FDA under an Emergency Use Authorization (EUA). This EUA will remain in effect (meaning this test can be used) for the duration of the COVID-19 declaration under Section 564(b)(1) of the Act, 21 U.S.C. section 360bbb-3(b)(1), unless the authorization is terminated or revoked.  Performed at [facility], [place] [address], [place], [place] [address]      Time coordinating discharge: Over 30 minutes  SIGNED:   Darliss Cheney, MD  [facility] 07/31/2021, 10:40 AM  If 7PM-7AM, please contact night-coverage [email or web address]

## 2021-07-31 NOTE — Telephone Encounter (Signed)
Per Dr Gasper Sells he will direct admit pt.  Bed control notified and pt will be notified once a bed is available.  Pt notified of plan and is agreeable.  Pt advised if he has another syncopal episode 911 will need to be contacted for transport to the ED.  Pt and pt's wife verbalizes understanding and agree with current plan.

## 2021-07-31 NOTE — Progress Notes (Incomplete)
0522 Pt went down for MRI. Pt denies pain. Ativan given to calm pt.

## 2021-07-31 NOTE — Telephone Encounter (Signed)
Orders are signed and held.  Reviewed CMR results with patient.  We had discussed ED evaluation.  Rudean Haskell, MD [place], [address] [place], [place] [address] [phone]  5:39 PM

## 2021-07-31 NOTE — Telephone Encounter (Signed)
Called Patient and family.  Patient notes that thought he was feeling ok when ambulating around room; since he has been back in his house, he has near syncope with minimal exertion.  Has had four episodes of being swimmy-headed with mild exertion.    We have review the case with Dr. Cheree Ditto at [facility].  He is amenable for accepting the patient as a transfer from our facility (via transfer by calling [phone]).    We are attempting to direct admit this patient. There are 30 people in the que for admission.  Clinically, the most important next step is for evaluation and [facility] for surgical management of mitral valve disease.  I am amenable to direct admit him and orders have been placed.  We have discussed that ED assessment is appropriate.  Certainly with any escalation of symptoms or symptoms as rest this would be needed. He and his wife are aware.   If he is to come here he will need:  Telemetry Trial of low dose beta blocker (metoprolol 12.5 mg PO BID) Hold metformin DVT PPX Call above number to update [facility] Transfer portal   Rudean Haskell, MD [place]  [place], [address] [place], [place] [address] [phone]  5:23 PM

## 2021-07-31 NOTE — Progress Notes (Signed)
Progress Note  Patient Name: Terry Johns Date of Encounter: 07/31/2021  Primary Cardiologist: Candee Furbish, MD   Subjective   No events overnight. Had early AM CMR.  Discussed case with Drs. Skains (primary cardiologist), and Gaca (CT surgery- [facility]).  We are working with their team for outpatient surgical evaluation.  Inpatient Medications    Scheduled Meds:  enoxaparin (LOVENOX) injection  40 mg Subcutaneous Q24H   insulin aspart  0-5 Units Subcutaneous QHS   insulin aspart  0-9 Units Subcutaneous TID WC   insulin glargine-yfgn  10 Units Subcutaneous Daily   sodium chloride flush  3 mL Intravenous Q12H   sodium chloride flush  3 mL Intravenous Q12H   Continuous Infusions:  sodium chloride     sodium chloride     PRN Meds: sodium chloride, sodium chloride, acetaminophen **OR** acetaminophen, sodium chloride flush   Vital Signs    Vitals:   07/30/21 1710 07/30/21 2049 07/30/21 2319 07/31/21 0830  BP: (!) 134/91 130/75 118/76 (!) 102/57  Pulse: 68 82 76 69  Resp: 18 19 20 18   Temp: 97.8 F (36.6 C) 98.3 F (36.8 C) 97.9 F (36.6 C) 97.6 F (36.4 C)  TempSrc: Oral Oral Oral Oral  SpO2: 96% 96% 100% 93%  Weight:      Height:       No intake or output data in the 24 hours ending 07/31/21 1020  Filed Weights   07/27/21 2255 07/29/21 0441 07/30/21 0603  Weight: 86 kg 84.7 kg 87.6 kg    Telemetry    Sinus rhythm with improved PVC burden- Personally Reviewed  ECG    No new last 24 - Personally Reviewed  Physical Exam   Gen: no distress, well nourished   Neck: No JVD Cardiac: No Rubs or Gallops, holosystolic IV/VI  murmur, regular rate +2 radial pulses Respiratory: Clear to auscultation bilaterally, normal effort, normal  respiratory rate GI: Soft, nontender, non-distended  MS: No edema;  moves all extremities Integument: Skin feels warm Neuro:  At time of evaluation, alert and oriented to person/place/time/situation  Psych: Normal affect, patient  feels ok   Labs    Chemistry Recent Labs  Lab 07/28/21 0054 07/29/21 0047 07/29/21 1401 07/29/21 1402 07/30/21 0040  NA 139 139 140 141 137  K 3.8 3.9 4.1 3.8 4.4  CL 105 105  --   --  104  CO2 26 28  --   --  28  GLUCOSE 94 57*  --   --  96  BUN 13 13  --   --  10  CREATININE 0.84 0.84  --   --  0.90  CALCIUM 9.1 9.1  --   --  8.8*  GFRNONAA >60 >60  --   --  >60  ANIONGAP 8 6  --   --  5     Hematology Recent Labs  Lab 07/28/21 0054 07/29/21 0047 07/29/21 1401 07/29/21 1402 07/30/21 0040  WBC 6.3 5.6  --   --  5.4  RBC 5.04 4.65  --   --  4.60  HGB 14.8 13.7 12.9* 12.2* 13.5  HCT 42.8 39.7 38.0* 36.0* 39.1  MCV 84.9 85.4  --   --  85.0  MCH 29.4 29.5  --   --  29.3  MCHC 34.6 34.5  --   --  34.5  RDW 12.9 13.0  --   --  12.8  PLT 220 229  --   --  207  Cardiac EnzymesNo results for input(s): TROPONINI in the last 168 hours. No results for input(s): TROPIPOC in the last 168 hours.   BNPNo results for input(s): BNP, PROBNP in the last 168 hours.   DDimer  Recent Labs  Lab 07/28/21 1028  DDIMER <0.27     Radiology    CARDIAC CATHETERIZATION  Result Date: 07/29/2021 No angiographic evidence of CAD Normal LVEDP Recommendations: No further ischemic workup.   ECHO TEE  Result Date: 07/29/2021    TRANSESOPHOGEAL ECHO REPORT   Patient Name:   Terry Johns [facility] Date of Exam: 07/29/2021 Medical Rec #:  [id number]       Height:       73.0 in Accession #:    [id number]      Weight:       186.7 lb Date of Birth:  05/17/1956       BSA:          2.090 m Patient Age:    65 years        BP:           100/72 mmHg Patient Gender: M               HR:           92 bpm. Exam Location:  Inpatient Procedure: Transesophageal Echo Indications:    mitral valve prolapse  History:        Patient has prior history of Echocardiogram examinations. Mitral                 Valve Disease.  Sonographer:    Merrie Roof RDCS Referring Phys: [place]: The  transesophogeal probe was passed without difficulty through the esophogus of the patient. Sedation performed by different physician. The patient's vital signs; including heart rate, blood pressure, and oxygen saturation; remained stable throughout the procedure. The patient developed no complications during the procedure. IMPRESSIONS  1. Left ventricular ejection fraction, by estimation, is 60 to 65%. The left ventricle has normal function. The left ventricle has no regional wall motion abnormalities.  2. Right ventricular systolic function is normal. The right ventricular size is normal.  3. Left atrial size was severely dilated. No left atrial/left atrial appendage thrombus was detected.  4. Right atrial size was mildly dilated.  5. 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 Blunted systolic PV signals , PISA radium 1.0 cm and color flow suggest severe MR ERO 0.34 cm2 and RVolum 48 cc suggest moderate to severe MR . The mitral valve is myxomatous. Severe mitral valve regurgitation. No evidence of mitral stenosis.  6. The aortic valve is normal in structure. Aortic valve regurgitation is not visualized. No aortic stenosis is present.  7. The inferior vena cava is normal in size with greater than 50% respiratory variability, suggesting right atrial pressure of 3 mmHg. Conclusion(s)/Recommendation(s): Normal biventricular function without evidence of hemodynamically significant valvular heart disease. FINDINGS  Left Ventricle: Left ventricular ejection fraction, by estimation, is 60 to 65%. The left ventricle has normal function. The left ventricle has no regional wall motion abnormalities. The left ventricular internal cavity size was normal in size. There is  no left ventricular hypertrophy. Right Ventricle: The right ventricular size is normal. No increase in right ventricular wall thickness. Right ventricular systolic function is normal. Left Atrium: Left atrial  size was severely dilated. No left atrial/left atrial appendage thrombus was detected. Right Atrium: Right atrial size was mildly dilated. Pericardium: There  is no evidence of pericardial effusion. 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 Blunted systolic PV signals , PISA radium 1.0 cm and color flow suggest severe MR ERO 0.34 cm2 and RVolum 48 cc suggest moderate to severe MR. The mitral valve is myxomatous. Severe mitral valve regurgitation. No evidence of mitral valve stenosis. Tricuspid Valve: The tricuspid valve is normal in structure. Tricuspid valve regurgitation is mild . No evidence of tricuspid stenosis. Aortic Valve: The aortic valve is normal in structure. Aortic valve regurgitation is not visualized. No aortic stenosis is present. Pulmonic Valve: The pulmonic valve was normal in structure. Pulmonic valve regurgitation is not visualized. No evidence of pulmonic stenosis. Aorta: The aortic root is normal in size and structure. Venous: The inferior vena cava is normal in size with greater than 50% respiratory variability, suggesting right atrial pressure of 3 mmHg. IAS/Shunts: No atrial level shunt detected by color flow Doppler.  MR Peak grad:    129.0 mmHg MR Mean grad:    83.0 mmHg MR Vmax:         568.00 cm/s MR Vmean:        432.0 cm/s MR PISA:         6.28 cm MR PISA Eff ROA: 34 mm MR PISA Radius:  1.00 cm Jenkins Rouge MD Electronically signed by Jenkins Rouge MD Signature Date/Time: 07/29/2021/12:42:57 PM    Final     Cardiac Studies   Echo: Moderate MR with MAD < 8 mm, and bileaflet prolapse, EF is < 60% and new drop from 2021  Patient Profile     65 y.o. 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   Medication Recommendations:  hold PRN sildenafil Other recommendations (labs, testing, etc):  NA Follow up as an outpatient:  We are arranging follow up both with primary cardiologist and with [facility]  For questions or updates, please contact [place] Please consult [email or web address] for contact info under Cardiology/STEMI.      Signed, Werner Lean, MD  07/31/2021, 10:20 AM

## 2021-07-31 NOTE — Telephone Encounter (Signed)
Pt c/o Syncope: STAT if syncope occurred within 30 minutes and pt complains of lightheadedness High Priority if episode of passing out, completely, today or in last 24 hours   Did you pass out today? yes   When is the last time you passed out? About 15 mins ago   Has this occurred multiple times? Yes 4 times today   Did you have any symptoms prior to passing out? no

## 2021-07-31 NOTE — Telephone Encounter (Signed)
Spoke with pt and pt's wife, DPR who reports pt has 4 pre-syncopal episodes in 45 minutes time since being discharged home today from [facility].  Pt has been able to get to sitting or lying prior to actually fainting.  Denies other symptoms at this time.  Pt's wife states she does not have the capability to check pt's vital signs. Pt advised will forward message to Dr [name] for review and further recommendation. Reviewed ED precautions and pt and wife verbalize understanding and agree with current plan.

## 2021-07-31 NOTE — Telephone Encounter (Signed)
Spoke with pt and advised per Dr [name] pt will need to return to the hospital through the ED as he is obviously not ready for discharge.  Pt verbalizes understanding and agrees wit current plan.  RN also contacted Dr Gasper Sells who is working in the hospital today on Cards service to advise pt has been advised to return to the hospital.

## 2021-07-31 NOTE — Plan of Care (Signed)
  Problem: Education: Goal: Knowledge of General Education information will improve Description: Including pain rating scale, medication(s)/side effects and non-pharmacologic comfort measures Outcome: Adequate for Discharge   Problem: Cardiovascular: Goal: Ability to achieve and maintain adequate cardiovascular perfusion will improve Outcome: Adequate for Discharge Goal: Vascular access site(s) Level 0-1 will be maintained Outcome: Adequate for Discharge   Problem: Activity: Goal: Ability to return to baseline activity level will improve Outcome: Adequate for Discharge   Problem: Education: Goal: Understanding of CV disease, CV risk reduction, and recovery process will improve Outcome: Adequate for Discharge   Problem: Skin Integrity: Goal: Risk for impaired skin integrity will decrease Outcome: Adequate for Discharge

## 2021-08-01 ENCOUNTER — Observation Stay (HOSPITAL_COMMUNITY)
Admission: EM | Admit: 2021-08-01 | Discharge: 2021-08-04 | Disposition: A | Discharge status: Other Acute Inpatient Hospital | Payer: No Typology Code available for payment source | Attending: Internal Medicine | Admitting: Internal Medicine

## 2021-08-01 ENCOUNTER — Other Ambulatory Visit: Payer: Self-pay

## 2021-08-01 ENCOUNTER — Telehealth: Payer: Self-pay | Admitting: Cardiology

## 2021-08-01 ENCOUNTER — Encounter (HOSPITAL_COMMUNITY): Payer: Self-pay | Admitting: Emergency Medicine

## 2021-08-01 ENCOUNTER — Telehealth: Payer: Self-pay

## 2021-08-01 DIAGNOSIS — I340 Nonrheumatic mitral (valve) insufficiency: Secondary | ICD-10-CM | POA: Diagnosis present

## 2021-08-01 DIAGNOSIS — R55 Syncope and collapse: Principal | ICD-10-CM | POA: Insufficient documentation

## 2021-08-01 DIAGNOSIS — I493 Ventricular premature depolarization: Secondary | ICD-10-CM | POA: Diagnosis not present

## 2021-08-01 DIAGNOSIS — E119 Type 2 diabetes mellitus without complications: Secondary | ICD-10-CM | POA: Insufficient documentation

## 2021-08-01 DIAGNOSIS — Z20822 Contact with and (suspected) exposure to covid-19: Secondary | ICD-10-CM | POA: Diagnosis not present

## 2021-08-01 DIAGNOSIS — Z7984 Long term (current) use of oral hypoglycemic drugs: Secondary | ICD-10-CM | POA: Diagnosis not present

## 2021-08-01 DIAGNOSIS — Z87891 Personal history of nicotine dependence: Secondary | ICD-10-CM | POA: Diagnosis not present

## 2021-08-01 LAB — BASIC METABOLIC PANEL
Anion gap: 10 (ref 5–15)
BUN: 15 mg/dL (ref 8–23)
CO2: 26 mmol/L (ref 22–32)
Calcium: 9.3 mg/dL (ref 8.9–10.3)
Chloride: 102 mmol/L (ref 98–111)
Creatinine, Ser: 1 mg/dL (ref 0.61–1.24)
GFR, Estimated: >60 mL/min (ref >60)
Glucose, Bld: 214 mg/dL — ABNORMAL HIGH (ref 70–99)
Potassium: 4.5 mmol/L (ref 3.5–5.1)
Sodium: 138 mmol/L (ref 135–145)

## 2021-08-01 LAB — CBC
HCT: 43.6 % (ref 39.0–52.0)
Hemoglobin: 15 g/dL (ref 13.0–17.0)
MCH: 29.4 pg (ref 26.0–34.0)
MCHC: 34.4 g/dL (ref 30.0–36.0)
MCV: 85.5 fL (ref 80.0–100.0)
Platelets: 233 10*3/uL (ref 150–400)
RBC: 5.1 MIL/uL (ref 4.22–5.81)
RDW: 12.7 % (ref 11.5–15.5)
WBC: 5.5 10*3/uL (ref 4.0–10.5)
nRBC: 0 % (ref 0.0–0.2)

## 2021-08-01 LAB — TROPONIN I (HIGH SENSITIVITY)
Troponin I (High Sensitivity): 5 ng/L (ref <18)
Troponin I (High Sensitivity): 4 ng/L (ref <18)

## 2021-08-01 LAB — RESP PANEL BY RT-PCR (FLU A&B, COVID) ARPGX2
Influenza A by PCR: NEGATIVE
Influenza B by PCR: NEGATIVE
SARS Coronavirus 2 by RT PCR: NEGATIVE

## 2021-08-01 MED ORDER — ACETAMINOPHEN 325 MG PO TABS: 650.0000 mg | ORAL_TABLET | ORAL | Status: DC | PRN

## 2021-08-01 MED ORDER — METOPROLOL TARTRATE 12.5 MG HALF TABLET
12.5000 mg | ORAL_TABLET | Freq: Two times a day (BID) | ORAL | Status: DC
  Filled 2021-08-01 (×4): qty 1

## 2021-08-01 MED ORDER — ONDANSETRON HCL 4 MG/2ML IJ SOLN: 4.0000 mg | Freq: Four times a day (QID) | INTRAMUSCULAR | Status: DC | PRN

## 2021-08-01 MED ORDER — SODIUM CHLORIDE 0.9% FLUSH
3.0000 mL | Freq: Two times a day (BID) | INTRAVENOUS | Status: DC
  Administered 2021-08-02 – 2021-08-03 (×4): 3 mL via INTRAVENOUS

## 2021-08-01 MED ORDER — ENOXAPARIN SODIUM 30 MG/0.3ML IJ SOSY: 30.0000 mg | PREFILLED_SYRINGE | Freq: Two times a day (BID) | INTRAMUSCULAR | Status: DC

## 2021-08-01 MED ORDER — ENOXAPARIN SODIUM 40 MG/0.4ML IJ SOSY
40.0000 mg | PREFILLED_SYRINGE | INTRAMUSCULAR | Status: DC
  Administered 2021-08-02 – 2021-08-03 (×2): 40 mg via SUBCUTANEOUS
  Filled 2021-08-01 (×3): qty 0.4

## 2021-08-01 MED ORDER — SODIUM CHLORIDE 0.9% FLUSH: 3.0000 mL | INTRAVENOUS | Status: DC | PRN

## 2021-08-01 MED ORDER — SODIUM CHLORIDE 0.9 % IV SOLN: 250.0000 mL | INTRAVENOUS | Status: DC | PRN

## 2021-08-01 MED ORDER — INSULIN ASPART 100 UNIT/ML IJ SOLN
0.0000 [IU] | Freq: Three times a day (TID) | INTRAMUSCULAR | Status: DC
  Administered 2021-08-02 – 2021-08-03 (×3): 2 [IU] via SUBCUTANEOUS
  Administered 2021-08-03: 3 [IU] via SUBCUTANEOUS
  Administered 2021-08-03 – 2021-08-04 (×2): 2 [IU] via SUBCUTANEOUS

## 2021-08-01 MED ORDER — ENOXAPARIN SODIUM 30 MG/0.3ML IJ SOSY: 30.0000 mg | PREFILLED_SYRINGE | INTRAMUSCULAR | Status: DC

## 2021-08-01 NOTE — Telephone Encounter (Signed)
Yea just in the hospital

## 2021-08-01 NOTE — Telephone Encounter (Signed)
Patient's wife stating that her husband is waiting to transfer from [facility] to [facility] but there are no beds available and would like to speak with someone. Her phone number is [phone].

## 2021-08-01 NOTE — ED Notes (Signed)
Pt spouse concerned about when pt will be d/c to [facility]. Informed pt he is currently stable with vital signs WNL. He denies any pain or distress. I informed spouse provider is moving as fast as he/she can and will be in shortly.

## 2021-08-01 NOTE — H&P (Signed)
Cardiology Admission History and Physical:   Patient ID: KIMBER ESTERLY MRN: [id number]; DOB: 03/16/1956   Admission date: 08/01/2021  PCP:  [name], MD   [facility] Providers Cardiologist:  Candee Furbish, MD  Electrophysiologist:  Constance Haw, MD       Chief Complaint: Fainting  Patient Profile:   Terry Johns is a 65 y.o. 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  History of Present Illness:   Terry Johns was discharged yesterday with likely vasovagal syncope with worsening LVEF. The plan was to transfer him to [facility] for mitral valve surgery arranged as an outpatient. Patient reports he has had another 4 episode of near-syncope since he was discharged. He managed by lying down without passing out. Patient and his wife spoke with Dr. Oralia Rud office staff and was told to come to ED for further evaluation.  Patient is seen at bedside. His wife and the patient are very frustrated and tell me that they have been waiting since 10am this morning in our ED. Patient requested to be admitted before a transfer to [place] can happen. Currently, patient's vitals stable, lying on bed, no acute distress, on room air. Denies fever, chills, chest discomfort, heart palpitations, dyspnea, nausea, vomiting, bendopnea, orthopnea, dysuria, diarrhea, pedal edema or any bleeding events.    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   EXTERNAL HEMORRHOIDS 05/17/2009   MITRAL VALVE PROLAPSE 05/17/2009    Past Surgical History:  Procedure Laterality Date   COLONOSCOPY  2008   FEMORAL  ARTERY EXPLORATION Right 12/09/2016   Procedure: Evacuation of Hematoma Right Groin, Repair of Right Superfiical Femoral Artery;  Surgeon: Angelia Mould, MD;  Location: [place];  Service: Vascular;  Laterality: Right;   PVC ABLATION  12/08/2016   PVC ABLATION N/A 12/08/2016   Procedure: PVC Ablation;  Surgeon: Will [name], MD;  Location: [place] CV LAB;  Service: Cardiovascular;  Laterality: N/A;   RIGHT/LEFT HEART CATH AND CORONARY ANGIOGRAPHY N/A 07/29/2021   Procedure: RIGHT/LEFT HEART CATH AND CORONARY ANGIOGRAPHY;  Surgeon: Burnell Blanks, MD;  Location: [place] [facility];  Service: Cardiovascular;  Laterality: N/A;   SHOULDER ARTHROSCOPY WITH ROTATOR CUFF REPAIR Left 2017   "[name]"   TEE WITHOUT CARDIOVERSION N/A 07/29/2021   Procedure: TRANSESOPHAGEAL ECHOCARDIOGRAM (TEE);  Surgeon: Josue Hector, MD;  Location: [facility] ENDOSCOPY;  Service: Cardiovascular;  Laterality: N/A;     Medications Prior to Admission: Prior to Admission medications   Medication Sig Start Date End Date Taking? Authorizing Provider  ACCU-CHEK GUIDE test strip USE AS INSTRUCTED TO CHECK BLOOD SUGARS TWICE WEEKLY. 06/04/21   Burchette, Alinda Sierras, MD  acetaminophen (TYLENOL) 325 MG tablet Take 650 mg by mouth every 6 (six) hours as needed for moderate pain.    [provider]  fexofenadine (ALLEGRA) 180 MG tablet Take 180 mg by mouth daily as needed for allergies or rhinitis.    [provider]  metFORMIN (GLUCOPHAGE) 500 MG tablet TAKE 2 TABLETS TWICE DAILY WITH MEALS. Patient taking differently: Take 1,000 mg by mouth 2 (two) times daily with a meal.  02/11/21   Burchette, Alinda Sierras, MD  Multiple Vitamins-Minerals (MEGA MULTI MEN PO) Take 1 tablet by mouth daily.    [provider]  sitaGLIPtin (JANUVIA) 50 MG tablet Take 1 tablet (50 mg total) by mouth daily. 07/31/21 08/30/21  Darliss Cheney, MD     Allergies:   No Known Allergies  Social History:    Social History   Socioeconomic History   Marital status: Married    Spouse name: Not on file   Number of children: Not on file   Years of education: Not on file   Highest education level: Not on file  Occupational History   Not on file  Tobacco Use   Smoking status: Former    Packs/day: 0.10    Years: 3.00    Pack years: 0.30    Types: Cigarettes   Smokeless tobacco: Never  Vaping Use   Vaping Use: Never used  Substance and Sexual Activity   Alcohol use: No   Drug use: No   Sexual activity: Yes  Other Topics Concern   Not on file  Social History Narrative   Not on file   Social Determinants of Health   Financial Resource Strain: Low Risk    Difficulty of Paying Living Expenses: Not hard at all  Food Insecurity: No Food Insecurity   Worried About Charity fundraiser in the Last Year: Never true   [place] in the Last Year: Never true  Transportation Needs: No Transportation Needs   Lack of Transportation (Medical): No   Lack of Transportation (Non-Medical): No  Physical Activity: Insufficiently Active   Days of Exercise per Week: 3 days   Minutes of Exercise per Session: 30 min  Stress: No Stress Concern Present   Feeling of Stress : Not at all  Social Connections: Socially Integrated   Frequency of Communication with Friends and Family: Twice a week   Frequency of Social Gatherings with Friends and Family: Twice a week   Attends Religious Services: More than 4 times per year   Active Member of [employer] or Organizations: Yes   Attends Archivist Meetings: 1 to 4 times per year   Marital Status: Married  Human resources officer Violence: Not At Risk   Fear of Current or Ex-Partner: No   Emotionally Abused: No   Physically Abused: No   Sexually Abused: No    Family History:   The patient's family history includes Breast cancer in his mother; Cancer in his father; Diabetes in his father; Hypertension in his father. There is no history of Colon cancer,  Esophageal cancer, Rectal cancer, Stomach cancer, or Colon polyps.    ROS:  Please see the history of present illness.  All other ROS reviewed and negative.     Physical Exam/Data:   Vitals:   08/01/21 1930 08/01/21 2000 08/01/21 2030 08/01/21 2100  BP: 125/89 121/86 121/75 122/81  Pulse: 69 69 66 72  Resp: 16 16 13 17   Temp:      TempSrc:      SpO2: 97% 95% 96% 95%  Weight:      Height:       No intake or output data in the 24 hours ending 08/01/21 2121 Last 3 Weights 08/01/2021 07/30/2021 07/29/2021  Weight (lbs) 185 lb 193 lb 2 oz 186 lb 11.7 oz  Weight (kg) 83.915 kg 87.6 kg 84.7 kg     Body mass index is 24.41 kg/m.  General:  Well nourished, well developed, in  no acute distress HEENT: normal Neck: no JVD Vascular: No carotid bruits; Distal pulses 2+ bilaterally   Cardiac:  normal S1, S2; RRR; holosystolic IV/VI  murmur Lungs:  clear to auscultation bilaterally, no wheezing, rhonchi or rales  Abd: soft, nontender, no hepatomegaly  Ext: no pitting edema Musculoskeletal:  No deformities, BUE and BLE strength normal and equal Skin: warm and dry  Neuro:  CNs 2-12 intact, no focal abnormalities noted Psych:  Normal affect    EKG:  The ECG that was done  was personally reviewed and demonstrates sinus rhythm with PVC  Relevant CV Studies: TTE 07/28/2021 1. Left ventricular ejection fraction, by estimation, is 50 to 55%. The  left ventricle has low normal function. The left ventricle has no regional  wall motion abnormalities. There is mild left ventricular hypertrophy.  Left ventricular diastolic  parameters are consistent with Grade II diastolic dysfunction  (pseudonormalization). Elevated left atrial pressure.   2. Right ventricular systolic function is normal. The right ventricular  size is normal. Tricuspid regurgitation signal is inadequate for assessing  PA pressure.   3. Left atrial size was severely dilated.   4. The aortic valve is tricuspid. Aortic valve  regurgitation is not  visualized. No aortic stenosis is present.   5. The inferior vena cava is normal in size with greater than 50%  respiratory variability, suggesting right atrial pressure of 3 mmHg.   6. The mitral valve is abnormal. Bileaflet prolapse. Moderate mitral  valve regurgitation. No evidence of mitral stenosis. Mitral regurgitation  jet is poorly visualized. Suspect underestimating MR given significant  bileaflet prolapse with severely  dilated left atrium and elevated E wave velocity. Would recommend TEE or  cardiac MRI for further evaluation.   cMRI  07/31/2021  Decreased LVEF with likely severe mitral regurgitation in the setting of mitral annular disjunction and bi-leaflet prolapse. There is late gadolinium enhancement in the left ventricular myocardium: There is mid mural LGE in the LV inferobasal region under the posterior valve leaflet.     Laboratory Data:  High Sensitivity Troponin:   Recent Labs  Lab 07/28/21 1028 07/28/21 1231 08/01/21 1101 08/01/21 1301  TROPONINIHS 9 6 5 4       Chemistry Recent Labs  Lab 07/28/21 0054 07/29/21 0047 07/30/21 0040 08/01/21 1101  NA 139   < > 137 138  K 3.8   < > 4.4 4.5  CL 105   < > 104 102  CO2 26   < > 28 26  GLUCOSE 94   < > 96 214*  BUN 13   < > 10 15  CREATININE 0.84   < > 0.90 1.00  CALCIUM 9.1   < > 8.8* 9.3  MG 2.0  --   --   --   GFRNONAA >60   < > >60 >60  ANIONGAP 8   < > 5 10   < > = values in this interval not displayed.    No results for input(s): PROT, ALBUMIN, AST, ALT, ALKPHOS, BILITOT in the last 168 hours. Lipids No results for input(s): CHOL, TRIG, HDL, LABVLDL, LDLCALC, CHOLHDL in the last 168 hours. Hematology Recent Labs  Lab 07/30/21 0040 08/01/21 1101  WBC 5.4 5.5  RBC 4.60 5.10  HGB 13.5 15.0  HCT 39.1 43.6  MCV 85.0 85.5  MCH 29.3 29.4  MCHC 34.5 34.4  RDW 12.8 12.7  PLT 207 233   Thyroid  Recent Labs  Lab 07/28/21 0054  TSH 2.480   BNPNo  results for  input(s): BNP, PROBNP in the last 168 hours.  DDimer  Recent Labs  Lab 07/28/21 1028  DDIMER <0.27     Radiology/Studies:  No results found.   Assessment and Plan:   #MVP with Mod-severe MR #Near-syncope and syncope #PVCs with prior ablation -patient returns to our ED requesting admission before a transfer to [facility] for [facility] -HD stable, euvolemic and warm on exam -continue Telemetry -low metoprolol tartrate  -awaiting transfer to [facility]  #T2DM -home on metformin -glucose sliding scale  Risk Assessment/Risk Scores:     Severity of Illness: The appropriate patient status for this patient is OBSERVATION. Observation status is judged to be reasonable and necessary in order to provide the required intensity of service to ensure the patient's safety. The patient's presenting symptoms, physical exam findings, and initial radiographic and laboratory data in the context of their medical condition is felt to place them at decreased risk for further clinical deterioration. Furthermore, it is anticipated that the patient will be medically stable for discharge from the hospital within 2 midnights of admission.    For questions or updates, please contact [place] Please consult [email or web address] for contact info under     Signed, Laurice Record, MD  08/01/2021 9:21 PM

## 2021-08-01 NOTE — Telephone Encounter (Signed)
Patient is still waiting for transfer to [facility].  831 AM: patient was 38th in line for a bed for the the pending admission (in order to facilitate transfer to [place]- Dr. Cheree Ditto) for surgery. Options were reviewed- ([place] ED, [place], Await admission).  BIBEMS to [facility] ED. 11:05:  No PVCs presently (still quiescent similar to at DC) per ED triage.  At that time denied SOB.   11:19 AM: Arrived to [facility].  Unfortunately ED was unable to expedite his care to due high volumes and triage protocols and evaluation. Our team went to discuss with ED.  They are aware of his condition, have no further questions.  They are able to make sure patient is safe.  At this eval, attempted for direct ED to [facility] transfer. 2:41 PM: Confirmation from Dr. Aundra Millet nurse that they have all the updated data 340 PM:  There is now an encounter in [place] for received images at the [facility].  Our team will continue to do all that we can to support this patient in getting his evaluation for surgical management of his valve.    Rudean Haskell, MD [place], [address], [place] [address] [phone]  3:48 PM

## 2021-08-01 NOTE — Telephone Encounter (Signed)
He is not our patient

## 2021-08-01 NOTE — ED Provider Notes (Signed)
[facility] EMERGENCY DEPARTMENT Provider Note   CSN: [id number] Arrival date & time: 08/01/21  1042     History Chief Complaint  Patient presents with   Palpitations    Terry Johns is a 65 y.o. male.  HPI He presents for evaluation of palpitations, periods of near syncope and syncope.  Ongoing symptoms.  Evaluated and hospitalized for same, discharged yesterday.  Since discharge she has had additional episodes of near syncope.  He is taking his usual medicines as prescribed.  He has been working with his cardiologist to be transferred to [facility] for further care and treatment.  He denies current vomiting, fever, cough or weakness.    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   PVC (premature ventricular contraction) 12/08/2016   Ehlers-Danlos syndrome 01/31/2011   Anxiety state 05/28/2010   DYSPHAGIA UNSPECIFIED 05/28/2010   Mitral valve disease 05/17/2009   EXTERNAL HEMORRHOIDS 05/17/2009   ERECTILE DYSFUNCTION, 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    Past Surgical History:  Procedure Laterality Date   COLONOSCOPY  2008   FEMORAL ARTERY EXPLORATION Right 12/09/2016   Procedure: Evacuation of Hematoma Right Groin, Repair of Right Superfiical Femoral Artery;  Surgeon: Angelia Mould, MD;  Location: [place];  Service: Vascular;  Laterality: Right;   PVC ABLATION  12/08/2016   PVC ABLATION N/A 12/08/2016   Procedure:  PVC Ablation;  Surgeon: Will [name], MD;  Location: [place] CV LAB;  Service: Cardiovascular;  Laterality: N/A;   RIGHT/LEFT HEART CATH AND CORONARY ANGIOGRAPHY N/A 07/29/2021   Procedure: RIGHT/LEFT HEART CATH AND CORONARY ANGIOGRAPHY;  Surgeon: Burnell Blanks, MD;  Location: [place] CV LAB;  Service: Cardiovascular;  Laterality: N/A;   SHOULDER ARTHROSCOPY WITH ROTATOR CUFF REPAIR Left 2017   "[name]"   TEE WITHOUT CARDIOVERSION N/A 07/29/2021   Procedure: TRANSESOPHAGEAL ECHOCARDIOGRAM (TEE);  Surgeon: Josue Hector, MD;  Location: [facility] ENDOSCOPY;  Service: Cardiovascular;  Laterality: N/A;       Family History  Problem Relation Age of Onset   Diabetes Father    Hypertension Father    Cancer Father        lung, basal cell, melanoma   Breast cancer Mother    Colon cancer Neg Hx    Esophageal cancer Neg Hx    Rectal cancer Neg Hx    Stomach cancer Neg Hx    Colon polyps Neg Hx     Social History   Tobacco Use   Smoking status: Former    Packs/day: 0.10    Years: 3.00    Pack years: 0.30    Types: Cigarettes   Smokeless tobacco: Never  Vaping Use   Vaping Use: Never used  Substance Use Topics   Alcohol use: No   Drug use: No    Home Medications Prior to Admission medications   Medication Sig Start Date End Date Taking? Authorizing Provider  ACCU-CHEK GUIDE test strip USE AS INSTRUCTED TO CHECK  BLOOD SUGARS TWICE WEEKLY. 06/04/21  Yes Burchette, Alinda Sierras, MD  acetaminophen (TYLENOL) 325 MG tablet Take 650 mg by mouth every 6 (six) hours as needed for moderate pain.   Yes [provider]  fexofenadine (ALLEGRA) 180 MG tablet Take 180 mg by mouth daily as needed for allergies or rhinitis.   Yes [provider]  metFORMIN (GLUCOPHAGE) 500 MG tablet TAKE 2 TABLETS TWICE DAILY WITH MEALS. Patient taking differently: Take 1,000 mg by mouth 2 (two) times daily with a meal. 02/11/21  Yes Burchette, Alinda Sierras, MD  Multiple  Vitamins-Minerals (MEGA MULTI MEN PO) Take 1 tablet by mouth daily.   Yes [provider]  sitaGLIPtin (JANUVIA) 50 MG tablet Take 1 tablet (50 mg total) by mouth daily. 07/31/21 08/30/21 Yes Darliss Cheney, MD    Allergies    Patient has no known allergies.  Review of Systems   Review of Systems  All other systems reviewed and are negative.  Physical Exam Updated Vital Signs BP 138/78 (BP Location: Right Arm)   Pulse 71   Temp 98.1 F (36.7 C) (Oral)   Resp 17   Ht 6\' 1"  (1.854 m)   Wt 83.9 kg   SpO2 95%   BMI 24.41 kg/m   Physical Exam Vitals and nursing note reviewed.  Constitutional:      Appearance: He is well-developed. He is not ill-appearing.  HENT:     Head: Normocephalic and atraumatic.     Right Ear: External ear normal.     Left Ear: External ear normal.  Eyes:     Conjunctiva/sclera: Conjunctivae normal.     Pupils: Pupils are equal, round, and reactive to light.  Neck:     Trachea: Phonation normal.  Cardiovascular:     Rate and Rhythm: Normal rate.  Pulmonary:     Effort: Pulmonary effort is normal.  Abdominal:     General: There is no distension.  Musculoskeletal:        General: Normal range of motion.     Cervical back: Normal range of motion and neck supple.     Right lower leg: No edema.     Left lower leg: No edema.  Skin:    General: Skin is warm and dry.  Neurological:     Mental Status: He is alert and oriented to person, place, and time.     Cranial Nerves: No cranial nerve deficit.     Sensory: No sensory deficit.     Motor: No abnormal muscle tone.     Coordination: Coordination normal.  Psychiatric:        Mood and Affect: Mood normal.        Behavior: Behavior normal.        Thought Content: Thought content normal.        Judgment: Judgment normal.    ED Results / Procedures / Treatments   Labs (all labs ordered are listed, but only abnormal results are displayed) Labs Reviewed  BASIC METABOLIC PANEL - Abnormal;  Notable for the following components:      Result Value   Glucose, Bld 214 (*)    All other components within normal limits  RESP PANEL BY RT-PCR (FLU A&B, COVID) ARPGX2  CBC  CBC  TROPONIN I (HIGH SENSITIVITY)  TROPONIN I (HIGH SENSITIVITY)    EKG None  Radiology MR CARDIAC MORPHOLOGY W WO CONTRAST  Result Date: 07/31/2021 CLINICAL DATA:  Clinical question of Mitral Valve Disease Study assumes HCT of 39 and BSA  of 2.12 m2. EXAM: CARDIAC MRI TECHNIQUE: The patient was scanned on a 1.5 Tesla GE magnet. A dedicated cardiac coil was used. Functional imaging was done using Fiesta sequences. 2,3, and 4 chamber views were done to assess for RWMA's. Modified Simpson's rule using a short axis stack was used to calculate an ejection fraction on a dedicated work Conservation officer, nature. The patient received 9 cc of Gadavist. After 10 minutes inversion recovery sequences were used to assess for infiltration and scar tissue. CONTRAST:  9 cc  of Gadavist FINDINGS: 1. Mildly increased left ventricular size, with LVEDD 66 mm, but LVEDVi 103 mL/m2. Normal left ventricular thickness, with intraventricular septal thickness of 9 mm, posterior wall thickness of 9 mm, and myocardial mass index of 53 g/m2. Mildly decreased left ventricular systolic function (LVEF =09%). There are no regional wall motion abnormalities. Left ventricular parametric mapping notable for normal T2 signal and mild increase in ECV . There is late gadolinium enhancement in the left ventricular myocardium: There is mid mural LGE in the LV inferobasal region under the posterior valve leaflet. 2. Normal right ventricular size with RVEDVI 79 mL/m2. Normal right ventricular thickness. Mild decrease in right ventricular systolic function (RVEF =32%). There are no regional wall motion abnormalities or aneurysms. 3. Normal right atrial size and severe left atrial enlargement; LAESVi 83 mL/m2. 4. Normal size of the aortic root, ascending aorta and  pulmonary artery. 5. Valve assessment: Aortic Valve: Tri-leaflet aortic valve. There is mild, central AI with regurgitant fraction 10 % and mean gradient < 1 mmHg. Pulmonic Valve: Mild central pulmonic insufficiency with regurgitant fraction 14 % and mean gradient < 1 mmHg. Tricuspid Valve: No significant tricuspid valve. 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 There is bileaflet prolapse with posterior prolapse distance of 7 mm, anterior prolapse distance of 2 mm. There is curling distance 4 mm. There is abnormal bilateral abnormal leaflet thickening. Jet is eccentric and anteriorly directed. At most conservative estimate, regurgitant fraction is 37% (moderate to severe). By Pulmonic and aortic flow, regurgitant vole is very severe (> 50%). 6.  Normal pericardium.  No pericardial effusion. 7. Grossly, no extracardiac findings. Recommended dedicated study if concerned for non-cardiac pathology. 8. Breath hold artifacts noted. This decreased the sensitivity of LGE assessment (specifically for assessment of papillary muscle LGE). IMPRESSION: Decreased LVEF with likely severe mitral regurgitation in the setting of mitral annular disjunction and bi-leaflet prolapse. LGE is as above. Rudean Haskell MD Electronically Signed   By: Rudean Haskell M.D.   On: 07/31/2021 16:55    Procedures Procedures   Medications Ordered in ED Medications  ondansetron (ZOFRAN) injection 4 mg (has no administration in time range)  sodium chloride flush (NS) 0.9 % injection 3 mL (3 mLs Intravenous Not Given 08/01/21 2250)  sodium chloride flush (NS) 0.9 % injection 3 mL (has no administration in time range)  0.9 %  sodium chloride infusion (has no administration in time range)  acetaminophen (TYLENOL) tablet 650 mg (has no administration in time range)  metoprolol tartrate (LOPRESSOR) tablet 12.5 mg (0 mg Oral Hold 08/01/21 2250)  enoxaparin (LOVENOX) injection 40  mg (40 mg Subcutaneous Patient Refused/Not Given 08/01/21 2251)  insulin aspart (novoLOG) injection 0-15 Units (has no administration in time range)    ED Course  I have reviewed the triage vital signs and the nursing notes.  Pertinent labs & imaging results that were available during my care of the patient  were reviewed by me and considered in my medical decision making (see chart for details).  Clinical Course as of 08/01/21 2338  Thu Aug 01, 2021  2111 Dr. Alfred Levins, (Fellow) is requesting that the patient be admitted to the hospitalist service and he will arrange for cardiology consultation tomorrow [EW]    Clinical Course User Index [EW] Daleen Bo, MD   MDM Rules/Calculators/A&P                            Patient Vitals for the past 24 hrs:  BP Temp Temp src Pulse Resp SpO2 Height Weight  08/01/21 2218 138/78 98.1 F (36.7 C) Oral 71 17 95 % -- --  08/01/21 2130 126/87 -- -- 69 14 96 % -- --  08/01/21 2100 122/81 -- -- 72 17 95 % -- --  08/01/21 2030 121/75 -- -- 66 13 96 % -- --  08/01/21 2000 121/86 -- -- 69 16 95 % -- --  08/01/21 1930 125/89 -- -- 69 16 97 % -- --  08/01/21 1900 119/86 -- -- 72 19 96 % -- --  08/01/21 1830 117/71 -- -- 75 19 95 % -- --  08/01/21 1800 116/78 -- -- 75 17 95 % -- --  08/01/21 1738 -- -- -- -- -- -- 6\' 1"  (1.854 m) 83.9 kg  08/01/21 1730 122/80 -- -- 80 20 95 % -- --  08/01/21 1700 116/80 -- -- 78 15 96 % -- --  08/01/21 1654 132/73 -- -- 78 19 97 % -- --  08/01/21 1336 116/80 -- -- 74 16 98 % -- --  08/01/21 1046 (!) 145/83 98.1 F (36.7 C) Oral 77 16 98 % -- --     Medical Decision Making:  This patient is presenting for evaluation of recurrent syncope and near syncope, which does require a range of treatment options, and is a complaint that involves a moderate risk of morbidity and mortality. The differential diagnoses include cardiac abnormality, vascular abnormality, metabolic disorder. I decided to review old records, and in  summary elderly male, with complicated history including mitral regurgitation, frequent episodes of syncope and rheumatologic disorder.  I obtained additional historical information from wife at bedside.  Clinical Laboratory Tests Ordered, included CBC, Metabolic panel, and troponin, viral panel . Review indicates normal except glucose high.   Critical Interventions-clinical evaluation, laboratory testing, discussion with cardiology  After These Interventions, the Patient was reevaluated and was found with persistent symptoms requiring observation first stabilization and intervention if needed.  Will admit to this facility, anticipating transfer to [facility] when bed becomes available.  CRITICAL CARE-no Performed by: Daleen Bo  Nursing Notes Reviewed/ Care Coordinated Applicable Imaging Reviewed Interpretation of Laboratory Data incorporated into ED treatment   Plan-admit to cardiology.  I have discussed the case with cardiologist who will admit the patient.    Final Clinical Impression(s) / ED Diagnoses Final diagnoses:  Near syncope  Mitral valve insufficiency, unspecified etiology    Rx / DC Orders ED Discharge Orders     None        Daleen Bo, MD 08/01/21 2338

## 2021-08-01 NOTE — ED Triage Notes (Signed)
Patient BIB [facility] from home for evaluation of palpitations. States palpitations increase with physical exertion. EMS reports frequent PVC's, history of severe mitral regurgitation and states patient called his cardiologist who wanted the patient sent to [facility] for evaluation with goal to transfer him to [facility].

## 2021-08-01 NOTE — Telephone Encounter (Signed)
Pt's wife calling requesting update and advised pt is in the best place he can be for now.  Pt will need to go through the ED process before a transfer to [place] can happen.  Pt's wife verbalizes understanding and thanked Therapist, sports for speaking with her.

## 2021-08-01 NOTE — ED Notes (Signed)
Notified provider about pt Metoprolol dose ordered. Pt stated he doesn't take anything for BP or HR. He just take Metformin for DM2

## 2021-08-01 NOTE — Telephone Encounter (Signed)
Spoke with pt's wife, DPR and advised per Dr Gasper Sells pt should come to [facility] via EMS due to multiple syncopal episodes since discharge yesterday from [facility] so that pt can be transferred to [facility].  Pt's wife verbalizes understanding and agrees with current plan.

## 2021-08-01 NOTE — ED Notes (Signed)
Provided pt bag lunch and water ?

## 2021-08-01 NOTE — ED Provider Notes (Signed)
Emergency Medicine Provider Triage Evaluation Note  Terry Johns , a 65 y.o. male  was evaluated in triage.  Pt complains of palpitations. States he was just admitted here for similar, found to have mitral regurgitation, plan to have open heart surgery for repair at [facility]. States that he returns today because he had "3 PVCs" this morning. Wife requests he "come here for stabilization and then be transferred to [place]." States that he normally has syncopal events when he has these "PVCs" however did not have that this morning. Denies fevers, chills, chest pain, shortness of breath, nausea, vomiting, or diarrhea.  Review of Systems  Positive:  Negative: See above  Physical Exam  BP (!) 145/83 (BP Location: Left Arm)   Pulse 77   Temp 98.1 F (36.7 C) (Oral)   Resp 16   SpO2 98%  Gen:   Awake, no distress   Resp:  Normal effort  MSK:   Moves extremities without difficulty  Other:    Medical Decision Making  Medically screening exam initiated at 11:01 AM.  Appropriate orders placed.  COSTA JHA was informed that the remainder of the evaluation will be completed by another provider, this initial triage assessment does not replace that evaluation, and the importance of remaining in the ED until their evaluation is complete.    Bud Face, PA-C 08/01/21 1105    Lorelle Gibbs, DO 08/02/21 1016

## 2021-08-01 NOTE — Telephone Encounter (Signed)
   Pt's wife is calling back and would like to speak with Rosann Auerbach again, she said they're in the hospital now but they've been waiting in the hallway and no one is helping them there

## 2021-08-01 NOTE — ED Notes (Signed)
Pt spouse inquired about when the pt will be leaving to go to [facility]. Informed spouse we are waiting on the EDP. Once the EDP is available he/she will come to discuss plans. Pt vital signs are WNL

## 2021-08-01 NOTE — ED Notes (Signed)
EDP at the bedside.  ?

## 2021-08-02 DIAGNOSIS — I340 Nonrheumatic mitral (valve) insufficiency: Secondary | ICD-10-CM | POA: Diagnosis not present

## 2021-08-02 DIAGNOSIS — R55 Syncope and collapse: Secondary | ICD-10-CM | POA: Diagnosis not present

## 2021-08-02 LAB — GLUCOSE, CAPILLARY
Glucose-Capillary: 144 mg/dL — ABNORMAL HIGH (ref 70–99)
Glucose-Capillary: 109 mg/dL — ABNORMAL HIGH (ref 70–99)
Glucose-Capillary: 144 mg/dL — ABNORMAL HIGH (ref 70–99)
Glucose-Capillary: 141 mg/dL — ABNORMAL HIGH (ref 70–99)

## 2021-08-02 LAB — CBC
HCT: 39.2 % (ref 39.0–52.0)
Hemoglobin: 13.6 g/dL (ref 13.0–17.0)
MCH: 29.5 pg (ref 26.0–34.0)
MCHC: 34.7 g/dL (ref 30.0–36.0)
MCV: 85 fL (ref 80.0–100.0)
Platelets: 213 10*3/uL (ref 150–400)
RBC: 4.61 MIL/uL (ref 4.22–5.81)
RDW: 12.8 % (ref 11.5–15.5)
WBC: 6.9 10*3/uL (ref 4.0–10.5)
nRBC: 0 % (ref 0.0–0.2)

## 2021-08-02 LAB — CBG MONITORING, ED: Glucose-Capillary: 216 mg/dL — ABNORMAL HIGH (ref 70–99)

## 2021-08-02 NOTE — Care Management CC44 (Signed)
Condition Code 44 Documentation Completed  Patient Details  Name: Terry Johns MRN: [id number] Date of Birth: 28-Apr-1956   Condition Code 44 given:  Yes Patient signature on Condition Code 44 notice:  Yes Documentation of 2 MD's agreement:  Yes Code 44 added to claim:  Yes    Bethena Roys, RN 08/02/2021, 2:20 PM

## 2021-08-02 NOTE — Care Management Obs Status (Signed)
[place] NOTIFICATION   Patient Details  Name: LARY ECKARDT MRN: [id number] Date of Birth: 07/21/1956   Medicare Observation Status Notification Given:  Yes    Bethena Roys, RN 08/02/2021, 2:20 PM

## 2021-08-02 NOTE — Progress Notes (Signed)
Progress Note  Patient Name: Terry Johns Date of Encounter: 08/03/2021  [facility] Cardiologist: Candee Furbish, MD   Subjective   Continues to intermittently feel "swimmy headed." No chest pain or SOB.  Awaiting transfer to [facility]. Remains HD stable with BP 110-120s. HR 60-70s.  Inpatient Medications    Scheduled Meds:  enoxaparin (LOVENOX) injection  40 mg Subcutaneous Q24H   insulin aspart  0-15 Units Subcutaneous TID WC   metoprolol tartrate  12.5 mg Oral BID   sodium chloride flush  3 mL Intravenous Q12H   Continuous Infusions:  sodium chloride     PRN Meds: sodium chloride, acetaminophen, ondansetron (ZOFRAN) IV, sodium chloride flush   Vital Signs    Vitals:   08/02/21 1619 08/02/21 2327 08/03/21 0419 08/03/21 0428  BP: 110/77 118/72 120/74   Pulse: 73 73 76   Resp: 18 17 16    Temp:  98.4 F (36.9 C) 98.2 F (36.8 C)   TempSrc:  Oral Oral   SpO2: 95% 95% 96%   Weight:    83.5 kg  Height:        Intake/Output Summary (Last 24 hours) at 08/03/2021 0604 Last data filed at 08/02/2021 1630 Gross per 24 hour  Intake --  Output 1800 ml  Net -1800 ml   Last 3 Weights 08/03/2021 08/02/2021 08/01/2021  Weight (lbs) 184 lb 187 lb 1.6 oz 185 lb  Weight (kg) 83.462 kg 84.868 kg 83.915 kg      Telemetry    NSR, frequent PVCs - Personally Reviewed  ECG    NSR with PVC - Personally Reviewed  Physical Exam   GEN: No acute distress.   Neck: No JVD Cardiac: RR, 3/6 systolic murmur; frequent PVCs Respiratory: Clear to auscultation bilaterally. GI: Soft, nontender, non-distended  MS: No edema; No deformity. Neuro:  Nonfocal  Psych: Normal affect   Labs    High Sensitivity Troponin:   Recent Labs  Lab 07/28/21 1028 07/28/21 1231 08/01/21 1101 08/01/21 1301  TROPONINIHS 9 6 5 4      Chemistry Recent Labs  Lab 07/28/21 0054 07/29/21 0047 07/29/21 1401 07/29/21 1402 07/30/21 0040 08/01/21 1101  NA 139 139   < > 141 137 138  K 3.8 3.9   < >  3.8 4.4 4.5  CL 105 105  --   --  104 102  CO2 26 28  --   --  28 26  GLUCOSE 94 57*  --   --  96 214*  BUN 13 13  --   --  10 15  CREATININE 0.84 0.84  --   --  0.90 1.00  CALCIUM 9.1 9.1  --   --  8.8* 9.3  MG 2.0  --   --   --   --   --   GFRNONAA >60 >60  --   --  >60 >60  ANIONGAP 8 6  --   --  5 10   < > = values in this interval not displayed.    Lipids No results for input(s): CHOL, TRIG, HDL, LABVLDL, LDLCALC, CHOLHDL in the last 168 hours.  Hematology Recent Labs  Lab 07/30/21 0040 08/01/21 1101 08/02/21 0149  WBC 5.4 5.5 6.9  RBC 4.60 5.10 4.61  HGB 13.5 15.0 13.6  HCT 39.1 43.6 39.2  MCV 85.0 85.5 85.0  MCH 29.3 29.4 29.5  MCHC 34.5 34.4 34.7  RDW 12.8 12.7 12.8  PLT 207 233 213   Thyroid  Recent Labs  Lab 07/28/21 0054  TSH 2.480    BNPNo results for input(s): BNP, PROBNP in the last 168 hours.  DDimer  Recent Labs  Lab 07/28/21 1028  DDIMER <0.27     Radiology    No results found.  Cardiac Studies   LHC 07/29/21: No angiographic evidence of CAD Normal LVEDP   Recommendations: No further ischemic workup.   TEE 07/29/21: IMPRESSIONS   1. Left ventricular ejection fraction, by estimation, is 60 to 65%. The  left ventricle has normal function. The left ventricle has no regional  wall motion abnormalities.   2. Right ventricular systolic function is normal. The right ventricular  size is normal.   3. Left atrial size was severely dilated. No left atrial/left atrial  appendage thrombus was detected.   4. Right atrial size was mildly dilated.   5. 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 Blunted systolic PV signals , PISA radium 1.0 cm  and color flow suggest severe MR ERO 0.34 cm2 and RVolum 48 cc suggest  moderate to severe MR . The mitral  valve is myxomatous. Severe mitral valve regurgitation. No evidence of  mitral stenosis.   6. The aortic valve is normal in structure. Aortic valve  regurgitation is  not visualized. No aortic stenosis is present.   7. The inferior vena cava is normal in size with greater than 50%  respiratory variability, suggesting right atrial pressure of 3 mmHg.   Conclusion(s)/Recommendation(s): Normal biventricular function without  evidence of hemodynamically significant valvular heart disease.   Patient Profile     65 y.o. 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 Was planned for outpatient evaluation at [facility] but represented to the ER with presyncopal symptoms now awaiting transfer to [facility] for MVR. -Awaiting transfer to [place] for MVR -Continue metoprolol 12.5mg  BID -Will add GDMT as able post-surgery  #PVCs s/p ablation: -Continue metop 12.5mg  BID as above   For questions or updates, please contact [place] Please consult [email or web address] for contact info under        Signed, Freada Bergeron, MD  08/03/2021, 6:04 AM

## 2021-08-02 NOTE — Progress Notes (Signed)
Progress Note  Patient Name: Terry Johns Date of Encounter: 08/02/2021  Primary Cardiologist: Candee Furbish, MD   Subjective   Review the difficulties in the admission process due to high patient volumes.  Patient notes that he is presently feeling better.  No Swimmy events or palpitations.  Similar to prior to recent discharge (quiescent on telemetry as well) but has not ambulated much today.  Inpatient Medications    Scheduled Meds:  enoxaparin (LOVENOX) injection  40 mg Subcutaneous Q24H   insulin aspart  0-15 Units Subcutaneous TID WC   metoprolol tartrate  12.5 mg Oral BID   sodium chloride flush  3 mL Intravenous Q12H   Continuous Infusions:  sodium chloride     PRN Meds: sodium chloride, acetaminophen, ondansetron (ZOFRAN) IV, sodium chloride flush   Vital Signs    Vitals:   08/02/21 0100 08/02/21 0128 08/02/21 0541 08/02/21 1000  BP: 115/77 137/86 115/73 (!) 134/95  Pulse: 80 72 78 66  Resp: 16 17 16    Temp: 97.9 F (36.6 C) 98 F (36.7 C)    TempSrc:  Oral Oral   SpO2: 95% 98% 96%   Weight:  84.9 kg    Height:  6\' 1"  (1.854 m)     No intake or output data in the 24 hours ending 08/02/21 1159 Filed Weights   08/01/21 1738 08/02/21 0128  Weight: 83.9 kg 84.9 kg    Telemetry    SR rate PVC - Personally Reviewed  ECG    Sinus Rhythm with rare PVC - Personally Reviewed  Physical Exam   Gen: no distress, tall male Neck: No JVD, no carotid bruit Cardiac: No Rubs or Gallops, Notable systolic murmur IV/IV +2 pulses RRR Respiratory: Clear to auscultation bilaterally,  normal effort, normal  respiratory rate GI: Soft, nontender, non-distended  MS: No  edema;  moves all extremities Integument: Skin feels warm Neuro:  At time of evaluation, alert and oriented to person/place/time/situation  Psych: Normal affect, patient feels very tired   Labs    Chemistry Recent Labs  Lab 07/29/21 0047 07/29/21 1401 07/29/21 1402 07/30/21 0040  08/01/21 1101  NA 139   < > 141 137 138  K 3.9   < > 3.8 4.4 4.5  CL 105  --   --  104 102  CO2 28  --   --  28 26  GLUCOSE 57*  --   --  96 214*  BUN 13  --   --  10 15  CREATININE 0.84  --   --  0.90 1.00  CALCIUM 9.1  --   --  8.8* 9.3  GFRNONAA >60  --   --  >60 >60  ANIONGAP 6  --   --  5 10   < > = values in this interval not displayed.     Hematology Recent Labs  Lab 07/30/21 0040 08/01/21 1101 08/02/21 0149  WBC 5.4 5.5 6.9  RBC 4.60 5.10 4.61  HGB 13.5 15.0 13.6  HCT 39.1 43.6 39.2  MCV 85.0 85.5 85.0  MCH 29.3 29.4 29.5  MCHC 34.5 34.4 34.7  RDW 12.8 12.7 12.8  PLT 207 233 213    Cardiac EnzymesNo results for input(s): TROPONINI in the last 168 hours. No results for input(s): TROPIPOC in the last 168 hours.   BNPNo results for input(s): BNP, PROBNP in the last 168 hours.   DDimer  Recent Labs  Lab 07/28/21 1028  DDIMER <0.27     Radiology  No results found.   Patient Profile     65 y.o. 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 Cheree Ditto - given that he had symptoms immediately returning home planned for inpatient evaluation - prior cardiac imaging sent to [place] with confirmation - we have confirmed with the transfer center; he is pending a bed - metformin held - on low dose BB for PVC suppression - euvolemic - DVT PPX, if patient is able to ambulate around without issue (similar to prior to discharge, may be able to DC (relatively low Padua score)  See prior telephone note for logistics information regarding transfer to [facility] (accepting Dr. Cheree Ditto)   For questions or updates, please contact [facility] Please consult [email or web address] for contact info under Cardiology/STEMI.      Signed, Werner Lean, MD  08/02/2021, 11:59 AM

## 2021-08-02 NOTE — Progress Notes (Signed)
Heart Failure Navigator Progress Note  Assessed for Heart & Vascular TOC clinic readiness.  Patient does not meet criteria due to pt planned transfer to [facility] for evaluation of valve repair by Dr. Cheree Ditto.   Navigator available for reassessment of patient.   Pricilla Holm, MSN, RN Heart Failure Nurse Navigator [phone]

## 2021-08-03 DIAGNOSIS — E119 Type 2 diabetes mellitus without complications: Secondary | ICD-10-CM | POA: Diagnosis not present

## 2021-08-03 DIAGNOSIS — Z20822 Contact with and (suspected) exposure to covid-19: Secondary | ICD-10-CM | POA: Diagnosis not present

## 2021-08-03 DIAGNOSIS — I340 Nonrheumatic mitral (valve) insufficiency: Secondary | ICD-10-CM | POA: Diagnosis not present

## 2021-08-03 DIAGNOSIS — Z87891 Personal history of nicotine dependence: Secondary | ICD-10-CM | POA: Diagnosis not present

## 2021-08-03 DIAGNOSIS — R55 Syncope and collapse: Secondary | ICD-10-CM | POA: Diagnosis not present

## 2021-08-03 LAB — GLUCOSE, CAPILLARY
Glucose-Capillary: 138 mg/dL — ABNORMAL HIGH (ref 70–99)
Glucose-Capillary: 152 mg/dL — ABNORMAL HIGH (ref 70–99)
Glucose-Capillary: 137 mg/dL — ABNORMAL HIGH (ref 70–99)
Glucose-Capillary: 127 mg/dL — ABNORMAL HIGH (ref 70–99)

## 2021-08-03 MED ORDER — METOPROLOL TARTRATE 25 MG PO TABS: 12.5000 mg | ORAL_TABLET | Freq: Two times a day (BID) | ORAL | Status: DC

## 2021-08-03 NOTE — Discharge Summary (Addendum)
Discharge Summary/Transfer    Patient ID: FACUNDO ALLEMAND MRN: [id number]; DOB: 14-Jan-1956  Admit date: 08/01/2021 Discharge date: 08/03/2021  PCP:  [name], MD   [facility] Providers Cardiologist:  Candee Furbish, MD  Electrophysiologist:  Constance Haw, MD  {   Discharge Diagnoses    Principal Problem:   Mitral valve regurgitation Active Problems:   Mitral regurgitation    Diagnostic Studies/Procedures    Echo: 07/28/21  IMPRESSIONS     1. Left ventricular ejection fraction, by estimation, is 50 to 55%. The  left ventricle has low normal function. The left ventricle has no regional  wall motion abnormalities. There is mild left ventricular hypertrophy.  Left ventricular diastolic  parameters are consistent with Grade II diastolic dysfunction  (pseudonormalization). Elevated left atrial pressure.   2. Right ventricular systolic function is normal. The right ventricular  size is normal. Tricuspid regurgitation signal is inadequate for assessing  PA pressure.   3. Left atrial size was severely dilated.   4. The aortic valve is tricuspid. Aortic valve regurgitation is not  visualized. No aortic stenosis is present.   5. The inferior vena cava is normal in size with greater than 50%  respiratory variability, suggesting right atrial pressure of 3 mmHg.   6. The mitral valve is abnormal. Bileaflet prolapse. Moderate mitral  valve regurgitation. No evidence of mitral stenosis. Mitral regurgitation  jet is poorly visualized. Suspect underestimating MR given significant  bileaflet prolapse with severely  dilated left atrium and elevated E wave velocity. Would recommend TEE or  cardiac MRI for further evaluation.   FINDINGS   Left Ventricle: Left ventricular ejection fraction, by estimation, is 50  to 55%. The left ventricle has low normal function. The left ventricle has  no regional wall motion abnormalities. The left ventricular internal  cavity size  was normal in size.  There is mild left ventricular hypertrophy. Left ventricular diastolic  parameters are consistent with Grade II diastolic dysfunction  (pseudonormalization). Elevated left atrial pressure.   Right Ventricle: The right ventricular size is normal. No increase in  right ventricular wall thickness. Right ventricular systolic function is  normal. Tricuspid regurgitation signal is inadequate for assessing PA  pressure.   Left Atrium: Left atrial size was severely dilated.   Right Atrium: Right atrial size was normal in size.   Pericardium: There is no evidence of pericardial effusion.   Mitral Valve: The mitral valve is abnormal. Moderate mitral valve  regurgitation. No evidence of mitral valve stenosis.   Tricuspid Valve: The tricuspid valve is normal in structure. Tricuspid  valve regurgitation is trivial.   Aortic Valve: The aortic valve is tricuspid. Aortic valve regurgitation is  not visualized. No aortic stenosis is present.   Pulmonic Valve: The pulmonic valve was not well visualized. Pulmonic valve  regurgitation is not visualized.   Aorta: The aortic root is normal in size and structure.   Venous: The inferior vena cava is normal in size with greater than 50%  respiratory variability, suggesting right atrial pressure of 3 mmHg.   IAS/Shunts: The interatrial septum was not well visualized.   TEE: 07/29/21  IMPRESSIONS     1. Left ventricular ejection fraction, by estimation, is 60 to 65%. The  left ventricle has normal function. The left ventricle has no regional  wall motion abnormalities.   2. Right ventricular systolic function is normal. The right ventricular  size is normal.   3. Left atrial size was severely dilated. No left atrial/left  atrial  appendage thrombus was detected.   4. Right atrial size was mildly dilated.   5. 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 Blunted systolic PV  signals , PISA radium 1.0 cm  and color flow suggest severe MR ERO 0.34 cm2 and RVolum 48 cc suggest  moderate to severe MR . The mitral  valve is myxomatous. Severe mitral valve regurgitation. No evidence of  mitral stenosis.   6. The aortic valve is normal in structure. Aortic valve regurgitation is  not visualized. No aortic stenosis is present.   7. The inferior vena cava is normal in size with greater than 50%  respiratory variability, suggesting right atrial pressure of 3 mmHg.   Conclusion(s)/Recommendation(s): Normal biventricular function without  evidence of hemodynamically significant valvular heart disease.   FINDINGS   Left Ventricle: Left ventricular ejection fraction, by estimation, is 60  to 65%. The left ventricle has normal function. The left ventricle has no  regional wall motion abnormalities. The left ventricular internal cavity  size was normal in size. There is   no left ventricular hypertrophy.   Right Ventricle: The right ventricular size is normal. No increase in  right ventricular wall thickness. Right ventricular systolic function is  normal.   Left Atrium: Left atrial size was severely dilated. No left atrial/left  atrial appendage thrombus was detected.   Right Atrium: Right atrial size was mildly dilated.   Pericardium: There is no evidence of pericardial effusion.   Mitral Valve: Bi leaflet prolapse with predominant lesion being P1 scallop  with anteriorly directed jet. Blunted systolic PV signals , PISA radium  1.0 cm and color flow suggest severe MR ERO 0.34 cm2 and RVolum 48 cc  suggest moderate to severe MR. The  mitral valve is myxomatous. Severe mitral valve regurgitation. No evidence  of mitral valve stenosis.   Tricuspid Valve: The tricuspid valve is normal in structure. Tricuspid  valve regurgitation is mild . No evidence of tricuspid stenosis.   Aortic Valve: The aortic valve is normal in structure. Aortic valve  regurgitation is not  visualized. No aortic stenosis is present.   Pulmonic Valve: The pulmonic valve was normal in structure. Pulmonic valve  regurgitation is not visualized. No evidence of pulmonic stenosis.   Aorta: The aortic root is normal in size and structure.   Venous: The inferior vena cava is normal in size with greater than 50%  respiratory variability, suggesting right atrial pressure of 3 mmHg.   IAS/Shunts: No atrial level shunt detected by color flow Doppler.   Cath: 07/29/21  No angiographic evidence of CAD Normal LVEDP   Recommendations: No further ischemic workup.  _____________   History of Present Illness     DUNCAN ALEJANDRO is a 65 y.o. 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  EF was noted at 50 to 55%.  He was found to have moderate to severe MR on echo and with recommendations to be evaluated as an outpatient at [facility].  He presented back to the ED 12/1 with near syncope and was readmitted with plans to transfer to [facility]  once bed was available. He was continued on metoprolol 12.5 mg twice daily. Received bed assignment and deemed stable for transfer. EMTALA form completed.   # DM Treated with SSI while inpatient -- on metformin, januvia PTA _____________  Discharge Vitals Blood pressure 126/84, pulse 77, temperature 98.5 F (36.9 C), temperature source Oral, resp. rate 17, height 6\' 1"  (1.854 m), weight 83.5 kg, SpO2 97 %.  Filed Weights   08/01/21 1738 08/02/21 0128 08/03/21 0428  Weight: 83.9 kg 84.9 kg 83.5 kg    Labs & Radiologic Studies    CBC Recent Labs    08/01/21 1101  08/02/21 0149  WBC 5.5 6.9  HGB 15.0 13.6  HCT 43.6 39.2  MCV 85.5 85.0  PLT 233 425   Basic Metabolic Panel Recent Labs    08/01/21 1101  NA 138  K 4.5  CL 102  CO2 26  GLUCOSE 214*  BUN 15  CREATININE 1.00  CALCIUM 9.3   Liver Function Tests No results for input(s): AST, ALT, ALKPHOS, BILITOT, PROT, ALBUMIN in the last 72 hours. No results for input(s): LIPASE, AMYLASE in the last 72 hours. High Sensitivity Troponin:   Recent Labs  Lab 07/28/21 1028 07/28/21 1231 08/01/21 1101 08/01/21 1301  TROPONINIHS 9 6 5 4     BNP Invalid input(s): POCBNP D-Dimer No results for input(s): DDIMER in the last 72 hours. Hemoglobin A1C No results for input(s): HGBA1C in the last 72 hours. Fasting Lipid Panel No results for input(s): CHOL, HDL, LDLCALC, TRIG, CHOLHDL, LDLDIRECT in the last 72 hours. Thyroid Function Tests No results for input(s): TSH, T4TOTAL, T3FREE, THYROIDAB in the last 72 hours.  Invalid input(s): FREET3 _____________  CT Head Wo Contrast  Result Date: 07/27/2021 CLINICAL DATA:  Head trauma, syncope EXAM: CT HEAD WITHOUT CONTRAST TECHNIQUE: Contiguous axial images were obtained from the base of the skull through the vertex without intravenous contrast. COMPARISON:  None. FINDINGS: Brain: No evidence of large-territorial acute infarction. No parenchymal hemorrhage. No mass lesion. No extra-axial collection. No mass effect or midline shift. No hydrocephalus. Basilar cisterns are patent. Vascular: No hyperdense vessel. Skull: No acute fracture or focal lesion. Sinuses/Orbits: Paranasal sinuses and mastoid air cells are clear. The orbits are unremarkable. Other: None. IMPRESSION: No acute intracranial abnormality. Electronically Signed   By: Iven Finn M.D.   On: 07/27/2021 19:25   CARDIAC CATHETERIZATION  Result Date: 07/29/2021 No angiographic evidence of CAD Normal LVEDP Recommendations: No further ischemic workup.   MR CARDIAC MORPHOLOGY W WO  CONTRAST  Result Date: 07/31/2021 CLINICAL DATA:  Clinical question of Mitral Valve Disease Study assumes HCT of 39 and BSA of 2.12 m2. EXAM: CARDIAC MRI TECHNIQUE: The patient was scanned on a 1.5 Tesla GE magnet. A dedicated cardiac coil was used. Functional imaging was done using Fiesta sequences. 2,3, and 4 chamber views were done to assess for RWMA's. Modified Simpson's rule using a short axis stack was used to calculate an ejection fraction on a dedicated work Conservation officer, nature. The patient received 9 cc of Gadavist. After 10 minutes inversion recovery sequences were used to assess for infiltration and scar tissue. CONTRAST:  9 cc  of Gadavist FINDINGS: 1. Mildly increased left ventricular size, with LVEDD 66 mm, but LVEDVi 103 mL/m2. Normal left ventricular thickness, with intraventricular septal thickness of 9 mm, posterior wall thickness of 9 mm, and myocardial mass index of 53 g/m2. Mildly decreased left ventricular systolic function (LVEF =95%). There are no regional wall motion abnormalities. Left  ventricular parametric mapping notable for normal T2 signal and mild increase in ECV . There is late gadolinium enhancement in the left ventricular myocardium: There is mid mural LGE in the LV inferobasal region under the posterior valve leaflet. 2. Normal right ventricular size with RVEDVI 79 mL/m2. Normal right ventricular thickness. Mild decrease in right ventricular systolic function (RVEF =54%). There are no regional wall motion abnormalities or aneurysms. 3. Normal right atrial size and severe left atrial enlargement; LAESVi 83 mL/m2. 4. Normal size of the aortic root, ascending aorta and pulmonary artery. 5. Valve assessment: Aortic Valve: Tri-leaflet aortic valve. There is mild, central AI with regurgitant fraction 10 % and mean gradient < 1 mmHg. Pulmonic Valve: Mild central pulmonic insufficiency with regurgitant fraction 14 % and mean gradient < 1 mmHg. Tricuspid Valve: No significant  tricuspid valve. 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 There is bileaflet prolapse with posterior prolapse distance of 7 mm, anterior prolapse distance of 2 mm. There is curling distance 4 mm. There is abnormal bilateral abnormal leaflet thickening. Jet is eccentric and anteriorly directed. At most conservative estimate, regurgitant fraction is 37% (moderate to severe). By Pulmonic and aortic flow, regurgitant vole is very severe (> 50%). 6.  Normal pericardium.  No pericardial effusion. 7. Grossly, no extracardiac findings. Recommended dedicated study if concerned for non-cardiac pathology. 8. Breath hold artifacts noted. This decreased the sensitivity of LGE assessment (specifically for assessment of papillary muscle LGE). IMPRESSION: Decreased LVEF with likely severe mitral regurgitation in the setting of mitral annular disjunction and bi-leaflet prolapse. LGE is as above. Rudean Haskell MD Electronically Signed   By: Rudean Haskell M.D.   On: 07/31/2021 16:55   ECHOCARDIOGRAM COMPLETE  Result Date: 07/28/2021    ECHOCARDIOGRAM REPORT   Patient Name:   MARIE CHOW Date of Exam: 07/28/2021 Medical Rec #:  [id number]       Height:       73.0 in Accession #:    [id number]      Weight:       189.6 lb Date of Birth:  1955-09-06       BSA:          2.103 m Patient Age:    56 years        BP:           133/86 mmHg Patient Gender: M               HR:           73 bpm. Exam Location:  Inpatient Procedure: 2D Echo Indications:    syncope  History:        Patient has prior history of Echocardiogram examinations, most                 recent 03/15/2020. Ehlers-Danlos syndrome, Mitral Valve Prolapse,                 Arrythmias:PVC; Risk Factors:Diabetes.  Sonographer:    Johny Chess RDCS Referring Phys: [id number] TIMOTHY S OPYD  Sonographer Comments: Image acquisition challenging due to respiratory motion. IMPRESSIONS  1. Left ventricular  ejection fraction, by estimation, is 50 to 55%. The left ventricle has low normal function. The left ventricle has no regional wall motion abnormalities. There is mild left ventricular hypertrophy. Left ventricular diastolic parameters are consistent with Grade II diastolic dysfunction (pseudonormalization). Elevated left atrial pressure.  2. Right ventricular systolic function is normal. The right  ventricular size is normal. Tricuspid regurgitation signal is inadequate for assessing PA pressure.  3. Left atrial size was severely dilated.  4. The aortic valve is tricuspid. Aortic valve regurgitation is not visualized. No aortic stenosis is present.  5. The inferior vena cava is normal in size with greater than 50% respiratory variability, suggesting right atrial pressure of 3 mmHg.  6. The mitral valve is abnormal. Bileaflet prolapse. Moderate mitral valve regurgitation. No evidence of mitral stenosis. Mitral regurgitation jet is poorly visualized. Suspect underestimating MR given significant bileaflet prolapse with severely dilated left atrium and elevated E wave velocity. Would recommend TEE or cardiac MRI for further evaluation. FINDINGS  Left Ventricle: Left ventricular ejection fraction, by estimation, is 50 to 55%. The left ventricle has low normal function. The left ventricle has no regional wall motion abnormalities. The left ventricular internal cavity size was normal in size. There is mild left ventricular hypertrophy. Left ventricular diastolic parameters are consistent with Grade II diastolic dysfunction (pseudonormalization). Elevated left atrial pressure. Right Ventricle: The right ventricular size is normal. No increase in right ventricular wall thickness. Right ventricular systolic function is normal. Tricuspid regurgitation signal is inadequate for assessing PA pressure. Left Atrium: Left atrial size was severely dilated. Right Atrium: Right atrial size was normal in size. Pericardium: There is no  evidence of pericardial effusion. Mitral Valve: The mitral valve is abnormal. Moderate mitral valve regurgitation. No evidence of mitral valve stenosis. Tricuspid Valve: The tricuspid valve is normal in structure. Tricuspid valve regurgitation is trivial. Aortic Valve: The aortic valve is tricuspid. Aortic valve regurgitation is not visualized. No aortic stenosis is present. Pulmonic Valve: The pulmonic valve was not well visualized. Pulmonic valve regurgitation is not visualized. Aorta: The aortic root is normal in size and structure. Venous: The inferior vena cava is normal in size with greater than 50% respiratory variability, suggesting right atrial pressure of 3 mmHg. IAS/Shunts: The interatrial septum was not well visualized.  LEFT VENTRICLE PLAX 2D LVIDd:         5.30 cm      Diastology LVIDs:         3.80 cm      LV e' medial:    5.98 cm/s LV PW:         1.30 cm      LV E/e' medial:  20.1 LVOT diam:     2.20 cm      LV e' lateral:   7.07 cm/s LV SV:         60           LV E/e' lateral: 17.0 LV SV Index:   29 LVOT Area:     3.80 cm  LV Volumes (MOD) LV vol d, MOD A2C: 123.0 ml LV vol d, MOD A4C: 104.0 ml LV vol s, MOD A2C: 73.6 ml LV vol s, MOD A4C: 51.3 ml LV SV MOD A2C:     49.4 ml LV SV MOD A4C:     104.0 ml LV SV MOD BP:      52.8 ml RIGHT VENTRICLE             IVC RV S prime:     16.90 cm/s  IVC diam: 1.70 cm TAPSE (M-mode): 2.5 cm LEFT ATRIUM              Index        RIGHT ATRIUM           Index LA diam:  5.10 cm  2.42 cm/m   RA Area:     17.80 cm LA Vol (A2C):   127.0 ml 60.38 ml/m  RA Volume:   48.10 ml  22.87 ml/m LA Vol (A4C):   114.0 ml 54.20 ml/m LA Biplane Vol: 125.0 ml 59.43 ml/m  AORTIC VALVE LVOT Vmax:   91.30 cm/s LVOT Vmean:  55.900 cm/s LVOT VTI:    0.158 m MITRAL VALVE MV Area (PHT): 3.37 cm     SHUNTS MV Decel Time: 225 msec     Systemic VTI:  0.16 m MR Peak grad: 134.1 mmHg    Systemic Diam: 2.20 cm MR Mean grad: 85.0 mmHg MR Vmax:      579.00 cm/s MR Vmean:     431.0  cm/s MV E velocity: 120.00 cm/s MV A velocity: 90.70 cm/s MV E/A ratio:  1.32 Oswaldo Milian MD Electronically signed by Oswaldo Milian MD Signature Date/Time: 07/28/2021/1:24:31 PM    Final    ECHO TEE  Result Date: 07/29/2021    TRANSESOPHOGEAL ECHO REPORT   Patient Name:   THEOPLIS GARCIAGARCIA [facility] Date of Exam: 07/29/2021 Medical Rec #:  [id number]       Height:       73.0 in Accession #:    [id number]      Weight:       186.7 lb Date of Birth:  August 21, 1956       BSA:          2.090 m Patient Age:    65 years        BP:           100/72 mmHg Patient Gender: M               HR:           92 bpm. Exam Location:  Inpatient Procedure: Transesophageal Echo Indications:    mitral valve prolapse  History:        Patient has prior history of Echocardiogram examinations. Mitral                 Valve Disease.  Sonographer:    Merrie Roof RDCS Referring Phys: [place]: The transesophogeal probe was passed without difficulty through the esophogus of the patient. Sedation performed by different physician. The patient's vital signs; including heart rate, blood pressure, and oxygen saturation; remained stable throughout the procedure. The patient developed no complications during the procedure. IMPRESSIONS  1. Left ventricular ejection fraction, by estimation, is 60 to 65%. The left ventricle has normal function. The left ventricle has no regional wall motion abnormalities.  2. Right ventricular systolic function is normal. The right ventricular size is normal.  3. Left atrial size was severely dilated. No left atrial/left atrial appendage thrombus was detected.  4. Right atrial size was mildly dilated.  5. 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 Blunted systolic PV signals , PISA radium 1.0 cm and color flow suggest severe MR ERO 0.34 cm2 and RVolum 48 cc suggest moderate to severe MR . The mitral valve is myxomatous. Severe mitral valve  regurgitation. No evidence of mitral stenosis.  6. The aortic valve is normal in structure. Aortic valve regurgitation is not visualized. No aortic stenosis is present.  7. The inferior vena cava is normal in size with greater than 50% respiratory variability, suggesting right atrial pressure of 3 mmHg. Conclusion(s)/Recommendation(s): Normal biventricular function without evidence of hemodynamically significant valvular heart disease. FINDINGS  Left Ventricle: Left ventricular ejection fraction, by estimation, is 60 to 65%. The left ventricle has normal function. The left ventricle has no regional wall motion abnormalities. The left ventricular internal cavity size was normal in size. There is  no left ventricular hypertrophy. Right Ventricle: The right ventricular size is normal. No increase in right ventricular wall thickness. Right ventricular systolic function is normal. Left Atrium: Left atrial size was severely dilated. No left atrial/left atrial appendage thrombus was detected. Right Atrium: Right atrial size was mildly dilated. Pericardium: There is no evidence of pericardial effusion. 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 Blunted systolic PV signals , PISA radium 1.0 cm and color flow suggest severe MR ERO 0.34 cm2 and RVolum 48 cc suggest moderate to severe MR. The mitral valve is myxomatous. Severe mitral valve regurgitation. No evidence of mitral valve stenosis. Tricuspid Valve: The tricuspid valve is normal in structure. Tricuspid valve regurgitation is mild . No evidence of tricuspid stenosis. Aortic Valve: The aortic valve is normal in structure. Aortic valve regurgitation is not visualized. No aortic stenosis is present. Pulmonic Valve: The pulmonic valve was normal in structure. Pulmonic valve regurgitation is not visualized. No evidence of pulmonic stenosis. Aorta: The aortic root is normal in size and structure. Venous: The  inferior vena cava is normal in size with greater than 50% respiratory variability, suggesting right atrial pressure of 3 mmHg. IAS/Shunts: No atrial level shunt detected by color flow Doppler.  MR Peak grad:    129.0 mmHg MR Mean grad:    83.0 mmHg MR Vmax:         568.00 cm/s MR Vmean:        432.0 cm/s MR PISA:         6.28 cm MR PISA Eff ROA: 34 mm MR PISA Radius:  1.00 cm Jenkins Rouge MD Electronically signed by Jenkins Rouge MD Signature Date/Time: 07/29/2021/12:42:57 PM    Final    Disposition   Pt is being discharged home today in good condition.  Follow-up Plans & Appointments     Discharge Instructions     Diet - low sodium heart healthy   Complete by: As directed    Increase activity slowly   Complete by: As directed        Discharge Medications   Allergies as of 08/03/2021   No Known Allergies      Medication List     TAKE these medications    Accu-Chek Guide test strip Generic drug: glucose blood USE AS INSTRUCTED TO CHECK BLOOD SUGARS TWICE WEEKLY.   acetaminophen 325 MG tablet Commonly known as: TYLENOL Take 650 mg by mouth every 6 (six) hours as needed for moderate pain.   fexofenadine 180 MG tablet Commonly known as: ALLEGRA Take 180 mg by mouth daily as needed for allergies or rhinitis.   MEGA MULTI MEN PO Take 1 tablet by mouth daily.   metFORMIN 500 MG tablet Commonly known as: GLUCOPHAGE TAKE 2 TABLETS TWICE DAILY WITH MEALS. What changed:  how much to take how to take this when to take this additional instructions   metoprolol tartrate 25 MG tablet Commonly known as: LOPRESSOR Take 0.5 tablets (12.5 mg total) by mouth 2 (two) times daily.   sitaGLIPtin 50 MG tablet Commonly known as: Januvia Take 1 tablet (50 mg total) by mouth daily.         Outstanding Labs/Studies   N/a   Duration of Discharge Encounter  Greater than 30 minutes including physician time.  Signed, Reino Bellis, NP 08/03/2021, 5:50 PM   Patient  seen and examined and agree with Reino Bellis, NP as detailed above. Transfer to [place] today for MVR.  Rest of plan per progress note today  Gwyndolyn Kaufman, MD

## 2021-08-03 NOTE — Plan of Care (Signed)
  Problem: Education: Goal: Knowledge of General Education information will improve Description: Including pain rating scale, medication(s)/side effects and non-pharmacologic comfort measures Outcome: Progressing   Problem: Health Behavior/Discharge Planning: Goal: Ability to manage health-related needs will improve Outcome: Progressing   Problem: Clinical Measurements: Goal: Ability to maintain clinical measurements within normal limits will improve Outcome: Progressing   Problem: Clinical Measurements: Goal: Cardiovascular complication will be avoided Outcome: Progressing   Problem: Safety: Goal: Ability to remain free from injury will improve Outcome: Progressing

## 2021-08-03 NOTE — Progress Notes (Signed)
Spoke to [facility] for updates. No definite time for pick up and staff advised the RN to try [facility]. [facility] is also fully booked. Informed [facility] transport staff, CJ, that care link is fully booked and to keep the patient on their list. Informed patient's wife.

## 2021-08-04 DIAGNOSIS — R55 Syncope and collapse: Secondary | ICD-10-CM | POA: Diagnosis not present

## 2021-08-04 LAB — GLUCOSE, CAPILLARY: Glucose-Capillary: 140 mg/dL — ABNORMAL HIGH (ref 70–99)

## 2021-08-04 NOTE — Progress Notes (Signed)
Awaiting transfer to [facility]. Doing well this AM. HD stable.

## 2021-08-05 ENCOUNTER — Other Ambulatory Visit: Payer: Self-pay | Admitting: Family Medicine

## 2021-08-05 DIAGNOSIS — R55 Syncope and collapse: Secondary | ICD-10-CM | POA: Diagnosis present

## 2021-08-05 DIAGNOSIS — E119 Type 2 diabetes mellitus without complications: Secondary | ICD-10-CM | POA: Diagnosis not present

## 2021-08-05 DIAGNOSIS — Z87891 Personal history of nicotine dependence: Secondary | ICD-10-CM | POA: Diagnosis not present

## 2021-08-05 DIAGNOSIS — Z20822 Contact with and (suspected) exposure to covid-19: Secondary | ICD-10-CM | POA: Diagnosis not present

## 2021-08-05 DIAGNOSIS — Z7984 Long term (current) use of oral hypoglycemic drugs: Secondary | ICD-10-CM | POA: Diagnosis not present

## 2021-08-05 DIAGNOSIS — I340 Nonrheumatic mitral (valve) insufficiency: Secondary | ICD-10-CM | POA: Diagnosis not present

## 2021-08-06 ENCOUNTER — Ambulatory Visit: Payer: Medicare Other | Admitting: Physician Assistant

## 2021-08-14 ENCOUNTER — Ambulatory Visit: Payer: Medicare Other | Admitting: Family Medicine

## 2021-08-25 ENCOUNTER — Emergency Department (HOSPITAL_COMMUNITY)
Admission: EM | Admit: 2021-08-25 | Discharge: 2021-08-25 | Disposition: A | Discharge status: Home / Self Care | Payer: Medicare Other | Attending: Student | Admitting: Student

## 2021-08-25 ENCOUNTER — Encounter (HOSPITAL_COMMUNITY): Payer: Self-pay | Admitting: Emergency Medicine

## 2021-08-25 ENCOUNTER — Other Ambulatory Visit: Payer: Self-pay

## 2021-08-25 DIAGNOSIS — E119 Type 2 diabetes mellitus without complications: Secondary | ICD-10-CM | POA: Diagnosis not present

## 2021-08-25 DIAGNOSIS — I4892 Unspecified atrial flutter: Secondary | ICD-10-CM | POA: Diagnosis not present

## 2021-08-25 DIAGNOSIS — Z7984 Long term (current) use of oral hypoglycemic drugs: Secondary | ICD-10-CM | POA: Diagnosis not present

## 2021-08-25 DIAGNOSIS — R55 Syncope and collapse: Secondary | ICD-10-CM | POA: Diagnosis not present

## 2021-08-25 DIAGNOSIS — Z955 Presence of coronary angioplasty implant and graft: Secondary | ICD-10-CM | POA: Insufficient documentation

## 2021-08-25 DIAGNOSIS — Z87891 Personal history of nicotine dependence: Secondary | ICD-10-CM | POA: Diagnosis not present

## 2021-08-25 DIAGNOSIS — Z7901 Long term (current) use of anticoagulants: Secondary | ICD-10-CM | POA: Insufficient documentation

## 2021-08-25 DIAGNOSIS — I483 Typical atrial flutter: Secondary | ICD-10-CM

## 2021-08-25 LAB — CBC WITH DIFFERENTIAL/PLATELET
Abs Immature Granulocytes: 0.03 K/uL (ref 0.00–0.07)
Basophils Absolute: 0.2 K/uL — ABNORMAL HIGH (ref 0.0–0.1)
Basophils Relative: 2 %
Eosinophils Absolute: 0.6 K/uL — ABNORMAL HIGH (ref 0.0–0.5)
Eosinophils Relative: 7 %
HCT: 35.5 % — ABNORMAL LOW (ref 39.0–52.0)
Hemoglobin: 11.6 g/dL — ABNORMAL LOW (ref 13.0–17.0)
Immature Granulocytes: 0 %
Lymphocytes Relative: 17 %
Lymphs Abs: 1.5 K/uL (ref 0.7–4.0)
MCH: 29.3 pg (ref 26.0–34.0)
MCHC: 32.7 g/dL (ref 30.0–36.0)
MCV: 89.6 fL (ref 80.0–100.0)
Monocytes Absolute: 0.7 K/uL (ref 0.1–1.0)
Monocytes Relative: 8 %
Neutro Abs: 5.8 K/uL (ref 1.7–7.7)
Neutrophils Relative %: 66 %
Platelets: 507 K/uL — ABNORMAL HIGH (ref 150–400)
RBC: 3.96 MIL/uL — ABNORMAL LOW (ref 4.22–5.81)
RDW: 14.9 % (ref 11.5–15.5)
WBC: 8.8 K/uL (ref 4.0–10.5)
nRBC: 0 % (ref 0.0–0.2)

## 2021-08-25 LAB — COMPREHENSIVE METABOLIC PANEL
ALT: 12 U/L (ref 0–44)
AST: 17 U/L (ref 15–41)
Albumin: 3.4 g/dL — ABNORMAL LOW (ref 3.5–5.0)
Alkaline Phosphatase: 62 U/L (ref 38–126)
Anion gap: 12 (ref 5–15)
BUN: 20 mg/dL (ref 8–23)
CO2: 25 mmol/L (ref 22–32)
Calcium: 9.2 mg/dL (ref 8.9–10.3)
Chloride: 96 mmol/L — ABNORMAL LOW (ref 98–111)
Creatinine, Ser: 0.82 mg/dL (ref 0.61–1.24)
GFR, Estimated: >60 mL/min (ref >60)
Glucose, Bld: 151 mg/dL — ABNORMAL HIGH (ref 70–99)
Potassium: 3.9 mmol/L (ref 3.5–5.1)
Sodium: 133 mmol/L — ABNORMAL LOW (ref 135–145)
Total Bilirubin: 0.8 mg/dL (ref 0.3–1.2)
Total Protein: 7.1 g/dL (ref 6.5–8.1)

## 2021-08-25 LAB — I-STAT CHEM 8, ED
BUN: 26 mg/dL — ABNORMAL HIGH (ref 8–23)
Calcium, Ion: 1.1 mmol/L — ABNORMAL LOW (ref 1.15–1.40)
Chloride: 97 mmol/L — ABNORMAL LOW (ref 98–111)
Creatinine, Ser: 0.7 mg/dL (ref 0.61–1.24)
Glucose, Bld: 146 mg/dL — ABNORMAL HIGH (ref 70–99)
HCT: 37 % — ABNORMAL LOW (ref 39.0–52.0)
Hemoglobin: 12.6 g/dL — ABNORMAL LOW (ref 13.0–17.0)
Potassium: 4.1 mmol/L (ref 3.5–5.1)
Sodium: 134 mmol/L — ABNORMAL LOW (ref 135–145)
TCO2: 29 mmol/L (ref 22–32)

## 2021-08-25 LAB — TROPONIN I (HIGH SENSITIVITY): Troponin I (High Sensitivity): 42 ng/L — ABNORMAL HIGH (ref <18)

## 2021-08-25 MED ORDER — ETOMIDATE 2 MG/ML IV SOLN
INTRAVENOUS | Status: AC | PRN
  Administered 2021-08-25: 2 mg via INTRAVENOUS
  Administered 2021-08-25: 6 mg via INTRAVENOUS

## 2021-08-25 MED ORDER — APIXABAN 5 MG PO TABS
5.0000 mg | ORAL_TABLET | Freq: Two times a day (BID) | ORAL | Status: DC
  Administered 2021-08-25: 19:00:00 5 mg via ORAL
  Filled 2021-08-25: qty 1

## 2021-08-25 MED ORDER — APIXABAN 5 MG PO TABS: 5.0000 mg | ORAL_TABLET | Freq: Two times a day (BID) | ORAL | 0 refills | Status: DC

## 2021-08-25 MED ORDER — METOPROLOL TARTRATE 5 MG/5ML IV SOLN: 5.0000 mg | Freq: Once | INTRAVENOUS | Status: DC

## 2021-08-25 MED ORDER — MAGNESIUM OXIDE -MG SUPPLEMENT 400 (240 MG) MG PO TABS
800.0000 mg | ORAL_TABLET | Freq: Once | ORAL | Status: AC
  Administered 2021-08-25: 19:00:00 800 mg via ORAL
  Filled 2021-08-25: qty 2

## 2021-08-25 MED ORDER — LACTATED RINGERS IV BOLUS
1000.0000 mL | Freq: Once | INTRAVENOUS | Status: AC
  Administered 2021-08-25: 18:00:00 1000 mL via INTRAVENOUS

## 2021-08-25 NOTE — ED Notes (Signed)
Pt verbalizes understanding of discharge instructions. Opportunity for questions and answers were provided. Pt discharged from the ED.   ?

## 2021-08-25 NOTE — ED Provider Notes (Signed)
[facility] EMERGENCY DEPARTMENT Provider Note   CSN: [id number] Arrival date & time: 08/25/21  1637     History Chief Complaint  Patient presents with   Loss of Consciousness   Tachycardia    Terry Johns is a 65 y.o. male with PMH T2DM, Erler Danlos syndrome, a flutter status post ablation, recent admission and transfer to [facility] for mitral valve prolapse and valvuloplasty who presents the emergency department for evaluation of syncope and rapid heart rate.  Patient states that today he had an episode of where he passed out and EMS apparently found the patient to have runs of ventricular tachycardia when attempting to move the patient.  He was given amiodarone in the field and currently arrives in a flutter 2-1.  EMS has provided multiple rhythm strips but none of these rhythms show V. Tach, but instead show ventricular bigeminy and atrial flutter.  Patient is currently not anticoagulated and his symptoms from a fluttering standpoint began at 3 AM this morning.  Currently denies chest pain, abdominal pain, nausea, vomiting, shortness of breath or other systemic symptoms.   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   EXTERNAL HEMORRHOIDS 05/17/2009   MITRAL VALVE PROLAPSE 05/17/2009    Patient Active Problem List   Diagnosis Date Noted   Mitral valve regurgitation 08/01/2021   Mitral regurgitation 08/01/2021   Moderate to severe mitral regurgitation 07/31/2021   Syncope 07/27/2021   Type 2 diabetes mellitus with hyperglycemia ([place]) 10/28/2017   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   PVC (premature ventricular  contraction) 12/08/2016   Ehlers-Danlos syndrome 01/31/2011   Anxiety state 05/28/2010   DYSPHAGIA UNSPECIFIED 05/28/2010   Mitral valve disease 05/17/2009   EXTERNAL HEMORRHOIDS 05/17/2009   ERECTILE DYSFUNCTION, 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    Past Surgical History:  Procedure Laterality Date   COLONOSCOPY  2008   FEMORAL ARTERY EXPLORATION Right 12/09/2016   Procedure: Evacuation of Hematoma Right Groin, Repair of Right Superfiical Femoral Artery;  Surgeon: Angelia Mould, MD;  Location: [facility];  Service: Vascular;  Laterality: Right;   PVC ABLATION  12/08/2016   PVC ABLATION N/A 12/08/2016   Procedure: PVC Ablation;  Surgeon: Will [name], MD;  Location: [facility] CV LAB;  Service: Cardiovascular;  Laterality: N/A;   RIGHT/LEFT HEART CATH AND CORONARY ANGIOGRAPHY N/A 07/29/2021   Procedure: RIGHT/LEFT HEART CATH AND CORONARY ANGIOGRAPHY;  Surgeon: Burnell Blanks, MD;  Location: [facility] CV LAB;  Service: Cardiovascular;  Laterality: N/A;   SHOULDER ARTHROSCOPY WITH ROTATOR CUFF REPAIR Left 2017   "[name]"   TEE WITHOUT CARDIOVERSION N/A 07/29/2021   Procedure: TRANSESOPHAGEAL ECHOCARDIOGRAM (TEE);  Surgeon: Josue Hector, MD;  Location: [facility] ENDOSCOPY;  Service: Cardiovascular;  Laterality: N/A;       Family History  Problem Relation Age of Onset   Diabetes Father    Hypertension Father    Cancer Father        lung, basal cell, melanoma   Breast cancer Mother    Colon cancer Neg Hx    Esophageal cancer Neg Hx    Rectal cancer Neg Hx    Stomach cancer Neg  Hx    Colon polyps Neg Hx     Social History   Tobacco Use   Smoking status: Former    Packs/day: 0.10    Years: 3.00    Pack years: 0.30    Types: Cigarettes   Smokeless tobacco: Never  Vaping Use   Vaping Use: Never used  Substance Use Topics   Alcohol use: No   Drug use: No    Home Medications Prior to Admission medications   Medication Sig Start  Date End Date Taking? Authorizing Provider  apixaban (ELIQUIS) 5 MG TABS tablet Take 1 tablet (5 mg total) by mouth 2 (two) times daily. 08/25/21 09/24/21 Yes Donald Memoli, MD  ACCU-CHEK GUIDE test strip USE AS INSTRUCTED TO CHECK BLOOD SUGARS TWICE WEEKLY. 06/04/21   Burchette, Alinda Sierras, MD  acetaminophen (TYLENOL) 325 MG tablet Take 650 mg by mouth every 6 (six) hours as needed for moderate pain.    [provider]  fexofenadine (ALLEGRA) 180 MG tablet Take 180 mg by mouth daily as needed for allergies or rhinitis.    [provider]  metFORMIN (GLUCOPHAGE) 500 MG tablet TAKE 2 TABLETS BY MOUTH TWICE DAILY WITH MEALS 08/05/21   Burchette, Alinda Sierras, MD  metoprolol tartrate (LOPRESSOR) 25 MG tablet Take 0.5 tablets (12.5 mg total) by mouth 2 (two) times daily. 08/03/21   Cheryln Manly, NP  Multiple Vitamins-Minerals (MEGA MULTI MEN PO) Take 1 tablet by mouth daily.    [provider]  sitaGLIPtin (JANUVIA) 50 MG tablet Take 1 tablet (50 mg total) by mouth daily. 07/31/21 08/30/21  Darliss Cheney, MD    Allergies    Patient has no known allergies.  Review of Systems   Review of Systems  Constitutional:  Negative for chills and fever.  HENT:  Negative for ear pain and sore throat.   Eyes:  Negative for pain and visual disturbance.  Respiratory:  Negative for cough and shortness of breath.   Cardiovascular:  Positive for palpitations and syncope. Negative for chest pain.  Gastrointestinal:  Negative for abdominal pain and vomiting.  Genitourinary:  Negative for dysuria and hematuria.  Musculoskeletal:  Negative for arthralgias and back pain.  Skin:  Negative for color change and rash.  Neurological:  Positive for syncope. Negative for seizures.  All other systems reviewed and are negative.  Physical Exam Updated Vital Signs BP 100/83    Pulse 99    Temp 98.8 F (37.1 C) (Oral)    Resp (!) 21    SpO2 96%   Physical Exam Vitals and nursing note reviewed.   Constitutional:      General: He is not in acute distress.    Appearance: He is well-developed.  HENT:     Head: Normocephalic and atraumatic.  Eyes:     Conjunctiva/sclera: Conjunctivae normal.  Cardiovascular:     Rate and Rhythm: Tachycardia present. Rhythm irregular.     Heart sounds: No murmur heard. Pulmonary:     Effort: Pulmonary effort is normal. No respiratory distress.     Breath sounds: Normal breath sounds.  Abdominal:     Palpations: Abdomen is soft.     Tenderness: There is no abdominal tenderness.  Musculoskeletal:        General: No swelling.     Cervical back: Neck supple.  Skin:    General: Skin is warm and dry.     Capillary Refill: Capillary refill takes less than 2 seconds.  Neurological:     Mental Status:  He is alert.  Psychiatric:        Mood and Affect: Mood normal.    ED Results / Procedures / Treatments   Labs (all labs ordered are listed, but only abnormal results are displayed) Labs Reviewed  COMPREHENSIVE METABOLIC PANEL - Abnormal; Notable for the following components:      Result Value   Sodium 133 (*)    Chloride 96 (*)    Glucose, Bld 151 (*)    Albumin 3.4 (*)    All other components within normal limits  CBC WITH DIFFERENTIAL/PLATELET - Abnormal; Notable for the following components:   RBC 3.96 (*)    Hemoglobin 11.6 (*)    HCT 35.5 (*)    Platelets 507 (*)    Eosinophils Absolute 0.6 (*)    Basophils Absolute 0.2 (*)    All other components within normal limits  I-STAT CHEM 8, ED - Abnormal; Notable for the following components:   Sodium 134 (*)    Chloride 97 (*)    BUN 26 (*)    Glucose, Bld 146 (*)    Calcium, Ion 1.10 (*)    Hemoglobin 12.6 (*)    HCT 37.0 (*)    All other components within normal limits  TROPONIN I (HIGH SENSITIVITY) - Abnormal; Notable for the following components:   Troponin I (High Sensitivity) 42 (*)    All other components within normal limits  TROPONIN I (HIGH SENSITIVITY)    EKG EKG  Interpretation  Date/Time:  Sunday August 25 2021 16:43:07 EST Ventricular Rate:  150 PR Interval:  156 QRS Duration: 103 QT Interval:  304 QTC Calculation: 481 R Axis:   18 Text Interpretation: aflutter with 2:1 conduction Confirmed by Lesean Woolverton ([id number]) on 08/26/2021 12:52:59 AM  Radiology No results found.  Procedures .Sedation  Date/Time: 08/26/2021 12:56 AM Performed by: Teressa Lower, MD Authorized by: Teressa Lower, MD   Consent:    Consent obtained:  Written   Risks discussed:  Allergic reaction, prolonged hypoxia resulting in organ damage, dysrhythmia, prolonged sedation necessitating reversal, inadequate sedation, respiratory compromise necessitating ventilatory assistance and intubation, nausea and vomiting Universal protocol:    Immediately prior to procedure, a time out was called: yes   Indications:    Procedure performed:  Cardioversion Pre-sedation assessment:    Time since last food or drink:  0800   ASA classification: class 3 - patient with severe systemic disease     Mallampati score:  I - soft palate, uvula, fauces, pillars visible   Pre-sedation assessments completed and reviewed: airway patency, cardiovascular function, mental status, nausea/vomiting, respiratory function and temperature   Procedure details (see MAR for exact dosages):    Preoxygenation:  Nasal cannula   Sedation:  Etomidate   Intended level of sedation: deep   Analgesia:  None   Intra-procedure monitoring:  Blood pressure monitoring, continuous capnometry, frequent LOC assessments and cardiac monitor   Intra-procedure events: none     Total Provider sedation time (minutes):  15 Post-procedure details:    Post-sedation assessments completed and reviewed: airway patency, mental status, respiratory function and temperature     Procedure completion:  Tolerated well, no immediate complications .Cardioversion  Date/Time: 08/26/2021 12:57 AM Performed by: Teressa Lower,  MD Authorized by: Teressa Lower, MD   Consent:    Consent obtained:  Written   Consent given by:  Patient   Risks discussed:  Cutaneous burn, induced arrhythmia, death and pain   Alternatives discussed:  Rate-control medication Pre-procedure details:  Cardioversion basis:  Elective   Rhythm:  Atrial flutter   Electrode placement:  Anterior-posterior Attempt one:    Cardioversion mode:  Synchronous   Waveform:  Monophasic   Shock (Joules):  200   Shock outcome:  Conversion to normal sinus rhythm Post-procedure details:    Patient tolerance of procedure:  Tolerated well, no immediate complications .Critical Care Performed by: Teressa Lower, MD Authorized by: Teressa Lower, MD   Critical care provider statement:    Critical care time (minutes):  30   Critical care was necessary to treat or prevent imminent or life-threatening deterioration of the following conditions:  Cardiac failure   Critical care was time spent personally by me on the following activities:  Development of treatment plan with patient or surrogate, discussions with consultants, evaluation of patient's response to treatment, examination of patient, ordering and review of laboratory studies, ordering and review of radiographic studies, ordering and performing treatments and interventions, pulse oximetry, re-evaluation of patient's condition and review of old charts   Medications Ordered in ED Medications  lactated ringers bolus 1,000 mL (0 mLs Intravenous Stopped 08/25/21 1818)  etomidate (AMIDATE) injection (2 mg Intravenous Given 08/25/21 1726)  magnesium oxide (MAG-OX) tablet 800 mg (800 mg Oral Given 08/25/21 1852)    ED Course  I have reviewed the triage vital signs and the nursing notes.  Pertinent labs & imaging results that were available during my care of the patient were reviewed by me and considered in my medical decision making (see chart for details).    MDM Rules/Calculators/A&P                           Patient seen the emergency department for evaluation of syncope and rapid heart rate.  Physical exam reveals a rapid irregular heart rate, sternal suture is healing appropriately, no evidence of rales or fluid overload on exam.  Initial laboratory evaluation with mild hypochloremia but is otherwise unremarkable, hemoglobin 11.6, initial troponin 42 likely elevated in setting of type II demand ischemia due to patient being in atrial flutter.  I spoke with patient's cardiologist Dr. Curt Bears who states that the patient is within the window to receive an elective cardioversion today and the patient should have this procedure done.  Cardiology states that if the procedure is successful in converting the patient to normal sinus rhythm that he should be discharged with Eliquis and will follow up in the outpatient setting.  I discussed with him about syncope in the setting of possible V. tach and we reviewed the EKG strips together and if the patient had no true evidence of ventricular tachycardia we believe the patient may have had a flutter with aberrancy or ventricular bigeminy.  The cardioversion was performed at bedside with etomidate for sedation and the patient returned to normal sinus rhythm with intermittent PVCs after single shock.  On reevaluation, I discussed the risks and benefits of leaving the emergency department from the patient and his family are requesting to leave with outpatient cardiology follow-up.  They voiced understanding of the risks of leaving including return to abnormal life-threatening rhythm and even death.  Patient was placed on Eliquis and will follow-up outpatient with cardiology.   Final Clinical Impression(s) / ED Diagnoses Final diagnoses:  Syncope, unspecified syncope type  Typical atrial flutter ([place])    Rx / DC Orders ED Discharge Orders          Ordered    apixaban (ELIQUIS) 5 MG  TABS tablet  2 times daily        08/25/21 1845              Dakotah Orrego, [place], MD 08/26/21 [phone]

## 2021-08-25 NOTE — ED Notes (Signed)
Consent signed, at bedside.

## 2021-08-25 NOTE — Sedation Documentation (Signed)
Shock administered by Kommor MD at [facility]. Converted to NSR with frequent multifocal PVCs.

## 2021-08-25 NOTE — ED Triage Notes (Signed)
Pt BIB [facility] c/o syncopal episode today. EMS reports runs of Vtach and reports that pt is in ST with frequent multifocal PVCs. Pt has noticed a "weird feeling" over the last several days. Mitral valve repair at [facility] 12/8. Given 150 amio and 324 asa with EMS. Denies CP.   EMS VS 128/56

## 2021-08-28 NOTE — Progress Notes (Signed)
Cardiology Office Note Date:  08/28/2021  Patient ID:  Terry Johns 07-31-1956, MRN [id number] PCP:  Terry Post, MD  Cardiologist:  Dr. Marlou Johns Electrophysiologist: Dr. Curt Johns    Chief Complaint: ER f/u  History of Present Illness: Terry Johns is a 65 y.o. 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 Marlou Johns and Dr Terry Johns (CT surgery- [facility]) with plans to pursue MVR at [facility]   [facility] (73mm Simulus, P2 triangular resection) via R thoracotomy on 08/08/21  He had an ER visit 08/25/21 with syncope, preceded by palpitations, he was brought via EMS, seem in rout or while with EMS they observed what they thought was VT and given a bolus of amiodarone, though ER MD felt this was AFlutter with PVC/bigeminal  With known time of onset underwent DCCV in the ER to SR, started on Eliquis and in d/w Dr. Curt Johns discharged to f/u  out pt.   He saw [facility] cardiac/CTS team  08/27/21 there was no discussion of his syncopal event, they report a Johns op monitor done that apparently had no arrhythmias note  08/25/21 K+ 3.9, 4.1 BUN/Creat 26/0.70 HS Trop 42 WBC 8.8 H/H 12/37 Plts 507 EKG AFlutter 150bpm, suspect atypical EKG ST 132, PACs, PVCs, couplet 3 beat salvo  TODAY He is accompanied by his wife He has not had further syncope, says he did d/w his syncope at the CTS visit but deferred t [place].  He reports that he was seated watching gift opening when he felt a  bit flushed or flush rise up to his head, decided he was not feeling well and went to lay down.  After getting in the bedroom wanted to take a shower, his wife was with him and as he entered the bathroom fainted. He did not have any cardiac awareness, or overt sense of palpitations at that time or afterwards. His wife said he was out seconds, but woke somewhat confused briefly about what had happened.  This event he says much like the syncopal event that he initially went to he hospital with that led to his MV repair.    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   EXTERNAL HEMORRHOIDS 05/17/2009   MITRAL VALVE PROLAPSE 05/17/2009    Past Surgical History:  Procedure Laterality Date   COLONOSCOPY  2008   FEMORAL ARTERY EXPLORATION Right 12/09/2016   Procedure: Evacuation of Hematoma Right Groin, Repair of Right Superfiical Femoral Artery;  Surgeon: Angelia Mould, MD;  Location: [facility];  Service: Vascular;  Laterality: Right;   PVC ABLATION  12/08/2016   PVC ABLATION N/A 12/08/2016   Procedure: PVC Ablation;  Surgeon: Will [name], MD;  Location: [facility] CV [facility];  Service: Cardiovascular;  Laterality: N/A;   RIGHT/LEFT HEART CATH AND CORONARY ANGIOGRAPHY N/A 07/29/2021   Procedure: RIGHT/LEFT HEART CATH AND CORONARY ANGIOGRAPHY;  Surgeon: Burnell Blanks, MD;  Location: [facility] [facility];  Service: Cardiovascular;  Laterality: N/A;   SHOULDER ARTHROSCOPY WITH ROTATOR CUFF REPAIR Left 2017   "[name]"   TEE WITHOUT CARDIOVERSION N/A 07/29/2021   Procedure: TRANSESOPHAGEAL ECHOCARDIOGRAM (TEE);  Surgeon: Josue Hector, MD;  Location[facility] END[facility]COPY;  Service: Cardiovascular;  Laterality: N/A;    Current Outpatient Medications  Medication Sig Dispense Refill   ACCU-CHEK GUIDE test strip USE AS  INSTRUCTED TO CHECK BLOOD SUGARS TWICE WEEKLY. 200 strip 0   acetaminophen (TYLENOL) 325 MG tablet Take 650 mg by mouth every 6 (six) hours as needed for moderate pain.     apixaban (ELIQUIS) 5 MG TABS tablet Take 1 tablet (5 mg total) by mouth 2 (two) times daily. 60 tablet 0   fexofenadine (ALLEGRA) 180 MG tablet Take 180 mg by mouth daily as needed for allergies or rhinitis.     metFORMIN (GLUCOPHAGE) 500 MG tablet TAKE 2 TABLETS BY MOUTH TWICE DAILY WITH MEALS 360 tablet 1   metoprolol tartrate (LOPRESSOR) 25 MG tablet Take 0.5 tablets (12.5 mg total) by mouth 2 (two) times daily.     Multiple Vitamins-Minerals (MEGA MULTI MEN PO) Take 1 tablet by mouth daily.     sitaGLIPtin (JANUVIA) 50 MG tablet Take 1 tablet (50 mg total) by mouth daily. 30 tablet 0   No current facility-administered medications for this visit.    Allergies:   Patient has no known allergies.   Social History:  The patient  reports that he has quit smoking. His smoking use included cigarettes. He has a 0.30 pack-year smoking history. He has never used smokeless tobacco. He reports that he does not drink alcohol and does not use drugs.   Family History:  The patient's family history includes Breast cancer in his mother; Cancer in his father; Diabetes in his father; Hypertension in his father.  ROS:  Please see the history of present illness.    All other systems are reviewed and otherwise negative.   PHYSICAL EXAM:  VS:  There were no vitals taken for this visit. BMI: There is no height or weight on file to calculate BMI. Well nourished, well developed, in no acute distress HEENT: normocephalic, atraumatic Neck: no JVD, carotid bruits or masses Cardiac:  RRR; no significant murmurs, no rubs, or gallops Lungs:  CTA b/l, no wheezing, rhonchi or rales Abd: soft, nontender MS: no deformity or atrophy Ext: no edema Skin: warm and dry, no rash, thoracotomy is well healed, no signs of infection Neuro:  No gross  deficits appreciated Psych: euthymic mood, full affect   EKG:  Done today and reviewed by myself shows  SR 95bpm, , PVCs (3), 2 morphologies   08/13/21; TTE INTERPRETATION ---------------------------------------------------------------    NORMAL LEFT VENTRICULAR SYSTOLIC FUNCTION WITH MILD LVH    NORMAL RIGHT VENTRICULAR SYSTOLIC FUNCTION    VALVULAR REGURGITATION: TRIVIAL MR, MILD TR    PROSTHETIC VALVE(S): PROSTHETIC MV RING    POOR PARASTERNAL WINDOWS    FREQUENT ECTOPY    NO PRIOR STUDY FOR COMPARISON   07/28/21/22: TTE IMPRESSIONS   1. Left ventricular ejection fraction, by estimation, is 50 to 55%. The  left ventricle has low normal function. The left ventricle has no regional  wall motion abnormalities. There is mild left ventricular hypertrophy.  Left ventricular diastolic  parameters are consistent with Grade II diastolic dysfunction  (pseudonormalization). Elevated left atrial pressure.   2. Right ventricular systolic function is normal. The right  ventricular  size is normal. Tricuspid regurgitation signal is inadequate for assessing  PA pressure.   3. Left atrial size was severely dilated.   4. The aortic valve is tricuspid. Aortic valve regurgitation is not  visualized. No aortic stenosis is present.   5. The inferior vena cava is normal in size with greater than 50%  respiratory variability, suggesting right atrial pressure of 3 mmHg.   6. The mitral valve is abnormal. Bileaflet prolapse. Moderate mitral  valve regurgitation. No evidence of mitral stenosis. Mitral regurgitation  jet is poorly visualized. Suspect underestimating MR given significant  bileaflet prolapse with severely  dilated left atrium and elevated E wave velocity. Would recommend TEE or  cardiac MRI for further evaluation.    07/29/2021: LHC No angiographic evidence of CAD Normal LVEDP   Recommendations: No further ischemic workup.   07/29/21: TEE IMPRESSIONS   1. Left ventricular  ejection fraction, by estimation, is 60 to 65%. The  left ventricle has normal function. The left ventricle has no regional  wall motion abnormalities.   2. Right ventricular systolic function is normal. The right ventricular  size is normal.   3. Left atrial size was severely dilated. No left atrial/left atrial  appendage thrombus was detected.   4. Right atrial size was mildly dilated.   5. 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 Blunted systolic PV signals , PISA radium 1.0 cm  and color flow suggest severe MR ERO 0.34 cm2 and RVolum 48 cc suggest  moderate to severe MR . The mitral  valve is myxomatous. Severe mitral valve regurgitation. No evidence of  mitral stenosis.   6. The aortic valve is normal in structure. Aortic valve regurgitation is  not visualized. No aortic stenosis is present.   7. The inferior vena cava is normal in size with greater than 50%  respiratory variability, suggesting right atrial pressure of 3 mmHg.   Conclusion(s)/Recommendation(s): Normal biventricular function without  evidence of hemodynamically significant valvular heart disease.   12/08/2016: EPS/ablation CONCLUSIONS:  1. Sinus rhythm upon presentation.  2. Frequent PVCs mapped to the posterior LV septum  3. Successful radiofrequency modification of the PVCs  4. No inducible arrhythmias following ablation.  5. No early apparent complications.   Recent Labs: 07/28/2021: Magnesium 2.0; TSH 2.480 08/25/2021: ALT 12; BUN 26; Creatinine, Ser 0.70; Hemoglobin 12.6; Platelets 507; Potassium 4.1; Sodium 134  No results found for requested labs within last 8760 hours.   CrCl cannot be calculated (Unknown ideal weight.).   Wt Readings from Last 3 Encounters:  08/04/21 184 lb 1.4 oz (83.5 kg)  07/30/21 193 lb 2 oz (87.6 kg)  02/12/21 191 lb 3.2 oz (86.7 kg)     Other studies reviewed: Additional studies/records reviewed today include: summarized  above  ASSESSMENT AND PLAN:  Syncope Had some kind of warning that he was not feeling well, flushed sensation/perhaps weak, but wife says at the time of the fainting seemed more of a sudden event  PVCs Hx of ablation 2018 2 morphologies on today's EKG  New AFlutter (suspect atypical) CHA2DS2Vasc is 2, on Eliquis No bleeding reported  VHD  S/p MV repair   Discussed management strategies and unclear mechanism of his synmcope Will have him wear a ZIo AT Increase his lopressor to 50mg  BID (he was taking 25mg  BID, not 12.5) Likely will need an AAD, perhaps sotalol, try to avoid amiodarone, wait on monitor findings PVC burden, number of morphologies Do not  anticipate repeat ablation at this time  The patient was advised, no driving 46mo, [place]   Disposition: F/u with Dr. Curt Johns in a month, sooner if needed  Current medicines are reviewed at length with the patient today.  The patient did not have any concerns regarding medicines.  Venetia Night, PA-C 08/28/2021 5:29 PM     [address] [place]  [place] [address] [phone] (office)  [phone] (fax)

## 2021-08-29 ENCOUNTER — Encounter: Payer: Self-pay | Admitting: Physician Assistant

## 2021-08-29 ENCOUNTER — Ambulatory Visit (INDEPENDENT_AMBULATORY_CARE_PROVIDER_SITE_OTHER): Payer: Medicare Other

## 2021-08-29 ENCOUNTER — Other Ambulatory Visit: Payer: Self-pay

## 2021-08-29 ENCOUNTER — Ambulatory Visit (INDEPENDENT_AMBULATORY_CARE_PROVIDER_SITE_OTHER): Payer: Medicare Other | Admitting: Physician Assistant

## 2021-08-29 VITALS — BP 110/78 | HR 92 | Ht 74.0 in | Wt 183.0 lb

## 2021-08-29 DIAGNOSIS — R55 Syncope and collapse: Secondary | ICD-10-CM

## 2021-08-29 DIAGNOSIS — Z9889 Other specified postprocedural states: Secondary | ICD-10-CM

## 2021-08-29 DIAGNOSIS — I4892 Unspecified atrial flutter: Secondary | ICD-10-CM | POA: Diagnosis not present

## 2021-08-29 DIAGNOSIS — I493 Ventricular premature depolarization: Secondary | ICD-10-CM

## 2021-08-29 MED ORDER — METOPROLOL TARTRATE 50 MG PO TABS: 50.0000 mg | ORAL_TABLET | Freq: Two times a day (BID) | ORAL | 1 refills | Status: DC

## 2021-08-29 NOTE — Progress Notes (Unsigned)
Enrolled for [employer] to mail a ZIO AT Live Telemetry monitor to patients address on file.  

## 2021-08-29 NOTE — Patient Instructions (Signed)
Medication Instructions:    START TAKING METOPROLOL 50 MG TWICE A DAY    *If you need a refill on your cardiac medications before your next appointment, please call your pharmacy*   Lab Work: NONE ORDERED  TODAY   If you have labs (blood work) drawn today and your tests are completely normal, you will receive your results only by: [place] (if you have MyChart) OR A paper copy in the mail If you have any lab test that is abnormal or we need to change your treatment, we will call you to review the results.   Testing/Procedures: Your physician has recommended that you wear an event monitor. Event monitors are medical devices that record the hearts electrical activity. Doctors most often [place] these monitors to diagnose arrhythmias. Arrhythmias are problems with the speed or rhythm of the heartbeat. The monitor is a small, portable device. You can wear one while you do your normal daily activities. This is usually used to diagnose what is causing palpitations/syncope (passing out).    Follow-Up: At [facility], you and your health needs are our priority.  As part of our continuing mission to provide you with exceptional heart care, we have created designated Provider Care Teams.  These Care Teams include your primary Cardiologist (physician) and Advanced Practice Providers (APPs -  Physician Assistants and Nurse Practitioners) who all work together to provide you with the care you need, when you need it.  We recommend signing up for the patient portal called "MyChart".  Sign up information is provided on this After Visit Summary.  MyChart is used to connect with patients for Virtual Visits (Telemedicine).  Patients are able to view lab/test results, encounter notes, upcoming appointments, etc.  Non-urgent messages can be sent to your provider as well.   To learn more about what you can do with MyChart, go to [email or web address].    Your next appointment:    1 month(s)  (  CONTACT [facility] [place] )   The format for your next appointment:   In Person  Provider:   Allegra Lai, MD   Other Instructions  ZIO AT Long term monitor-Live Telemetry  Your physician has requested you wear a ZIO patch monitor for 14 days.  This is a single patch monitor. [employer] supplies one patch monitor per enrollment. Additional  stickers are not available.  Please do not apply patch if you will be having a Nuclear Stress Test, Echocardiogram, Cardiac CT, MRI,  or Chest Xray during the period you would be wearing the monitor. The patch cannot be worn during  these tests. You cannot remove and re-apply the ZIO AT patch monitor.  Your ZIO patch monitor will be mailed 3 day USPS to your address on file. It may take 3-5 days to  receive your monitor after you have been enrolled.  Once you have received your monitor, please review the enclosed instructions. Your monitor has  already been registered assigning a specific monitor serial # to you.   Billing and Patient Assistance Program information  Theodore Demark has been supplied with any insurance information on record for billing. [employer] offers a sliding scale Patient Assistance Program for patients without insurance, or whose  insurance does not completely cover the cost of the ZIO patch monitor. You must apply for the  Patient Assistance Program to qualify for the discounted rate. To apply, call [employer] at [phone],  select option 4, select option 2 , ask to apply for the Patient Assistance Program, (  you can request an  interpreter if needed). [employer] will ask your household income and how many people are in your  household. [employer] will quote your out-of-pocket cost based on this information. They will also be able  to set up a 12 month interest free payment plan if needed.  Applying the monitor   Shave hair from upper left chest.  Hold the abrader disc by orange tab. Rub the abrader in 40 strokes over left  upper chest as indicated in  your monitor instructions.  Clean area with 4 enclosed alcohol pads. Use all pads to ensure the area is cleaned thoroughly. Let  dry.  Apply patch as indicated in monitor instructions. Patch will be placed under collarbone on left side of  chest with arrow pointing upward.  Rub patch adhesive wings for 2 minutes. Remove the white label marked "1". Remove the white label  marked "2". Rub patch adhesive wings for 2 additional minutes.  While looking in a mirror, press and release button in center of patch. A small green light will flash 3-4  times. This will be your only indicator that the monitor has been turned on.  Do not shower for the first 24 hours. You may shower after the first 24 hours.  Press the button if you feel a symptom. You will hear a small click. Record Date, Time and Symptom in  the Patient Log.   Starting the Gateway  In your kit there is a Hydrographic surveyor box the size of a cellphone. This is Airline pilot. It transmits all your  recorded data to [facility]. This box must always stay within 10 feet of you. Open the box and push the *  button. There will be a light that blinks orange and then green a few times. When the light stops  blinking, the Gateway is connected to the ZIO patch. Call [employer] at [phone] to confirm your monitor is transmitting.  Returning your monitor  Remove your patch and place it inside the [place]. In the lower half of the Gateway there is a white  bag with prepaid postage on it. Place Gateway in bag and seal. Mail package back to [place] as soon as  possible. Your physician should have your final report approximately 7 days after you have mailed back  your monitor. Call [place] at [phone] if you have questions regarding your ZIO AT  patch monitor. Call them immediately if you see an orange light blinking on your monitor.  If your monitor falls off in less than 4 days, contact our  Monitor department at [phone]. If your  monitor becomes loose or falls off after 4 days call [employer] at [phone] for suggestions on  securing your monitor

## 2021-08-30 ENCOUNTER — Encounter (HOSPITAL_COMMUNITY): Payer: Self-pay | Admitting: *Deleted

## 2021-08-30 NOTE — Progress Notes (Signed)
Received notification from Dr. Cheree Ditto at [facility] that this pt is interested in participating in Cardiac rehab s/p 08/08/21 Mini MV Repair.  Reviewed admission notes and post op visit with CVTS in [place]. Pt also completed follow up with Tommye Standard PA with Dr. Curt Bears and [name].  Plan for zio patch due to syncopal episode observed in the home by pt wife. Pt has upcoming follow up appt on 1/24 Richardson Dopp PA and 1/30 with Dr. Curt Bears.  When pt is medically stable and appropriate for group exercise electronic referral can be placed by Dr. [name].  Will continue to follow along. Cherre Huger, BSN Cardiac and Training and development officer

## 2021-08-31 DIAGNOSIS — R55 Syncope and collapse: Secondary | ICD-10-CM | POA: Diagnosis not present

## 2021-09-19 ENCOUNTER — Telehealth: Payer: Self-pay | Admitting: Physician Assistant

## 2021-09-19 NOTE — Telephone Encounter (Signed)
Informed pt to keep both appts Pt verbalized understanding

## 2021-09-19 NOTE — Telephone Encounter (Signed)
Patient wants to know if his appt on 1/24 with Kathlen Mody is necessary as he also has an appt coming up on 1/30.

## 2021-09-24 ENCOUNTER — Other Ambulatory Visit: Payer: Self-pay

## 2021-09-24 ENCOUNTER — Ambulatory Visit (INDEPENDENT_AMBULATORY_CARE_PROVIDER_SITE_OTHER): Payer: Medicare Other | Admitting: Nurse Practitioner

## 2021-09-24 ENCOUNTER — Encounter: Payer: Self-pay | Admitting: Nurse Practitioner

## 2021-09-24 VITALS — BP 104/72 | HR 96 | Ht 73.5 in | Wt 189.6 lb

## 2021-09-24 DIAGNOSIS — I340 Nonrheumatic mitral (valve) insufficiency: Secondary | ICD-10-CM | POA: Diagnosis not present

## 2021-09-24 DIAGNOSIS — I493 Ventricular premature depolarization: Secondary | ICD-10-CM

## 2021-09-24 DIAGNOSIS — Z9889 Other specified postprocedural states: Secondary | ICD-10-CM | POA: Diagnosis not present

## 2021-09-24 DIAGNOSIS — Z7901 Long term (current) use of anticoagulants: Secondary | ICD-10-CM | POA: Diagnosis not present

## 2021-09-24 DIAGNOSIS — I341 Nonrheumatic mitral (valve) prolapse: Secondary | ICD-10-CM | POA: Diagnosis not present

## 2021-09-24 DIAGNOSIS — R55 Syncope and collapse: Secondary | ICD-10-CM

## 2021-09-24 MED ORDER — APIXABAN 5 MG PO TABS: 5.0000 mg | ORAL_TABLET | Freq: Two times a day (BID) | ORAL | 3 refills | Status: DC

## 2021-09-24 NOTE — Progress Notes (Signed)
Cardiology Office Note:    Date:  09/24/2021   ID:  AARONJAMES KELSAY, DOB 20-Apr-1956, MRN [id number]  PCP:  [name], MD   [facility] Providers Cardiologist:  Candee Furbish, MD Electrophysiologist:  Constance Haw, MD     Referring MD: [name], MD   Chief Complaint: follow-up syncope, MVR  History of Present Illness:    BRAIN HONEYCUTT is a 66 y.o. male with a hx of mitral valve regurgitation, PVC ablation, Ehlers-Danlos syndrome, T2DM, and syncope  Has a history of PVCs status post ablation 7619, complicated by pseudoaneurysm requiring surgical repair.  On 07/27/2021 he was admitted to [facility] with syncope, cardiology was consulted with suspicion of vagal event although a loud murmur was noted on exam. Telemetry showed frequent PVCs.  LHC on 11/2/ revealed no angiographic evidence of CAD, normal LVEDP, with recommendation for no further ischemic evaluation.  TEE on 07/29/2021 revealed LVEF 60 to 65%, no regional wall motion abnormalities, normal RV, bileaflet prolapse with predominant lesion being P1 scallop with anteriorly directed jet with readings suggestive of moderate to severe MR. 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 TTE on 11/27 revealed slightly reduced LVEF 50-55%, G2DD.  He was discharged on 07/31/2021 with plans to go to [facility] for evaluation for mitral valve repair.  Unfortunately he returned to the hospital on 08/01/2021 with near syncope due to frequent PVCs. He requested stabilization at [facility] with transfer to [facility] for surgical repair.  He remained stable during hospitalization and was transferred to [facility] on 08/03/2021. He underwent mitral valve repair at [facility] on 08/08/2021.  He presented to [facility] ED on 08/25/21 for evaluation of syncope and rapid heart rate.  He reported an episode earlier that day when he passed out and EMS was called.  His wife reports that he fainted as he was walking into the bathroom and was out a few seconds with  some confusion briefly following . EMS reported NSVT when attempting to move the patient, however the ED physician reviewed strips that showed atrial flutter and ventricular bigeminy.  He was given amiodarone in the field and at the time of arrival in the ED he was in atrial flutter 2:1 pattern.  Dr. Curt Bears, [place] cardiologist was consulted and cardioversion was recommended with chronic anticoagulation following.  He was discharged home with plan for follow-up with cardiology as an outpatient.  Today, he is here with his wife for hospital follow-up.  He states he has had no further syncope.  He describes the feeling that led him to the hospital on 12/25 as a flushing feeling coming up from his feet.  He states the event felt much like the event that occurred on 11/26 that led him to the hospital and was found to have severe mitral valve prolapse.that led to surgery.  He completed cardiac monitoring and has an appointment on 1/30 with Dr. Curt Bears.  We will await further advisement on treatment of arrhythmias. He denies chest pain, shortness of breath, lower extremity edema, fatigue, palpitations, melena, hematuria, hemoptysis, diaphoresis, weakness, presyncope, syncope, orthopnea, and PND.   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   EXTERNAL HEMORRHOIDS 05/17/2009   MITRAL VALVE PROLAPSE 05/17/2009    Past Surgical History:  Procedure Laterality Date   COLONOSCOPY  2008   FEMORAL ARTERY EXPLORATION Right 12/09/2016  Procedure: Evacuation of Hematoma Right Groin, Repair of Right Superfiical Femoral Artery;  Surgeon: Angelia Mould, MD;  Location: [place];  Service: Vascular;  Laterality: Right;   PVC ABLATION  12/08/2016   PVC ABLATION N/A 12/08/2016   Procedure: PVC Ablation;  Surgeon: Will [name], MD;  Location: [place] [facility]  LAB;  Service: Cardiovascular;  Laterality: N/A;   RIGHT/LEFT HEART CATH AND CORONARY ANGIOGRAPHY N/A 07/29/2021   Procedure: RIGHT/LEFT HEART CATH AND CORONARY ANGIOGRAPHY;  Surgeon: Burnell Blanks, MD;  Location: [place] [facility];  Service: Cardiovascular;  Laterality: N/A;   SHOULDER ARTHROSCOPY WITH ROTATOR CUFF REPAIR Left 2017   "[name]"   TEE WITHOUT CARDIOVERSION N/A 07/29/2021   Procedure: TRANSESOPHAGEAL ECHOCARDIOGRAM (TEE);  Surgeon: Josue Hector, MD;  Location: [facility];  Service: Cardiovascular;  Laterality: N/A;    Current Medications: Current Meds  Medication Sig   ACCU-CHEK GUIDE test strip USE AS INSTRUCTED TO CHECK BLOOD SUGARS TWICE WEEKLY.   acetaminophen (TYLENOL) 325 MG tablet Take 650 mg by mouth every 6 (six) hours as needed for moderate pain.   aspirin EC 81 MG tablet Take 81 mg by mouth daily. Swallow whole.   metFORMIN (GLUCOPHAGE) 500 MG tablet TAKE 2 TABLETS BY MOUTH TWICE DAILY WITH MEALS   metoprolol tartrate (LOPRESSOR) 50 MG tablet Take 1 tablet (50 mg total) by mouth 2 (two) times daily.   [DISCONTINUED] apixaban (ELIQUIS) 5 MG TABS tablet Take 1 tablet (5 mg total) by mouth 2 (two) times daily.     Allergies:   Patient has no known allergies.   Social History   Socioeconomic History   Marital status: Married    Spouse name: Not on file   Number of children: Not on file   Years of education: Not on file   Highest education level: Not on file  Occupational History   Not on file  Tobacco Use   Smoking status: Former    Packs/day: 0.10    Years: 3.00    Pack years: 0.30    Types: Cigarettes   Smokeless tobacco: Never  Vaping Use   Vaping Use: Never used  Substance and Sexual Activity   Alcohol use: No   Drug use: No   Sexual activity: Yes  Other Topics Concern   Not on file  Social History Narrative   Not on file   Social Determinants of Health   Financial Resource Strain: Low Risk    Difficulty of  Paying Living Expenses: Not hard at all  Food Insecurity: No Food Insecurity   Worried About Charity fundraiser in the Last Year: Never true   [place] in the Last Year: Never true  Transportation Needs: No Transportation Needs   Lack of Transportation (Medical): No   Lack of Transportation (Non-Medical): No  Physical Activity: Insufficiently Active   Days of Exercise per Week: 3 days   Minutes of Exercise per Session: 30 min  Stress: No Stress Concern Present   Feeling of Stress : Not at all  Social Connections: Socially Integrated   Frequency of Communication with Friends and Family: Twice a week   Frequency of Social Gatherings with Friends and Family: Twice a week   Attends Religious Services: More than 4 times per year   Active Member of [employer] or Organizations: Yes   Attends Archivist Meetings: 1 to 4 times per year   Marital Status: Married     Family History: The  patient's family history includes Breast cancer in his mother; Cancer in his father; Diabetes in his father; Hypertension in his father. There is no history of Colon cancer, Esophageal cancer, Rectal cancer, Stomach cancer, or Colon polyps.  ROS:   Please see the history of present illness.  All other systems reviewed and are negative.  Labs/Other Studies Reviewed:    The following studies were reviewed today:  Cardiac monitor 09/24/21  Patch Wear Time:  14 days and 0 hours    Predominant underlying rhythm was sinus rhythm 1 run of ventricular tachycardia 20 beats at 226 bpm 4.3% ventricular ectopy Less than 1% supraventricular ectopy No triggered episodes recorded  [facility] 07/29/21  No angiographic evidence of CAD Normal LVEDP   Recommendations: No further ischemic workup.   ECHO TEE 07/29/21  Left Ventricle: Left ventricular ejection fraction, by estimation, is 60  to 65%. The left ventricle has normal function. The left ventricle has no  regional wall motion abnormalities. The  left ventricular internal cavity  size was normal in size. There is  no left ventricular hypertrophy.  Right Ventricle: The right ventricular size is normal. No increase in  right ventricular wall thickness. Right ventricular systolic function is  normal.  Left Atrium: Left atrial size was severely dilated. No left atrial/left  atrial appendage thrombus was detected.  Right Atrium: Right atrial size was mildly dilated.  Pericardium: There is no evidence of pericardial effusion.  Mitral Valve: Bi leaflet prolapse with predominant lesion being P1 scallop  with anteriorly directed jet. Blunted systolic PV signals , PISA radium  1.0 cm and color flow suggest severe MR ERO 0.34 cm2 and RVolum 48 cc  suggest moderate to severe MR. The  mitral valve is myxomatous. Severe mitral valve regurgitation. No evidence  of mitral valve stenosis.  Tricuspid Valve: The tricuspid valve is normal in structure. Tricuspid  valve regurgitation is mild . No evidence of tricuspid stenosis.  Aortic Valve: The aortic valve is normal in structure. Aortic valve  regurgitation is not visualized. No aortic stenosis is present.  Pulmonic Valve: The pulmonic valve was normal in structure. Pulmonic valve  regurgitation is not visualized. No evidence of pulmonic stenosis.  Aorta: The aortic root is normal in size and structure.  Venous: The inferior vena cava is normal in size with greater than 50%  respiratory variability, suggesting right atrial pressure of 3 mmHg.  IAS/Shunts: No atrial level shunt detected by color flow Doppler.    Echo 07/28/21  Left Ventricle: Left ventricular ejection fraction, by estimation, is 50  to 55%. The left ventricle has low normal function. The left ventricle has  no regional wall motion abnormalities. The left ventricular internal  cavity size was normal in size. There is mild left ventricular hypertrophy. Left ventricular diastolic parameters are consistent with Grade II diastolic  dysfunction  (pseudonormalization). Elevated left atrial pressure.  Right Ventricle: The right ventricular size is normal. No increase in  right ventricular wall thickness. Right ventricular systolic function is  normal. Tricuspid regurgitation signal is inadequate for assessing PA  pressure.  Left Atrium: Left atrial size was severely dilated.  Right Atrium: Right atrial size was normal in size.  Pericardium: There is no evidence of pericardial effusion.  Mitral Valve: The mitral valve is abnormal. Moderate mitral valve  regurgitation. No evidence of mitral valve stenosis.  Tricuspid Valve: The tricuspid valve is normal in structure. Tricuspid  valve regurgitation is trivial.  Aortic Valve: The aortic valve is tricuspid. Aortic valve regurgitation  is  not visualized. No aortic stenosis is present.  Pulmonic Valve: The pulmonic valve was not well visualized. Pulmonic valve  regurgitation is not visualized.  Aorta: The aortic root is normal in size and structure.  Venous: The inferior vena cava is normal in size with greater than 50%  respiratory variability, suggesting right atrial pressure of 3 mmHg.  IAS/Shunts: The interatrial septum was not well visualized.   Recent Labs: 07/28/2021: Magnesium 2.0; TSH 2.480 08/25/2021: ALT 12; BUN 26; Creatinine, Ser 0.70; Hemoglobin 12.6; Platelets 507; Potassium 4.1; Sodium 134  Recent Lipid Panel    Component Value Date/Time   CHOL 197 01/11/2020 1110   TRIG 91.0 01/11/2020 1110   HDL 64.00 01/11/2020 1110   CHOLHDL 3 01/11/2020 1110   VLDL 18.2 01/11/2020 1110   LDLCALC 115 (H) 01/11/2020 1110     Risk Assessment/Calculations:    CHA2DS2-VASc Score = 2  This indicates a 2.2% annual risk of stroke. The patient's score is based upon: CHF History: 0 HTN History: 0 Diabetes History: 1 Stroke History: 0 Vascular Disease History: 0 Age Score: 1 Gender Score: 0    Physical Exam:    VS:  BP 104/72 (BP Location: Right Arm)     Pulse 96    Ht 6' 1.5" (1.867 m)    Wt 189 lb 9.6 oz (86 kg)    SpO2 95%    BMI 24.68 kg/m     Wt Readings from Last 3 Encounters:  09/24/21 189 lb 9.6 oz (86 kg)  08/29/21 183 lb (83 kg)  08/04/21 184 lb 1.4 oz (83.5 kg)     GEN:  Well nourished, well developed in no acute distress HEENT: Normal NECK: No JVD; No carotid bruits CARDIAC: RRR,  2/6 systolic murmur auscultated throughout chest. No rubs, gallops RESPIRATORY:  Clear to auscultation without rales, wheezing or rhonchi  ABDOMEN: Soft, non-tender, non-distended MUSCULOSKELETAL:  No edema; No deformity. 2+ pedal pulses, equal bilaterally SKIN: Right thoracotomy surgical site with edges well-approximated, no erythema, no drainage. Warm and dry NEUROLOGIC:  Alert and oriented x 3 PSYCHIATRIC:  Normal affect   EKG:  EKG is not ordered today.    Diagnoses:    1. S/P MVR (mitral valve repair)   2. PVC's (premature ventricular contractions)   3. Frequent PVCs   4. Mitral valve prolapse   5. Mild mitral regurgitation   6. Chronic anticoagulation   7. Syncope and collapse    Assessment and Plan:     Mitral valve disorder s/p MVR: He is feeling well from a surgical standpoint, however his recovery has been complicated by syncope.  Surgical incision sites are healing well.  He has 1 area with a stitch that has not dissolved which I was able to clip.  He states he has been released by [facility] surgery.  We will order echocardiogram for postop evaluation of valve function. Continue metoprolol.  Syncope/NSVT: Recent cardiac monitor reveals 1 run of ventricular tachycardia at 20 beats. Based on the time, patient thinks he was sleeping at the time. He had 4.3% ventricular ectopy and less than 1% supraventricular ectopy. Patient reports he did not have symptoms of presyncope or syncope while wearing monitor. He previously had PVC ablation. Question of ventricular trigeminy during ED visit 12/25. He has an appointment with EP cardiologist  on 1/30. Continue metoprolol.  Atrial flutter: CHA2DS2Vasc is 2. He underwent cardioversion in the emergency department on 12/25 with advisement to remain on Eliquis following procedure until further advisement.  He has an appointment with Dr. Curt Bears on 1/30.  Samples of Eliquis given today.  Disposition: keep appointment with Dr. Curt Bears on 1/30. Return to see Dr. [name] in 3 months   Medication Adjustments/Labs and Tests Ordered: Current medicines are reviewed at length with the patient today.  Concerns regarding medicines are outlined above.  Orders Placed This Encounter  Procedures   ECHOCARDIOGRAM COMPLETE   Meds ordered this encounter  Medications   apixaban (ELIQUIS) 5 MG TABS tablet    Sig: Take 1 tablet (5 mg total) by mouth 2 (two) times daily.    Dispense:  180 tablet    Refill:  3    Patient Instructions  Medication Instructions:   Your physician recommends that you continue on your current medications as directed. Please refer to the Current Medication list given to you today.  *If you need a refill on your cardiac medications before your next appointment, please call your pharmacy*   Lab Work:  None ordered.  If you have labs (blood work) drawn today and your tests are completely normal, you will receive your results only by: [place] (if you have MyChart) OR A paper copy in the mail If you have any lab test that is abnormal or we need to change your treatment, we will call you to review the results.   Testing/Procedures:  Your physician has requested that you have an echocardiogram. Echocardiography is a painless test that uses sound waves to create images of your heart. It provides your doctor with information about the size and shape of your heart and how well your hearts chambers and valves are working. This procedure takes approximately one hour. There are no restrictions for this procedure.    Follow-Up: At [facility], you and your health  needs are our priority.  As part of our continuing mission to provide you with exceptional heart care, we have created designated Provider Care Teams.  These Care Teams include your primary Cardiologist (physician) and Advanced Practice Providers (APPs -  Physician Assistants and Nurse Practitioners) who all work together to provide you with the care you need, when you need it.  We recommend signing up for the patient portal called "MyChart".  Sign up information is provided on this After Visit Summary.  MyChart is used to connect with patients for Virtual Visits (Telemedicine).  Patients are able to view lab/test results, encounter notes, upcoming appointments, etc.  Non-urgent messages can be sent to your provider as well.   To learn more about what you can do with MyChart, go to [email or web address].    Your next appointment:   3 month(s)  The format for your next appointment:   In Person  Provider:   Candee Furbish, MD     Other Instructions  Keep your follow up with Dr. Curt Bears.     Signed, Emmaline Life, NP  09/24/2021 4:29 PM    [facility]

## 2021-09-24 NOTE — Patient Instructions (Signed)
Medication Instructions:   Your physician recommends that you continue on your current medications as directed. Please refer to the Current Medication list given to you today.  *If you need a refill on your cardiac medications before your next appointment, please call your pharmacy*   Lab Work:  None ordered.  If you have labs (blood work) drawn today and your tests are completely normal, you will receive your results only by: [place] (if you have MyChart) OR A paper copy in the mail If you have any lab test that is abnormal or we need to change your treatment, we will call you to review the results.   Testing/Procedures:  Your physician has requested that you have an echocardiogram. Echocardiography is a painless test that uses sound waves to create images of your heart. It provides your doctor with information about the size and shape of your heart and how well your hearts chambers and valves are working. This procedure takes approximately one hour. There are no restrictions for this procedure.    Follow-Up: At [facility], you and your health needs are our priority.  As part of our continuing mission to provide you with exceptional heart care, we have created designated Provider Care Teams.  These Care Teams include your primary Cardiologist (physician) and Advanced Practice Providers (APPs -  Physician Assistants and Nurse Practitioners) who all work together to provide you with the care you need, when you need it.  We recommend signing up for the patient portal called "MyChart".  Sign up information is provided on this After Visit Summary.  MyChart is used to connect with patients for Virtual Visits (Telemedicine).  Patients are able to view lab/test results, encounter notes, upcoming appointments, etc.  Non-urgent messages can be sent to your provider as well.   To learn more about what you can do with MyChart, go to [email or web address].    Your next appointment:    3 month(s)  The format for your next appointment:   In Person  Provider:   Candee Furbish, MD     Other Instructions  Keep your follow up with Dr. Curt Bears.

## 2021-09-30 ENCOUNTER — Ambulatory Visit (INDEPENDENT_AMBULATORY_CARE_PROVIDER_SITE_OTHER): Payer: Medicare Other | Admitting: Cardiology

## 2021-09-30 ENCOUNTER — Encounter: Payer: Self-pay | Admitting: Cardiology

## 2021-09-30 ENCOUNTER — Other Ambulatory Visit: Payer: Self-pay

## 2021-09-30 VITALS — BP 108/78 | HR 88 | Ht 73.5 in | Wt 191.8 lb

## 2021-09-30 DIAGNOSIS — I484 Atypical atrial flutter: Secondary | ICD-10-CM

## 2021-09-30 DIAGNOSIS — I493 Ventricular premature depolarization: Secondary | ICD-10-CM

## 2021-09-30 NOTE — Patient Instructions (Addendum)
Medication Instructions:  Your physician recommends that you continue on your current medications as directed. Please refer to the Current Medication list given to you today.  *If you need a refill on your cardiac medications before your next appointment, please call your pharmacy*   Lab Work: None ordered   Testing/Procedures: None ordered   Follow-Up: At [facility], you and your health needs are our priority.  As part of our continuing mission to provide you with exceptional heart care, we have created designated Provider Care Teams.  These Care Teams include your primary Cardiologist (physician) and Advanced Practice Providers (APPs -  Physician Assistants and Nurse Practitioners) who all work together to provide you with the care you need, when you need it.  Your next appointment:   3 month(s)  The format for your next appointment:   In Person  Provider:   Allegra Lai, MD   You have been referred to Cardiac Rehab after your mitral valve surgery    Thank you for choosing [facility]!!   Trinidad Curet, RN [phone]

## 2021-09-30 NOTE — Progress Notes (Signed)
Electrophysiology Office Note   Date:  09/30/2021   ID:  Terry Johns, DOB July 01, 1956, MRN [id number]  PCP:  [name], MD  Cardiologist:  [name] Primary Electrophysiologist:  Anmarie Fukushima [name], MD    No chief complaint on file.    History of Present Illness: Terry Johns is a 66 y.o. male who presents today for electrophysiology evaluation.   Terry Johns is a 66 y.o. male who is being seen today for the evaluation of PVCs at the request of Burchette, Alinda Sierras, MD.   History of Ehlers-Danlos syndrome, mitral valve prolapse with regurgitation status post MVR, PVCs status post ablation.  He presented to the hospital 07/27/2021 with an episode of syncope.  He was found to have severe mitral regurgitation.  He was transferred to [facility] for mitral valve replacement.  He presented to the hospital 08/25/2021 with syncope and rapid heart rate.  His wife reports that he fainted as he was walking to the bathroom.  EMS reported nonsustained VT when attempting to move the patient.  On presentation the emergency room, the patient was in atrial flutter with ventricular bigeminy.  He was given amiodarone and was in a 2-1 flutter.  He was cardioverted.  Today, denies symptoms of palpitations, chest pain, shortness of breath, orthopnea, PND, lower extremity edema, claudication, dizziness, presyncope, syncope, bleeding, or neurologic sequela. The patient is tolerating medications without difficulties.  Today he feels well.  He has no chest pain or shortness of breath.  He has had no further episodes of syncope.  He does continue to have episodic palpitations, though he overall feels well.   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   EXTERNAL HEMORRHOIDS 05/17/2009   MITRAL VALVE PROLAPSE 05/17/2009   Past Surgical  History:  Procedure Laterality Date   COLONOSCOPY  2008   FEMORAL ARTERY EXPLORATION Right 12/09/2016   Procedure: Evacuation of Hematoma Right Groin, Repair of Right Superfiical Femoral Artery;  Surgeon: Angelia Mould, MD;  Location: [place];  Service: Vascular;  Laterality: Right;   PVC ABLATION  12/08/2016   PVC ABLATION N/A 12/08/2016   Procedure: PVC Ablation;  Surgeon: Winfred Iiams [name], MD;  Location: [place] CV LAB;  Service: Cardiovascular;  Laterality: N/A;   RIGHT/LEFT HEART CATH AND CORONARY ANGIOGRAPHY N/A 07/29/2021   Procedure: RIGHT/LEFT HEART CATH AND CORONARY ANGIOGRAPHY;  Surgeon: Burnell Blanks, MD;  Location: [place] CV LAB;  Service: Cardiovascular;  Laterality: N/A;   SHOULDER ARTHROSCOPY WITH ROTATOR CUFF REPAIR Left 2017   "[name]"   TEE WITHOUT CARDIOVERSION N/A 07/29/2021   Procedure: TRANSESOPHAGEAL ECHOCARDIOGRAM (TEE);  Surgeon: Josue Hector, MD;  Location: [facility] ENDOSCOPY;  Service: Cardiovascular;  Laterality: N/A;     Current Outpatient Medications  Medication Sig Dispense Refill   ACCU-CHEK GUIDE test strip USE AS INSTRUCTED TO CHECK BLOOD SUGARS TWICE WEEKLY. 200 strip 0   acetaminophen (TYLENOL) 325 MG tablet Take 650 mg by mouth every 6 (six) hours as needed for moderate pain.     apixaban (ELIQUIS) 5 MG TABS tablet Take 1 tablet (5 mg total) by mouth 2 (two) times daily. 180 tablet 3   aspirin EC 81 MG tablet Take 81 mg by mouth daily. Swallow whole.     metFORMIN (GLUCOPHAGE) 500 MG tablet TAKE 2 TABLETS BY  MOUTH TWICE DAILY WITH MEALS 360 tablet 1   metoprolol tartrate (LOPRESSOR) 50 MG tablet Take 1 tablet (50 mg total) by mouth 2 (two) times daily. 180 tablet 1   No current facility-administered medications for this visit.    Allergies:   Patient has no known allergies.   Social History:  The patient  reports that he has quit smoking. His smoking use included cigarettes. He has a 0.30 pack-year smoking history.  He has never used smokeless tobacco. He reports that he does not drink alcohol and does not use drugs.   Family History:  The patient's family history includes Breast cancer in his mother; Cancer in his father; Diabetes in his father; Hypertension in his father.   ROS:  Please see the history of present illness.   Otherwise, review of systems is positive for none.   All other systems are reviewed and negative.   PHYSICAL EXAM: VS:  BP 108/78    Pulse 88    Ht 6' 1.5" (1.867 m)    Wt 191 lb 12.8 oz (87 kg)    SpO2 97%    BMI 24.96 kg/m  , BMI Body mass index is 24.96 kg/m. GEN: Well nourished, well developed, in no acute distress  HEENT: normal  Neck: no JVD, carotid bruits, or masses Cardiac: RRR; no murmurs, rubs, or gallops,no edema  Respiratory:  clear to auscultation bilaterally, normal work of breathing GI: soft, nontender, nondistended, + BS MS: no deformity or atrophy  Skin: warm and dry Neuro:  Strength and sensation are intact Psych: euthymic mood, full affect  EKG:  EKG is not ordered today. Personal review of the ekg ordered 08/29/21 shows sinus rhythm, rate 95, PVCs  Recent Labs: 07/28/2021: Magnesium 2.0; TSH 2.480 08/25/2021: ALT 12; BUN 26; Creatinine, Ser 0.70; Hemoglobin 12.6; Platelets 507; Potassium 4.1; Sodium 134    Lipid Panel     Component Value Date/Time   CHOL 197 01/11/2020 1110   TRIG 91.0 01/11/2020 1110   HDL 64.00 01/11/2020 1110   CHOLHDL 3 01/11/2020 1110   VLDL 18.2 01/11/2020 1110   LDLCALC 115 (H) 01/11/2020 1110     Wt Readings from Last 3 Encounters:  09/30/21 191 lb 12.8 oz (87 kg)  09/24/21 189 lb 9.6 oz (86 kg)  08/29/21 183 lb (83 kg)      Other studies Reviewed: Additional studies/ records that were reviewed today include:  TEE 07/29/2021  1. Left ventricular ejection fraction, by estimation, is 60 to 65%. The  left ventricle has normal function. The left ventricle has no regional  wall motion abnormalities.   2. Right  ventricular systolic function is normal. The right ventricular  size is normal.   3. Left atrial size was severely dilated. No left atrial/left atrial  appendage thrombus was detected.   4. Right atrial size was mildly dilated.   5. 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 Blunted systolic PV signals , PISA radium 1.0 cm  and color flow suggest severe MR ERO 0.34 cm2 and RVolum 48 cc suggest  moderate to severe MR . The mitral  valve is myxomatous. Severe mitral valve regurgitation. No evidence of  mitral stenosis.   6. The aortic valve is normal in structure. Aortic valve regurgitation is  not visualized. No aortic stenosis is present.   7. The inferior vena cava is normal in size with greater than 50%  respiratory variability, suggesting right atrial pressure of 3 mmHg.  Cardiac monitor 09/23/2021 personally reviewed Predominant underlying rhythm was sinus rhythm 1 run of ventricular tachycardia 20 beats at 226 bpm 4.3% ventricular ectopy Less than 1% supraventricular ectopy No triggered episodes recorded  ASSESSMENT AND PLAN:  1.  PVCs: 20% on Holter monitor.  Now status post ablation.  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  More recent cardiac monitor that showed 4.3% ventricular ectopy and a 20 beat run of ventricular tachycardia.  This episode occurred while he was sleeping.  He was completely asymptomatic while wearing the monitor.  I have told him to get a cardia mobile for further monitoring.  2.  Mitral regurgitation: Status post MVR.  Transthoracic echo pending.  3.  Atrial flutter: Underwent cardioversion in the emergency room.  CHA2DS2-VASc of 2.  Currently on Eliquis.  May be a consequence of his recent surgery and left atrial enlargement.  We Evalise Abruzzese hold off on further therapy for now.  He has had no further episodes of atrial flutter.  Current medicines are  reviewed at length with the patient today.   The patient does not have concerns regarding his medicines.  The following changes were made today: None  Labs/ tests ordered today include:  Orders Placed This Encounter  Procedures   AMB referral to cardiac rehabilitation   Case discussed with primary cardiology  Disposition:   FU with Arra Connaughton 3 months  Signed, Joniyah Mallinger [name], MD  09/30/2021 11:55 AM     [place] [place] [address] [phone] (office) [phone] (fax)

## 2021-10-07 ENCOUNTER — Other Ambulatory Visit: Payer: Self-pay

## 2021-10-07 ENCOUNTER — Encounter: Payer: Self-pay | Admitting: Cardiology

## 2021-10-07 ENCOUNTER — Ambulatory Visit (HOSPITAL_COMMUNITY): Payer: Medicare Other | Attending: Cardiology

## 2021-10-07 DIAGNOSIS — I340 Nonrheumatic mitral (valve) insufficiency: Secondary | ICD-10-CM | POA: Diagnosis present

## 2021-10-07 DIAGNOSIS — Z952 Presence of prosthetic heart valve: Secondary | ICD-10-CM

## 2021-10-07 DIAGNOSIS — I493 Ventricular premature depolarization: Secondary | ICD-10-CM

## 2021-10-07 DIAGNOSIS — Z9889 Other specified postprocedural states: Secondary | ICD-10-CM

## 2021-10-07 DIAGNOSIS — I341 Nonrheumatic mitral (valve) prolapse: Secondary | ICD-10-CM

## 2021-10-07 LAB — ECHOCARDIOGRAM COMPLETE
Area-P 1/2: 3.13 cm2
MV VTI: 1.68 cm2
S' Lateral: 3.7 cm

## 2021-10-08 ENCOUNTER — Other Ambulatory Visit: Payer: Self-pay | Admitting: Nurse Practitioner

## 2021-10-08 MED ORDER — METOPROLOL SUCCINATE ER 100 MG PO TB24
100.0000 mg | ORAL_TABLET | Freq: Every day | ORAL | 3 refills | Status: DC
Start: 2021-10-08 — End: 2022-09-18

## 2021-10-09 ENCOUNTER — Telehealth (HOSPITAL_COMMUNITY): Payer: Self-pay

## 2021-10-09 NOTE — Telephone Encounter (Signed)
Called and spoke with pt in regards to CR, pt stated he is interested. Adv pt of CR backlog of 1-2 months.

## 2021-10-14 NOTE — Telephone Encounter (Signed)
Left message to call back  

## 2021-10-16 NOTE — Telephone Encounter (Signed)
Patient returned RN's call. 

## 2021-10-22 NOTE — Telephone Encounter (Signed)
Trish Fountain, RN (cardiac rehab RN) spoke with pt and reviewed with him what he can and cannot do. Pt appreciated the information/education.

## 2021-10-25 ENCOUNTER — Telehealth (HOSPITAL_COMMUNITY): Payer: Self-pay

## 2021-10-25 NOTE — Telephone Encounter (Signed)
Called patient to see if he was interested in participating in the Cardiac Rehab Program. Patient stated yes. Patient will come in for orientation on 11/19/21 @ 930AM and will attend the 1030AM exercise class. Went over insurance, patient verbalized understanding.    Tourist information centre manager.

## 2021-10-25 NOTE — Telephone Encounter (Signed)
Pt insurance is active and benefits verified through Medicare A/B. Co-pay $0.00, DED $226.00/$226.00 met, out of pocket $0.00/$0.00 met, co-insurance 20%. No pre-authorization required. Passport, 10/25/21 @ 12:00PM, EML#[id number]   2ndary insurance is active and benefits verified through [employer]. Co-pay $0.00, DED $0.00/$0.00 met, out of pocket $0.00/$0.00 met, co-insurance 0%. No pre-authorization required. Passport, 10/25/21 @ 12:03PM, OIT#[id number]    Will contact patient to see if he is interested in the Cardiac Rehab Program.

## 2021-11-15 ENCOUNTER — Telehealth (HOSPITAL_COMMUNITY): Payer: Self-pay

## 2021-11-15 NOTE — Telephone Encounter (Signed)
Patient returning call. Confirmed cardiac rehab orientation for 11/19/21 at 9:30 am. Health history completed. Confirmed patient has received directions and cardiac rehab contact information vis mail. All questions answered.  ?

## 2021-11-15 NOTE — Telephone Encounter (Signed)
Unsuccessful telephone encounter to patient to confirm cardiac rehab orientation for 11/19/21 at 0930 and to complete a brief health history. [employer] compliant VM message left requesting call back to [phone]. ?

## 2021-11-19 ENCOUNTER — Encounter (HOSPITAL_COMMUNITY): Payer: Self-pay

## 2021-11-19 ENCOUNTER — Other Ambulatory Visit: Payer: Self-pay

## 2021-11-19 ENCOUNTER — Encounter (HOSPITAL_COMMUNITY)
Admission: RE | Admit: 2021-11-19 | Discharge: 2021-11-19 | Disposition: A | Discharge status: Home / Self Care | Payer: Medicare Other | Source: Ambulatory Visit | Attending: Cardiology | Admitting: Cardiology

## 2021-11-19 VITALS — BP 102/64 | HR 97 | Ht 72.0 in | Wt 192.0 lb

## 2021-11-19 DIAGNOSIS — Z79899 Other long term (current) drug therapy: Secondary | ICD-10-CM | POA: Diagnosis not present

## 2021-11-19 DIAGNOSIS — Z9889 Other specified postprocedural states: Secondary | ICD-10-CM | POA: Insufficient documentation

## 2021-11-19 LAB — GLUCOSE, CAPILLARY: Glucose-Capillary: 280 mg/dL — ABNORMAL HIGH (ref 70–99)

## 2021-11-19 NOTE — Progress Notes (Signed)
Terry Johns is taking his medications as prescribed and has a good understanding of what his medications are for and is compliant with his medications.Alireza checks his CBG's daily.Barnet Pall, RN,BSN ?11/19/2021 3:14 PM  ?

## 2021-11-19 NOTE — Progress Notes (Signed)
Patient here for cardiac rehab orientation. Frequent PVC's, couplets, bigeminy and trigeminal runs noted. Patient asymptomatic, Blood pressure 102/64. Oxygen saturation 96% on room air. Resting heart rate in the 90's. Reino Bellis NP paged and notified. Ria Comment said that Mr Noelle Penner is okay to proceed with orientation and exercise at cardiac rehab. Will monitor and call if patient has any symptoms. Ria Comment says she will check on moving his appointment up to be reevaluated by EP. Will fax exercise flow sheets to Dr. Curt Bears office for review. Zorian completed his 6 minute walk test without issues. Will continue to monitor the patient throughout  the program.Soledad Budreau Venetia Maxon, RN,BSN ?11/19/2021 12:01 PM    ?

## 2021-11-19 NOTE — Progress Notes (Signed)
Cardiac Individual Treatment Plan ? ?Patient Details  ?Name: Terry Johns ?MRN: [id number] ?Date of Birth: [birth year] ?Referring Provider:   ?Flowsheet Row CARDIAC REHAB PHASE II ORIENTATION from 11/19/2021 in [place]  ?Referring Provider Constance Haw, MD  ? ?  ? ? ?Initial Encounter Date:  ?Flowsheet Row CARDIAC REHAB PHASE II ORIENTATION from 11/19/2021 in [place]  ?Date 11/19/21  ? ?  ? ? ?Visit Diagnosis: 08/08/21 MVR at [facility] Dr Cheree Ditto ? ?Patient's Home Medications on Admission: ? ?Current Outpatient Medications:  ?  acetaminophen (TYLENOL) 500 MG tablet, Take 500-1,000 mg by mouth every 6 (six) hours as needed (pain.)., Disp: , Rfl:  ?  apixaban (ELIQUIS) 5 MG TABS tablet, Take 1 tablet (5 mg total) by mouth 2 (two) times daily., Disp: 180 tablet, Rfl: 3 ?  aspirin EC 81 MG tablet, Take 81 mg by mouth in the morning. Swallow whole., Disp: , Rfl:  ?  metFORMIN (GLUCOPHAGE) 500 MG tablet, TAKE 2 TABLETS BY MOUTH TWICE DAILY WITH MEALS, Disp: 360 tablet, Rfl: 1 ?  metoprolol succinate (TOPROL XL) 100 MG 24 hr tablet, Take 1 tablet (100 mg total) by mouth daily. Take with or immediately following a meal., Disp: 90 tablet, Rfl: 3 ?  ACCU-CHEK GUIDE test strip, USE AS INSTRUCTED TO CHECK BLOOD SUGARS TWICE WEEKLY., Disp: 200 strip, Rfl: 0 ? ?Past Medical History: ?Past Medical History:  ?Diagnosis Date  ? Cataract   ? Diabetes mellitus without complication ([place])   ? DYSPHAGIA UNSPECIFIED 05/28/2010  ? Ehlers-Danlos disease   ? 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 EXTERNAL HEMORRHOIDS 05/17/2009  ? MITRAL VALVE PROLAPSE 05/17/2009  ? ? ?Tobacco Use: ?Social History  ? ?Tobacco Use  ?Smoking Status Former  ? Packs/day: 0.10  ? Years: 3.00  ? Pack years: 0.30  ? Types: Cigarettes  ?Smokeless Tobacco Never  ? ? ?Labs: ?Recent Review Flowsheet Data   ? ? Labs for ITP Cardiac and Pulmonary Rehab Latest Ref Rng & Units 02/12/2021 07/29/2021  07/29/2021 07/29/2021 08/25/2021  ? Cholestrol 0 - 200 mg/dL - - - - -  ? LDLCALC 0 - 99 mg/dL - - - - -  ? HDL >39.00 mg/dL - - - - -  ? Trlycerides 0.0 - 149.0 mg/dL - - - - -  ? Hemoglobin A1c 4.8 - 5.6 % 7.0(A) - - 7.4(H) -  ? PHART 7.350 - 7.450 - - 7.390 - -  ? PCO2ART 32.0 - 48.0 mmHg - - 39.8 - -  ? HCO3 20.0 - 28.0 mmol/L - 25.9 24.1 - -  ? TCO2 22 - 32 mmol/L - 27 25 - 29  ? ACIDBASEDEF 0.0 - 2.0 mmol/L - - 1.0 - -  ? O2SAT % - 72.0 97.0 - -  ? ?  ? ? ?Capillary Blood Glucose: ?Lab Results  ?Component Value Date  ? GLUCAP 280 (H) 11/19/2021  ? GLUCAP 140 (H) 08/04/2021  ? GLUCAP 127 (H) 08/03/2021  ? GLUCAP 137 (H) 08/03/2021  ? GLUCAP 152 (H) 08/03/2021  ? ? ? ?Exercise Target Goals: ?Exercise Program Goal: ?Individual exercise prescription set using results from initial 6 min walk test and THRR while considering  patient?s activity barriers and safety.  ? ?Exercise Prescription Goal: ?Initial exercise prescription builds to 30-45 minutes a day of aerobic activity, 2-3 days per week.  Home exercise guidelines will be given to patient during program as part of exercise prescription that the participant  will acknowledge. ? ?Activity Barriers & Risk Stratification: ? Activity Barriers & Cardiac Risk Stratification - 11/19/21 0956   ? ?  ? Activity Barriers & Cardiac Risk Stratification  ? Activity Barriers None   ? Cardiac Risk Stratification High   ? ?  ?  ? ?  ? ? ?6 Minute Walk: ? 6 Minute Walk   ? ? [place] Name 11/19/21 1006  ?  ?  ?  ? 6 Minute Walk  ? Phase Initial    ? Distance 1800 feet    ? Walk Time 6 minutes    ? # of Rest Breaks 0    ? MPH 3.41    ? METS 4.46    ? RPE 11    ? Perceived Dyspnea  0    ? VO2 Peak 15.61    ? Symptoms No    ? Resting HR 97 bpm    ? Resting BP 102/64    ? Resting Oxygen Saturation  96 %    ? Exercise Oxygen Saturation  during 6 min walk 94 %    ? Max Ex. HR 120 bpm    ? Max Ex. BP 142/70    ? 2 Minute Post BP 124/72    ? ?  ?  ? ?  ? ? ?Oxygen Initial  Assessment: ? ? ?Oxygen Re-Evaluation: ? ? ?Oxygen Discharge (Final Oxygen Re-Evaluation): ? ? ?Initial Exercise Prescription: ? Initial Exercise Prescription - 11/19/21 1000   ? ?  ? Date of Initial Exercise RX and Referring Provider  ? Date 11/19/21   ? Referring Provider Constance Haw, MD   ? Expected Discharge Date 01/17/22   ?  ? Bike  ? Level 2   ? Minutes 15   ? METs 2.8   ?  ? NuStep  ? Level 3   ? SPM 85   ? Minutes 15   ? METs 2.8   ?  ? Prescription Details  ? Frequency (times per week) 3   ? Duration Progress to 30 minutes of continuous aerobic without signs/symptoms of physical distress   ?  ? Intensity  ? THRR 40-80% of Max Heartrate 62-123   ? Ratings of Perceived Exertion 11-13   ? Perceived Dyspnea 0-4   ?  ? Progression  ? Progression Continue to progress workloads to maintain intensity without signs/symptoms of physical distress.   ?  ? Resistance Training  ? Training Prescription Yes   ? Weight 4 lbs   ? Reps 10-15   ? ?  ?  ? ?  ? ? ?Perform Capillary Blood Glucose checks as needed. ? ?Exercise Prescription Changes: ? ? ?Exercise Comments: ? ? ?Exercise Goals and Review: ? ? Exercise Goals   ? ? [place] Name 11/19/21 [phone]  ?  ?  ?  ?  ?  ? Exercise Goals  ? Increase Physical Activity Yes      ? Intervention Provide advice, education, support and counseling about physical activity/exercise needs.;Develop an individualized exercise prescription for aerobic and resistive training based on initial evaluation findings, risk stratification, comorbidities and participant's personal goals.      ? Expected Outcomes Short Term: Attend rehab on a regular basis to increase amount of physical activity.;Long Term: Exercising regularly at least 3-5 days a week.;Long Term: Add in home exercise to make exercise part of routine and to increase amount of physical activity.      ? Increase Strength and Stamina Yes      ?  Intervention Provide advice, education, support and counseling about physical  activity/exercise needs.;Develop an individualized exercise prescription for aerobic and resistive training based on initial evaluation findings, risk stratification, comorbidities and participant's personal goals.      ? Expected Outcomes Short Term: Increase workloads from initial exercise prescription for resistance, speed, and METs.;Short Term: Perform resistance training exercises routinely during rehab and add in resistance training at home;Long Term: Improve cardiorespiratory fitness, muscular endurance and strength as measured by increased METs and functional capacity (6MWT)      ? Able to understand and use rate of perceived exertion (RPE) scale Yes      ? Intervention Provide education and explanation on how to use RPE scale      ? Expected Outcomes Short Term: Able to use RPE daily in rehab to express subjective intensity level;Long Term:  Able to use RPE to guide intensity level when exercising independently      ? Knowledge and understanding of Target Heart Rate Range (THRR) Yes      ? Intervention Provide education and explanation of THRR including how the numbers were predicted and where they are located for reference      ? Expected Outcomes Short Term: Able to state/look up THRR;Long Term: Able to use THRR to govern intensity when exercising independently;Short Term: Able to use daily as guideline for intensity in rehab      ? Able to check pulse independently Yes      ? Intervention Provide education and demonstration on how to check pulse in carotid and radial arteries.;Review the importance of being able to check your own pulse for safety during independent exercise      ? Expected Outcomes Short Term: Able to explain why pulse checking is important during independent exercise;Long Term: Able to check pulse independently and accurately      ? Understanding of Exercise Prescription Yes      ? Intervention Provide education, explanation, and written materials on patient's individual exercise  prescription      ? Expected Outcomes Short Term: Able to explain program exercise prescription;Long Term: Able to explain home exercise prescription to exercise independently      ? ?  ?  ? ?  ? ? ?Exercise Goals Re-Evaluation : ? ? ?Discharge Exe

## 2021-11-21 ENCOUNTER — Telehealth: Payer: Self-pay | Admitting: Cardiology

## 2021-11-21 NOTE — Telephone Encounter (Signed)
Patient states Cardiac Rehab told him they would be in contact with our office to get him a sooner appointment than 4/05 with Dr. [name]. He states they were sending his EKG and reaching out to cardiology as well. He would like to know if EKG has been reviewed and whether he can be worked in. I informed him that right now Dr. Wilburn Mylar do not have anything sooner, but will forward to clinical staff for further advisement.  ?

## 2021-11-21 NOTE — Telephone Encounter (Signed)
Patient here for cardiac rehab orientation. Frequent PVC's, couplets, bigeminy and trigeminal runs noted. Patient asymptomatic, Blood pressure 102/64. Oxygen saturation 96% on room air. Resting heart rate in the 90's. Reino Bellis NP paged and notified. Ria Comment said that Mr Terry Johns is okay to proceed with orientation and exercise at cardiac rehab. Will monitor and call if patient has any symptoms. Ria Comment says she will check on moving his appointment up to be reevaluated by EP. Will fax exercise flow sheets to Dr. Curt Bears office for review. Wisdom completed his 6 minute walk test without issues. Will continue to monitor the patient throughout  the program.Terry Venetia Maxon, RN,BSN ?11/19/2021 12:01 PM     ?  ?  ?  ?Electronically signed by Magda Kiel, RN at 11/19/2021 12:01 PM ? ? ?Spoke with pt who reports he is unsure with whom he should follow up with at this point,  per the above documentation pt should f/u with Dr Curt Bears regarding his recent abnormal EKG in cardiac rehab.  I have not been able to locate any exercise flow sheets send from cardiac rehab for Dr Curt Bears to review.   ?Today pt reports he is unable to tell when he has PVCs etc.  He has not obtained the [facility] mobile as instructed by Dr Curt Bears but will look into it.  Today he has felt weak and tired.  He HR was 46 or 47 bpm when he checked his BP.  Advised pt to continue to monitor VS and symptoms.  Advised I will locate the exercise flow sheets, have them reviewed by MD and call back to notify who he should f/u with and when.  Pt states understanding.  ?Message sent to Barnet Pall asking for location of flow sheet or for her to refax to Dr Curt Bears for review.   ?

## 2021-11-25 ENCOUNTER — Telehealth: Payer: Self-pay | Admitting: Cardiology

## 2021-11-25 ENCOUNTER — Encounter (HOSPITAL_COMMUNITY)
Admission: RE | Admit: 2021-11-25 | Discharge: 2021-11-25 | Disposition: A | Discharge status: Home / Self Care | Payer: Medicare Other | Source: Ambulatory Visit | Attending: Cardiology | Admitting: Cardiology

## 2021-11-25 ENCOUNTER — Other Ambulatory Visit: Payer: Self-pay

## 2021-11-25 DIAGNOSIS — Z9889 Other specified postprocedural states: Secondary | ICD-10-CM | POA: Diagnosis not present

## 2021-11-25 LAB — GLUCOSE, CAPILLARY
Glucose-Capillary: 199 mg/dL — ABNORMAL HIGH (ref 70–99)
Glucose-Capillary: 127 mg/dL — ABNORMAL HIGH (ref 70–99)

## 2021-11-25 NOTE — Progress Notes (Signed)
Daily Session Note ? ?Patient Details  ?Name: Terry Johns ?MRN: [id number] ?Date of Birth: 1955/12/28 ?Referring Provider:   ?Flowsheet Row CARDIAC REHAB PHASE II ORIENTATION from 11/19/2021 in [place]  ?Referring Provider Constance Haw, MD  ? ?  ? ? ?Encounter Date: 11/25/2021 ? ?Check In: ? Session Check In - 11/25/21 1029   ? ?  ? Check-In  ? Supervising physician immediately available to respond to emergencies [facility] immediately available   ? Physician(s) Dr. Pietro Cassis   ? Location [facility]   ? Staff Present Barnet Pall, RN, BSN;Ramon Dredge, RN, MHA;Olinty Celesta Aver, MS, ACSM CEP, Exercise Physiologist;David Lilyan Punt, MS, ACSM-CEP, CCRP, Exercise Physiologist;Carlette Wilber Oliphant, Therapist, sports, BSN   ? Virtual Visit No   ? Medication changes reported     No   ? Fall or balance concerns reported    No   ? Tobacco Cessation No Change   ? Warm-up and Cool-down Performed as group-led instruction   ? Resistance Training Performed Yes   ? VAD Patient? No   ? PAD/SET Patient? No   ?  ? Pain Assessment  ? Currently in Pain? No/denies   ? Pain Score 0-No pain   ? Multiple Pain Sites No   ? ?  ?  ? ?  ? ? ?Capillary Blood Glucose: ?Results for orders placed or performed during the hospital encounter of 11/19/21 (from the past 24 hour(s))  ?Glucose, capillary     Status: Abnormal  ? Collection Time: 11/25/21 10:22 AM  ?Result Value Ref Range  ? Glucose-Capillary 199 (H) 70 - 99 mg/dL  ?Glucose, capillary     Status: Abnormal  ? Collection Time: 11/25/21 11:14 AM  ?Result Value Ref Range  ? Glucose-Capillary 127 (H) 70 - 99 mg/dL  ? ? ? Exercise Prescription Changes - 11/25/21 1025   ? ?  ? Response to Exercise  ? Blood Pressure (Admit) 96/60   ? Blood Pressure (Exercise) 122/80   ? Blood Pressure (Exit) 102/64   ? Heart Rate (Admit) 99 bpm   ? Heart Rate (Exercise) 111 bpm   ? Heart Rate (Exit) 90 bpm   ? Rating of Perceived Exertion (Exercise) 12   ?  Symptoms None   ? Comments Off to a great start with exercise.   ? Duration Continue with 30 min of aerobic exercise without signs/symptoms of physical distress.   ? Intensity THRR unchanged   ?  ? Progression  ? Progression Continue to progress workloads to maintain intensity without signs/symptoms of physical distress.   ? Average METs 2.9   ?  ? Resistance Training  ? Training Prescription Yes   ? Weight 4 lbs   ? Reps 10-15   ? Time 10 Minutes   ?  ? Interval Training  ? Interval Training No   ?  ? Bike  ? Level 2   ? Watts 30   ? Minutes 15   ? METs 3.4   ?  ? NuStep  ? Level 3   ? SPM 92   ? Minutes 15   ? METs 2.4   ? ?  ?  ? ?  ? ? ?Social History  ? ?Tobacco Use  ?Smoking Status Former  ? Packs/day: 0.10  ? Years: 3.00  ? Pack years: 0.30  ? Types: Cigarettes  ?Smokeless Tobacco Never  ? ? ?Goals Met:  ?Exercise tolerated well ?No report of concerns or symptoms today ?Strength training completed  today ? ?Goals Unmet:  ?Not Applicable ? ?Comments: [name] started cardiac rehab today.  Pt tolerated light exercise without difficulty. VSS, telemetry-Sinus Rhythm with frequent PVC, couplet and a triplet times 1, asymptomatic.Cecilie Kicks NP paged and notified. Mickel Baas said to call if patient has more than 4-5 beat runs of PVC's or if the patient becomes asymptomatic.  Medication list reconciled. Pt denies barriers to medicaiton compliance.  PSYCHOSOCIAL ASSESSMENT:  PHQ-0. Pt exhibits positive coping skills, hopeful outlook with supportive family. No psychosocial needs identified at this time, no psychosocial interventions necessary.    Pt enjoys pickle ball and playing golf. .   Pt oriented to exercise equipment and routine.    Understanding verbalized. Barnet Pall, RN,BSN ?11/26/2021 7:42 AM  ? ? ?Dr. Fransico Him is Medical Director for Cardiac Rehab at [facility]. ?

## 2021-11-25 NOTE — Telephone Encounter (Signed)
Cardiac rehab called for 3 beats of NSVT. Pt asymptomatic.   ? ?History of Ehlers-Danlos syndrome, mitral valve prolapse with regurgitation status post MVR, PVCs status post ablation. And MVR at [facility] 08/2021.   ? ?Dr. Curt Bears could you give any recommendations on how much NSVT if asymptomatic rehab should notify us?   I said in 5-6 beats asymptomatic and if nay symptoms of course to notify us.   Thanks for your help.  ?

## 2021-11-27 ENCOUNTER — Encounter (HOSPITAL_COMMUNITY)
Admission: RE | Admit: 2021-11-27 | Discharge: 2021-11-27 | Disposition: A | Discharge status: Home / Self Care | Payer: Medicare Other | Source: Ambulatory Visit | Attending: Cardiology | Admitting: Cardiology

## 2021-11-27 ENCOUNTER — Encounter: Payer: Self-pay | Admitting: Cardiology

## 2021-11-27 ENCOUNTER — Other Ambulatory Visit: Payer: Self-pay

## 2021-11-27 DIAGNOSIS — Z9889 Other specified postprocedural states: Secondary | ICD-10-CM

## 2021-11-27 LAB — GLUCOSE, CAPILLARY
Glucose-Capillary: 137 mg/dL — ABNORMAL HIGH (ref 70–99)
Glucose-Capillary: 188 mg/dL — ABNORMAL HIGH (ref 70–99)

## 2021-11-27 NOTE — Telephone Encounter (Signed)
Magda Kiel, RN  Shellia Cleverly, RN ?Good morning Pam I heard back from Willeen Cass she said that Dr Curt Bears reviewed the ECG tracings from cardiac rehab and he okay to proceed with exercise. Thanks  so  much for checking1  ? ?Sincerely,  ? ?Maria  ?

## 2021-11-29 ENCOUNTER — Encounter (HOSPITAL_COMMUNITY)
Admission: RE | Admit: 2021-11-29 | Discharge: 2021-11-29 | Disposition: A | Discharge status: Home / Self Care | Payer: Medicare Other | Source: Ambulatory Visit | Attending: Cardiology | Admitting: Cardiology

## 2021-11-29 DIAGNOSIS — Z9889 Other specified postprocedural states: Secondary | ICD-10-CM | POA: Diagnosis not present

## 2021-12-02 ENCOUNTER — Encounter (HOSPITAL_COMMUNITY)
Admission: RE | Admit: 2021-12-02 | Discharge: 2021-12-02 | Disposition: A | Discharge status: Home / Self Care | Payer: Medicare Other | Source: Ambulatory Visit | Attending: Cardiology | Admitting: Cardiology

## 2021-12-02 DIAGNOSIS — Z954 Presence of other heart-valve replacement: Secondary | ICD-10-CM | POA: Diagnosis not present

## 2021-12-02 DIAGNOSIS — I340 Nonrheumatic mitral (valve) insufficiency: Secondary | ICD-10-CM | POA: Insufficient documentation

## 2021-12-02 DIAGNOSIS — Z48812 Encounter for surgical aftercare following surgery on the circulatory system: Secondary | ICD-10-CM | POA: Diagnosis present

## 2021-12-02 DIAGNOSIS — Z9889 Other specified postprocedural states: Secondary | ICD-10-CM

## 2021-12-04 ENCOUNTER — Ambulatory Visit (INDEPENDENT_AMBULATORY_CARE_PROVIDER_SITE_OTHER): Payer: Medicare Other | Admitting: Cardiology

## 2021-12-04 ENCOUNTER — Encounter: Payer: Self-pay | Admitting: Cardiology

## 2021-12-04 ENCOUNTER — Encounter (HOSPITAL_COMMUNITY)
Admission: RE | Admit: 2021-12-04 | Discharge: 2021-12-04 | Disposition: A | Discharge status: Home / Self Care | Payer: Medicare Other | Source: Ambulatory Visit | Attending: Cardiology | Admitting: Cardiology

## 2021-12-04 DIAGNOSIS — I493 Ventricular premature depolarization: Secondary | ICD-10-CM

## 2021-12-04 DIAGNOSIS — Z9889 Other specified postprocedural states: Secondary | ICD-10-CM

## 2021-12-04 DIAGNOSIS — R55 Syncope and collapse: Secondary | ICD-10-CM

## 2021-12-04 DIAGNOSIS — Z48812 Encounter for surgical aftercare following surgery on the circulatory system: Secondary | ICD-10-CM | POA: Diagnosis not present

## 2021-12-04 DIAGNOSIS — I4892 Unspecified atrial flutter: Secondary | ICD-10-CM

## 2021-12-04 NOTE — Patient Instructions (Addendum)
Medication Instructions:  ?Please discontinue your Eliquis. ?Continue all other medications as listed. ? ?*If you need a refill on your cardiac medications before your next appointment, please call your pharmacy* ? ?Follow-Up: ?At [facility], you and your health needs are our priority.  As part of our continuing mission to provide you with exceptional heart care, we have created designated Provider Care Teams.  These Care Teams include your primary Cardiologist (physician) and Advanced Practice Providers (APPs -  Physician Assistants and Nurse Practitioners) who all work together to provide you with the care you need, when you need it. ? ?We recommend signing up for the patient portal called "MyChart".  Sign up information is provided on this After Visit Summary.  MyChart is used to connect with patients for Virtual Visits (Telemedicine).  Patients are able to view lab/test results, encounter notes, upcoming appointments, etc.  Non-urgent messages can be sent to your provider as well.   ?To learn more about what you can do with MyChart, go to [email or web address].   ? ?Your next appointment:   ?6 month(s) ? ?The format for your next appointment:   ?In Person ? ?Provider:   ?Candee Furbish, MD   ? ? ?Thank you for choosing [facility]!! ? ? ? ?

## 2021-12-04 NOTE — Assessment & Plan Note (Signed)
Post PVC ablation 2018 Dr. Curt Bears.  Used to be 20% PVCs.  Currently 4.3% on monitor. ?

## 2021-12-04 NOTE — Assessment & Plan Note (Signed)
No further episodes of syncope.  Doing well with cardiac rehab.  I think is reasonable at this point for him to drive.  Obviously if symptoms arise, he knows to discontinue. ?

## 2021-12-04 NOTE — Assessment & Plan Note (Signed)
No further recurrence.  Sinus rhythm, PVCs noted on ECG today.  Okay to stop Eliquis.  Obviously if atrial arrhythmia/atrial fibrillation or flutter were to return, we would need to reinitiate.  He would be at increased risk of stroke at that time. ?

## 2021-12-04 NOTE — Progress Notes (Signed)
?Cardiology Office Note:   ? ?Date:  12/04/2021  ? ?ID:  Terry Johns, DOB [birth year], MRN [id number] ? ?PCP:  [name], MD ?  ?[facility] Providers ?Cardiologist:  Candee Furbish, MD ?Electrophysiologist:  Will [name], MD    ? ?Referring MD: [name], MD  ? ? ?History of Present Illness:   ? ?Terry Johns is a 66 y.o. male here for follow up MV repair (95m Simulus, P2 triangular resection) via R thoracotomy on 08/08/21.  [facility] ? ?History of Ehlers-Danlos syndrome, PVCs status post ablation.  He presented to the hospital 07/27/2021 with an episode of syncope.  He was found to have severe mitral regurgitation.  [facility]  He presented to the hospital 08/25/2021 with syncope and rapid heart rate.  His wife reports that he fainted as he was walking to the bathroom.  EMS reported nonsustained VT when attempting to move the patient.  On presentation the emergency room, the patient was in atrial flutter with ventricular bigeminy.  He was given amiodarone and was in a 2-1 flutter.  He was cardioverted.  Since then his amiodarone was discontinued.  He has been doing well with cardiac rehab without any evidence of atrial arrhythmia.  PVCs are still present at times. ? 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  More recent cardiac monitor that showed 4.3% ventricular ectopy and a 20 beat run of ventricular tachycardia.  This episode occurred while he was sleeping.  He was completely asymptomatic while wearing the monitor.  Dr. CCurt Bearstold him to get a cardia mobile for further monitoring. ? ?Past Medical History:  ?Diagnosis Date  ? Cataract   ? [place]   ? DYSPHAGIA UNSPECIFIED 05/28/2010  ? Ehlers-Danlos disease   ? 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 EXTERNAL HEMORRHOIDS 05/17/2009  ?  MITRAL VALVE PROLAPSE 05/17/2009  ? ? ?Past Surgical History:  ?Procedure Laterality Date  ? CARDIAC CATHETERIZATION    ? COLONOSCOPY  2008  ? FEMORAL ARTERY EXPLORATION Right 12/09/2016  ? Procedure: Evacuation of Hematoma Right Groin, Repair of Right Superfiical Femoral Artery;  Surgeon: CAngelia Mould MD;  [place]  Service: Vascular;  Laterality: Right;  ? PVC ABLATION  12/08/2016  ? PVC ABLATION N/A 12/08/2016  ? Procedure: PVC Ablation;  Surgeon: Will MMeredith Leeds MD;  [facility];  Service: Cardiovascular;  Laterality: N/A;  ? RIGHT/LEFT HEART CATH AND CORONARY ANGIOGRAPHY N/A 07/29/2021  ? Procedure: RIGHT/LEFT HEART CATH AND CORONARY ANGIOGRAPHY;  Surgeon: MBurnell Blanks MD;  [facility];  Service: Cardiovascular;  Laterality: N/A;  ? SHOULDER ARTHROSCOPY WITH ROTATOR CUFF REPAIR Left 2017  ? [facility]  ? TEE WITHOUT CARDIOVERSION N/A 07/29/2021  ? Procedure: TRANSESOPHAGEAL ECHOCARDIOGRAM (TEE);  Surgeon: NJosue Hector MD;  [facility];  Service: Cardiovascular;  Laterality: N/A;  ? ? ?Current Medications: ?Current Meds  ?Medication Sig  ? ACCU-CHEK GUIDE test strip USE AS INSTRUCTED TO CHECK BLOOD SUGARS TWICE WEEKLY.  ? acetaminophen (TYLENOL) 500 MG tablet Take 500-1,000 mg by mouth every 6 (six) hours as needed (pain.).  ? aspirin EC 81 MG tablet Take 81 mg by mouth in the morning. Swallow whole.  ? metFORMIN (GLUCOPHAGE) 500 MG tablet TAKE 2 TABLETS BY MOUTH TWICE DAILY WITH MEALS  ? metoprolol succinate (TOPROL  XL) 100 MG 24 hr tablet Take 1 tablet (100 mg total) by mouth daily. Take with or immediately following a meal.  ? [DISCONTINUED] apixaban (ELIQUIS) 5 MG TABS tablet Take 1 tablet (5 mg total) by mouth 2 (two) times daily.  ?  ? ?Allergies:   Patient has no known allergies.  ? ?Social History  ? ?Socioeconomic History  ? Marital status: Married  ?  Spouse name: Not on file  ? Number of children: Not on file   ? Years of education: 26  ? Highest education level: Not on file  ?Occupational History  ? Not on file  ?Tobacco Use  ? Smoking status: Former  ?  Packs/day: 0.10  ?  Years: 3.00  ?  Pack years: 0.30  ?  Types: Cigarettes  ? Smokeless tobacco: Never  ?Vaping Use  ? Vaping Use: Never used  ?Substance and Sexual Activity  ? Alcohol use: No  ? Drug use: No  ? Sexual activity: Yes  ?Other Topics Concern  ? Not on file  ?Social History Narrative  ? Not on file  ? ?Social Determinants of Health  ? ?Financial Resource Strain: Low Risk   ? Difficulty of Paying Living Expenses: Not hard at all  ?Food Insecurity: No Food Insecurity  ? Worried About Charity fundraiser in the Last Year: Never true  ? Ran Out of Food in the Last Year: Never true  ?Transportation Needs: No Transportation Needs  ? Lack of Transportation (Medical): No  ? Lack of Transportation (Non-Medical): No  ?Physical Activity: Insufficiently Active  ? Days of Exercise per Week: 3 days  ? Minutes of Exercise per Session: 30 min  ?Stress: No Stress Concern Present  ? Feeling of Stress : Not at all  ?Social Connections: Socially Integrated  ? Frequency of Communication with Friends and Family: Twice a week  ? Frequency of Social Gatherings with Friends and Family: Twice a week  ? Attends Religious Services: More than 4 times per year  ? Active Member of Clubs or Organizations: Yes  ? Attends Archivist Meetings: 1 to 4 times per year  ? Marital Status: Married  ?  ? ?Family History: ?The patient's family history includes Breast cancer in his mother; Cancer in his father; Diabetes in his father; Hypertension in his father. There is no history of Colon cancer, Esophageal cancer, Rectal cancer, Stomach cancer, or Colon polyps. ? ?ROS:   ?Please see the history of present illness.    ? All other systems reviewed and are negative. ? ?EKGs/Labs/Other Studies Reviewed:   ? ?The following studies were reviewed today: ?ECHO 2023: ? ? 1. Left ventricular  ejection fraction, by estimation, is 40 to 45%. The  ?left ventricle has mildly decreased function. The left ventricle  ?demonstrates global hypokinesis. There is moderate left ventricular  ?hypertrophy. Left ventricular diastolic  ?parameters are indeterminate.  ? 2. Right ventricular systolic function is normal. The right ventricular  ?size is mildly enlarged. There is normal pulmonary artery systolic  ?pressure. The estimated right ventricular systolic pressure is 02.5 mmHg.  ? 3. Left atrial size was mildly dilated.  ? 4. Right atrial size was mildly dilated.  ? 5. There is a prosthetic annuloplasty ring present in the mitral position  ?    No evidence of mitral valve regurgitation  ?    Mild to moderate mitral stenosis typical post repair. The mean mitral valve gradient is  ?6.0 mmHg. MVA 1.7 cm^2 by continuity equation  ?  6. The aortic valve is tricuspid. Aortic valve regurgitation is not  ?visualized. Aortic valve sclerosis is present, with no evidence of aortic  ?valve stenosis.  ? 7. The inferior vena cava is normal in size with greater than 50%  ?respiratory variability, suggesting right atrial pressure of 3 mmHg.  ? ?Cath 2022: ?No angiographic evidence of CAD ?Normal LVEDP ?  ?Recommendations: No further ischemic workup.  ? ?PVC ablation in 2018 Dr. Curt Bears successful ? 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 LGE ?is as above. ? ?[place] 09/23/21: ?Predominant underlying rhythm was sinus rhythm ?1 run of ventricular tachycardia 20 beats at 226 bpm ?4.3% ventricular ectopy ?Less than 1% supraventricular ectopy ?No triggered episodes recorded ?  ?Will Curt Bears, MD ? ?EKG:  EKG is   ordered today.  The ekg ordered today demonstrates sinus rhythm 90 with 4 PVCs ? ?Recent Labs: ?07/28/2021: Magnesium 2.0; TSH 2.480 ?08/25/2021: ALT 12; BUN 26; Creatinine, Ser 0.70; Hemoglobin 12.6; Platelets  507; Potassium 4.1; Sodium 134  ?Recent Lipid Panel ?   ?Component Value Date/Time  ? CHOL 197 01/11/2020 1110  ? TRIG 91.0 01/11/2020 1110  ? HDL 64.00 01/11/2020 1110  ? CHOLHDL 3 01/11/2020 1110  ? VLDL 18.2

## 2021-12-04 NOTE — Assessment & Plan Note (Signed)
MV repair (79m Simulus, P2 triangular resection) via R thoracotomy on 08/08/21.  [facility]  Doing well.  Echocardiogram demonstrates solid repair.  Continue with aspirin.  Dental prophylaxis. ? ?

## 2021-12-04 NOTE — Assessment & Plan Note (Signed)
Blood pressure 104/76.  Be careful with sildenafil as this can decrease pressures more. ?

## 2021-12-06 ENCOUNTER — Encounter (HOSPITAL_COMMUNITY)
Admission: RE | Admit: 2021-12-06 | Discharge: 2021-12-06 | Disposition: A | Discharge status: Home / Self Care | Payer: Medicare Other | Source: Ambulatory Visit | Attending: Cardiology | Admitting: Cardiology

## 2021-12-06 DIAGNOSIS — Z48812 Encounter for surgical aftercare following surgery on the circulatory system: Secondary | ICD-10-CM | POA: Diagnosis not present

## 2021-12-06 DIAGNOSIS — Z9889 Other specified postprocedural states: Secondary | ICD-10-CM

## 2021-12-06 NOTE — Progress Notes (Signed)
Reviewed home exercise guidelines with patient including endpoints, temperature precautions, target heart rate and rate of perceived exertion. Patient is currently walking about 2 miles in 30-45 minutes once/week as his mode of home exercise. Discussed increasing days/week at home to achieve 150 minutes of aerobic exercise, and patient is amenable to this. Patient has a weight machine at home with variable weight that he is using for his resistance training. Patient voices understanding of instructions given. ? ?Sol Passer, MS, ACSM CEP ? ? ?

## 2021-12-09 ENCOUNTER — Encounter (HOSPITAL_COMMUNITY)
Admission: RE | Admit: 2021-12-09 | Discharge: 2021-12-09 | Disposition: A | Discharge status: Home / Self Care | Payer: Medicare Other | Source: Ambulatory Visit | Attending: Cardiology | Admitting: Cardiology

## 2021-12-09 DIAGNOSIS — Z9889 Other specified postprocedural states: Secondary | ICD-10-CM

## 2021-12-09 DIAGNOSIS — Z48812 Encounter for surgical aftercare following surgery on the circulatory system: Secondary | ICD-10-CM | POA: Diagnosis not present

## 2021-12-10 NOTE — Progress Notes (Signed)
Cardiac Individual Treatment Plan ? ?Patient Details  ?Name: Terry Johns ?MRN: [id number] ?Date of Birth: 1956-06-14 ?Referring Provider:   ?Flowsheet Row CARDIAC REHAB PHASE II ORIENTATION from 11/19/2021 in [place]  ?Referring Provider Constance Haw, MD  ? ?  ? ? ?Initial Encounter Date:  ?Flowsheet Row CARDIAC REHAB PHASE II ORIENTATION from 11/19/2021 in [place]  ?Date 11/19/21  ? ?  ? ? ?Visit Diagnosis: 08/08/21 MVR at [facility] Dr Cheree Ditto ? ?Patient's Home Medications on Admission: ? ?Current Outpatient Medications:  ?  ACCU-CHEK GUIDE test strip, USE AS INSTRUCTED TO CHECK BLOOD SUGARS TWICE WEEKLY., Disp: 200 strip, Rfl: 0 ?  acetaminophen (TYLENOL) 500 MG tablet, Take 500-1,000 mg by mouth every 6 (six) hours as needed (pain.)., Disp: , Rfl:  ?  aspirin EC 81 MG tablet, Take 81 mg by mouth in the morning. Swallow whole., Disp: , Rfl:  ?  metFORMIN (GLUCOPHAGE) 500 MG tablet, TAKE 2 TABLETS BY MOUTH TWICE DAILY WITH MEALS, Disp: 360 tablet, Rfl: 1 ?  metoprolol succinate (TOPROL XL) 100 MG 24 hr tablet, Take 1 tablet (100 mg total) by mouth daily. Take with or immediately following a meal., Disp: 90 tablet, Rfl: 3 ? ?Past Medical History: ?Past Medical History:  ?Diagnosis Date  ? Cataract   ? Diabetes mellitus without complication ([place])   ? DYSPHAGIA UNSPECIFIED 05/28/2010  ? Ehlers-Danlos disease   ? 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 EXTERNAL HEMORRHOIDS 05/17/2009  ? MITRAL VALVE PROLAPSE 05/17/2009  ? ? ?Tobacco Use: ?Social History  ? ?Tobacco Use  ?Smoking Status Former  ? Packs/day: 0.10  ? Years: 3.00  ? Pack years: 0.30  ? Types: Cigarettes  ?Smokeless Tobacco Never  ? ? ?Labs: ?Review Flowsheet   ? ?  ?  Latest Ref Rng & Units 01/11/2020 08/14/2020 02/12/2021 07/29/2021  ?Labs for ITP Cardiac and Pulmonary Rehab  ?Cholestrol 0 - 200 mg/dL 197       ?LDL (calc) 0 - 99 mg/dL 115       ?HDL-C >39.00 mg/dL 64.00       ?Trlycerides  0.0 - 149.0 mg/dL 91.0       ?Hemoglobin A1c 4.8 - 5.6 %  6.7   7.0   7.4    ?PH, Arterial 7.350 - 7.450    7.390    ?PCO2 arterial 32.0 - 48.0 mmHg    39.8    ?Bicarbonate 20.0 - 28.0 mmol/L    24.1    ? 25.9    ?TCO2 22 - 32 mmol/L    25    ? 27    ?Acid-base deficit 0.0 - 2.0 mmol/L    1.0    ?O2 Saturation %    97.0    ? 72.0    ? ?  08/25/2021  ?Labs for ITP Cardiac and Pulmonary Rehab  ?Cholestrol   ?LDL (calc)   ?HDL-C   ?Trlycerides   ?Hemoglobin A1c   ?PH, Arterial   ?PCO2 arterial   ?Bicarbonate   ?TCO2 29    ?Acid-base deficit   ?O2 Saturation   ?  ? ? Multiple values from one day are sorted in reverse-chronological order  ?  ?  ? ? ?Capillary Blood Glucose: ?Lab Results  ?Component Value Date  ? GLUCAP 137 (H) 11/27/2021  ? GLUCAP 188 (H) 11/27/2021  ? GLUCAP 127 (H) 11/25/2021  ? GLUCAP 199 (H) 11/25/2021  ? GLUCAP 280 (H) 11/19/2021  ? ? ? ?  Exercise Target Goals: ?Exercise Program Goal: ?Individual exercise prescription set using results from initial 6 min walk test and THRR while considering  patient?s activity barriers and safety.  ? ?Exercise Prescription Goal: ?Initial exercise prescription builds to 30-45 minutes a day of aerobic activity, 2-3 days per week.  Home exercise guidelines will be given to patient during program as part of exercise prescription that the participant will acknowledge. ? ?Activity Barriers & Risk Stratification: ? Activity Barriers & Cardiac Risk Stratification - 11/19/21 0956   ? ?  ? Activity Barriers & Cardiac Risk Stratification  ? Activity Barriers None   ? Cardiac Risk Stratification High   ? ?  ?  ? ?  ? ? ?6 Minute Walk: ? 6 Minute Walk   ? ? [place] Name 11/19/21 1006  ?  ?  ?  ? 6 Minute Walk  ? Phase Initial    ? Distance 1800 feet    ? Walk Time 6 minutes    ? # of Rest Breaks 0    ? MPH 3.41    ? METS 4.46    ? RPE 11    ? Perceived Dyspnea  0    ? VO2 Peak 15.61    ? Symptoms No    ? Resting HR 97 bpm    ? Resting BP 102/64    ? Resting Oxygen Saturation  96 %     ? Exercise Oxygen Saturation  during 6 min walk 94 %    ? Max Ex. HR 120 bpm    ? Max Ex. BP 142/70    ? 2 Minute Post BP 124/72    ? ?  ?  ? ?  ? ? ?Oxygen Initial Assessment: ? ? ?Oxygen Re-Evaluation: ? ? ?Oxygen Discharge (Final Oxygen Re-Evaluation): ? ? ?Initial Exercise Prescription: ? Initial Exercise Prescription - 11/19/21 1000   ? ?  ? Date of Initial Exercise RX and Referring Provider  ? Date 11/19/21   ? Referring Provider Constance Haw, MD   ? Expected Discharge Date 01/17/22   ?  ? Bike  ? Level 2   ? Minutes 15   ? METs 2.8   ?  ? NuStep  ? Level 3   ? SPM 85   ? Minutes 15   ? METs 2.8   ?  ? Prescription Details  ? Frequency (times per week) 3   ? Duration Progress to 30 minutes of continuous aerobic without signs/symptoms of physical distress   ?  ? Intensity  ? THRR 40-80% of Max Heartrate 62-123   ? Ratings of Perceived Exertion 11-13   ? Perceived Dyspnea 0-4   ?  ? Progression  ? Progression Continue to progress workloads to maintain intensity without signs/symptoms of physical distress.   ?  ? Resistance Training  ? Training Prescription Yes   ? Weight 4 lbs   ? Reps 10-15   ? ?  ?  ? ?  ? ? ?Perform Capillary Blood Glucose checks as needed. ? ?Exercise Prescription Changes: ? ? Exercise Prescription Changes   ? ? [place] Name 11/25/21 1025 12/09/21 1026  ?  ?  ?  ?  ? Response to Exercise  ? Blood Pressure (Admit) 96/60 120/70     ? Blood Pressure (Exercise) 122/80 158/70     ? Blood Pressure (Exit) 102/64 98/64     ? Heart Rate (Admit) 99 bpm 94 bpm     ? Heart Rate (Exercise) 111  bpm 134 bpm     ? Heart Rate (Exit) 90 bpm 99 bpm     ? Rating of Perceived Exertion (Exercise) 12 11     ? Symptoms None None     ? Comments Off to a great start with exercise. Increased WL on SE, increased weights.     ? Duration Continue with 30 min of aerobic exercise without signs/symptoms of physical distress. Continue with 30 min of aerobic exercise without signs/symptoms of physical distress.     ?  Intensity THRR unchanged THRR unchanged     ?  ? Progression  ? Progression Continue to progress workloads to maintain intensity without signs/symptoms of physical distress. Continue to progress workloads to maintain intensity without signs/symptoms of physical distress.     ? Average METs 2.9 4.3     ?  ? Resistance Training  ? Training Prescription Yes Yes     ? Weight 4 lbs 6 lbs     ? Reps 10-15 10-15     ? Time 10 Minutes 10 Minutes     ?  ? Interval Training  ? Interval Training No No     ?  ? Bike  ? Level 2 3.5     ? Watts 30 10     ? Minutes 15 15     ? METs 3.4 5.7     ?  ? NuStep  ? Level 3 4     ? SPM 92 92     ? Minutes 15 15     ? METs 2.4 3     ?  ? Home Exercise Plan  ? Plans to continue exercise at -- Home (comment)  Walking     ? Frequency -- Add 2 additional days to program exercise sessions.     ? Initial Home Exercises Provided -- 12/06/21     ? ?  ?  ? ?  ? ? ?Exercise Comments: ? ? Exercise Comments   ? ? [place] Name 11/25/21 1132 12/06/21 1035 12/09/21 1109  ?  ?  ? Exercise Comments Patient tolerated 1st session of exercise well without symptoms. Reviewed METs with patient. Reviewed home exercise guidelines, METs, and goals with patient. Reviewed METs with patient.    ? ?  ?  ? ?  ? ? ?Exercise Goals and Review: ? ? Exercise Goals   ? ? [place] Name 11/19/21 [phone]  ?  ?  ?  ?  ?  ? Exercise Goals  ? Increase Physical Activity Yes      ? Intervention Provide advice, education, support and counseling about physical activity/exercise needs.;Develop an individualized exercise prescription for aerobic and resistive training based on initial evaluation findings, risk stratification, comorbidities and participant's personal goals.      ? Expected Outcomes Short Term: Attend rehab on a regular basis to increase amount of physical activity.;Long Term: Exercising regularly at least 3-5 days a week.;Long Term: Add in home exercise to make exercise part of routine and to increase amount of physical activity.       ? Increase Strength and Stamina Yes      ? Intervention Provide advice, education, support and counseling about physical activity/exercise needs.;Develop an individualized exercise prescription for a

## 2021-12-11 ENCOUNTER — Encounter (HOSPITAL_COMMUNITY)
Admission: RE | Admit: 2021-12-11 | Discharge: 2021-12-11 | Disposition: A | Discharge status: Home / Self Care | Payer: Medicare Other | Source: Ambulatory Visit | Attending: Cardiology | Admitting: Cardiology

## 2021-12-11 DIAGNOSIS — Z9889 Other specified postprocedural states: Secondary | ICD-10-CM

## 2021-12-11 DIAGNOSIS — Z48812 Encounter for surgical aftercare following surgery on the circulatory system: Secondary | ICD-10-CM | POA: Diagnosis not present

## 2021-12-13 ENCOUNTER — Encounter (HOSPITAL_COMMUNITY)
Admission: RE | Admit: 2021-12-13 | Discharge: 2021-12-13 | Disposition: A | Discharge status: Home / Self Care | Payer: Medicare Other | Source: Ambulatory Visit | Attending: Cardiology | Admitting: Cardiology

## 2021-12-13 DIAGNOSIS — Z9889 Other specified postprocedural states: Secondary | ICD-10-CM

## 2021-12-13 DIAGNOSIS — Z48812 Encounter for surgical aftercare following surgery on the circulatory system: Secondary | ICD-10-CM | POA: Diagnosis not present

## 2021-12-16 ENCOUNTER — Encounter (HOSPITAL_COMMUNITY)
Admission: RE | Admit: 2021-12-16 | Discharge: 2021-12-16 | Disposition: A | Discharge status: Home / Self Care | Payer: Medicare Other | Source: Ambulatory Visit | Attending: Cardiology | Admitting: Cardiology

## 2021-12-16 DIAGNOSIS — Z9889 Other specified postprocedural states: Secondary | ICD-10-CM

## 2021-12-16 DIAGNOSIS — Z48812 Encounter for surgical aftercare following surgery on the circulatory system: Secondary | ICD-10-CM | POA: Diagnosis not present

## 2021-12-18 ENCOUNTER — Encounter (HOSPITAL_COMMUNITY)
Admission: RE | Admit: 2021-12-18 | Discharge: 2021-12-18 | Disposition: A | Discharge status: Home / Self Care | Payer: Medicare Other | Source: Ambulatory Visit | Attending: Cardiology | Admitting: Cardiology

## 2021-12-18 DIAGNOSIS — Z48812 Encounter for surgical aftercare following surgery on the circulatory system: Secondary | ICD-10-CM | POA: Diagnosis not present

## 2021-12-18 DIAGNOSIS — Z9889 Other specified postprocedural states: Secondary | ICD-10-CM

## 2021-12-20 ENCOUNTER — Encounter (HOSPITAL_COMMUNITY)
Admission: RE | Admit: 2021-12-20 | Discharge: 2021-12-20 | Disposition: A | Discharge status: Home / Self Care | Payer: Medicare Other | Source: Ambulatory Visit | Attending: Cardiology | Admitting: Cardiology

## 2021-12-20 DIAGNOSIS — Z9889 Other specified postprocedural states: Secondary | ICD-10-CM

## 2021-12-20 DIAGNOSIS — Z48812 Encounter for surgical aftercare following surgery on the circulatory system: Secondary | ICD-10-CM | POA: Diagnosis not present

## 2021-12-23 ENCOUNTER — Encounter (HOSPITAL_COMMUNITY)
Admission: RE | Admit: 2021-12-23 | Discharge: 2021-12-23 | Disposition: A | Discharge status: Home / Self Care | Payer: Medicare Other | Source: Ambulatory Visit | Attending: Cardiology | Admitting: Cardiology

## 2021-12-23 DIAGNOSIS — Z48812 Encounter for surgical aftercare following surgery on the circulatory system: Secondary | ICD-10-CM | POA: Diagnosis not present

## 2021-12-23 DIAGNOSIS — Z9889 Other specified postprocedural states: Secondary | ICD-10-CM

## 2021-12-25 ENCOUNTER — Encounter (HOSPITAL_COMMUNITY)
Admission: RE | Admit: 2021-12-25 | Discharge: 2021-12-25 | Disposition: A | Discharge status: Home / Self Care | Payer: Medicare Other | Source: Ambulatory Visit | Attending: Cardiology | Admitting: Cardiology

## 2021-12-25 DIAGNOSIS — Z48812 Encounter for surgical aftercare following surgery on the circulatory system: Secondary | ICD-10-CM | POA: Diagnosis not present

## 2021-12-25 DIAGNOSIS — Z9889 Other specified postprocedural states: Secondary | ICD-10-CM

## 2021-12-27 ENCOUNTER — Encounter (HOSPITAL_COMMUNITY)
Admission: RE | Admit: 2021-12-27 | Discharge: 2021-12-27 | Disposition: A | Discharge status: Home / Self Care | Payer: Medicare Other | Source: Ambulatory Visit | Attending: Cardiology | Admitting: Cardiology

## 2021-12-27 DIAGNOSIS — Z48812 Encounter for surgical aftercare following surgery on the circulatory system: Secondary | ICD-10-CM | POA: Diagnosis not present

## 2021-12-27 DIAGNOSIS — Z9889 Other specified postprocedural states: Secondary | ICD-10-CM

## 2021-12-30 ENCOUNTER — Encounter (HOSPITAL_COMMUNITY): Payer: Medicare Other

## 2022-01-01 ENCOUNTER — Encounter (HOSPITAL_COMMUNITY)
Admission: RE | Admit: 2022-01-01 | Discharge: 2022-01-01 | Disposition: A | Discharge status: Home / Self Care | Payer: Medicare Other | Source: Ambulatory Visit | Attending: Cardiology | Admitting: Cardiology

## 2022-01-01 ENCOUNTER — Other Ambulatory Visit: Payer: Self-pay | Admitting: Family Medicine

## 2022-01-01 DIAGNOSIS — Z9889 Other specified postprocedural states: Secondary | ICD-10-CM | POA: Insufficient documentation

## 2022-01-03 ENCOUNTER — Encounter (HOSPITAL_COMMUNITY)
Admission: RE | Admit: 2022-01-03 | Discharge: 2022-01-03 | Disposition: A | Discharge status: Home / Self Care | Payer: Medicare Other | Source: Ambulatory Visit | Attending: Cardiology | Admitting: Cardiology

## 2022-01-03 DIAGNOSIS — Z9889 Other specified postprocedural states: Secondary | ICD-10-CM

## 2022-01-06 ENCOUNTER — Encounter (HOSPITAL_COMMUNITY)
Admission: RE | Admit: 2022-01-06 | Discharge: 2022-01-06 | Disposition: A | Discharge status: Home / Self Care | Payer: Medicare Other | Source: Ambulatory Visit | Attending: Cardiology | Admitting: Cardiology

## 2022-01-06 DIAGNOSIS — Z9889 Other specified postprocedural states: Secondary | ICD-10-CM | POA: Diagnosis not present

## 2022-01-06 NOTE — Progress Notes (Signed)
Cardiac Individual Treatment Plan ? ?Patient Details  ?Johns: Terry Johns ?MRN: [id number] ?Date of Birth: 07-Jan-1956 ?Referring Provider:   ?Flowsheet Row CARDIAC REHAB PHASE II ORIENTATION from 11/19/2021 in [place]  ?Referring Provider Constance Haw, MD  ? ?  ? ? ?Initial Encounter Date:  ?Flowsheet Row CARDIAC REHAB PHASE II ORIENTATION from 11/19/2021 in [place]  ?Date 11/19/21  ? ?  ? ? ?Visit Diagnosis: 08/08/21 MVR at [facility] Dr Cheree Ditto ? ?Patient's Home Medications on Admission: ? ?Current Outpatient Medications:  ?  acetaminophen (TYLENOL) 500 MG tablet, Take 500-1,000 mg by mouth every 6 (six) hours as needed (pain.)., Disp: , Rfl:  ?  aspirin EC 81 MG tablet, Take 81 mg by mouth in the morning. Swallow whole., Disp: , Rfl:  ?  glucose blood (ACCU-CHEK GUIDE) test strip, Use as instructed to check blood sugars twice weekly. DX E11.65, Disp: 200 strip, Rfl: 0 ?  metFORMIN (GLUCOPHAGE) 500 MG tablet, TAKE 2 TABLETS BY MOUTH TWICE DAILY WITH MEALS, Disp: 360 tablet, Rfl: 1 ?  metoprolol succinate (TOPROL XL) 100 MG 24 hr tablet, Take 1 tablet (100 mg total) by mouth daily. Take with or immediately following a meal., Disp: 90 tablet, Rfl: 3 ? ?Past Medical History: ?Past Medical History:  ?Diagnosis Date  ? Cataract   ? Diabetes mellitus without complication ([place])   ? DYSPHAGIA UNSPECIFIED 05/28/2010  ? Ehlers-Danlos disease   ? 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 EXTERNAL HEMORRHOIDS 05/17/2009  ? MITRAL VALVE PROLAPSE 05/17/2009  ? ? ?Tobacco Use: ?Social History  ? ?Tobacco Use  ?Smoking Status Former  ? Packs/day: 0.10  ? Years: 3.00  ? Pack years: 0.30  ? Types: Cigarettes  ?Smokeless Tobacco Never  ? ? ?Labs: ?Review Flowsheet   ? ?  ?  Latest Ref Rng & Units 01/11/2020 08/14/2020 02/12/2021 07/29/2021  ?Labs for ITP Cardiac and Pulmonary Rehab  ?Cholestrol 0 - 200 mg/dL 197       ?LDL (calc) 0 - 99 mg/dL 115       ?HDL-C >39.00  mg/dL 64.00       ?Trlycerides 0.0 - 149.0 mg/dL 91.0       ?Hemoglobin A1c 4.8 - 5.6 %  6.7   7.0   7.4    ?PH, Arterial 7.350 - 7.450    7.390    ?PCO2 arterial 32.0 - 48.0 mmHg    39.8    ?Bicarbonate 20.0 - 28.0 mmol/L    24.1    ? 25.9    ?TCO2 22 - 32 mmol/L    25    ? 27    ?Acid-base deficit 0.0 - 2.0 mmol/L    1.0    ?O2 Saturation %    97.0    ? 72.0    ? ?  08/25/2021  ?Labs for ITP Cardiac and Pulmonary Rehab  ?Cholestrol   ?LDL (calc)   ?HDL-C   ?Trlycerides   ?Hemoglobin A1c   ?PH, Arterial   ?PCO2 arterial   ?Bicarbonate   ?TCO2 29    ?Acid-base deficit   ?O2 Saturation   ?  ? ? Multiple values from one day are sorted in reverse-chronological order  ?  ?  ? ? ?Capillary Blood Glucose: ?Lab Results  ?Component Value Date  ? GLUCAP 137 (H) 11/27/2021  ? GLUCAP 188 (H) 11/27/2021  ? GLUCAP 127 (H) 11/25/2021  ? GLUCAP 199 (H) 11/25/2021  ? GLUCAP 280 (  H) 11/19/2021  ? ? ? ?Exercise Target Goals: ?Exercise Program Goal: ?Individual exercise prescription set using results from initial 6 min walk test and THRR while considering  patient?s activity barriers and safety.  ? ?Exercise Prescription Goal: ?Initial exercise prescription builds to 30-45 minutes a day of aerobic activity, 2-3 days per week.  Home exercise guidelines will be given to patient during program as part of exercise prescription that the participant will acknowledge. ? ?Activity Barriers & Risk Stratification: ? Activity Barriers & Cardiac Risk Stratification - 11/19/21 0956   ? ?  ? Activity Barriers & Cardiac Risk Stratification  ? Activity Barriers None   ? Cardiac Risk Stratification High   ? ?  ?  ? ?  ? ? ?6 Minute Walk: ? 6 Minute Walk   ? ? Terry Johns 11/19/21 1006  ?  ?  ?  ? 6 Minute Walk  ? Phase Initial    ? Distance 1800 feet    ? Walk Time 6 minutes    ? # of Rest Breaks 0    ? MPH 3.41    ? METS 4.46    ? RPE 11    ? Perceived Dyspnea  0    ? VO2 Peak 15.61    ? Symptoms No    ? Resting HR 97 bpm    ? Resting BP 102/64    ?  Resting Oxygen Saturation  96 %    ? Exercise Oxygen Saturation  during 6 min walk 94 %    ? Max Ex. HR 120 bpm    ? Max Ex. BP 142/70    ? 2 Minute Post BP 124/72    ? ?  ?  ? ?  ? ? ?Oxygen Initial Assessment: ? ? ?Oxygen Re-Evaluation: ? ? ?Oxygen Discharge (Final Oxygen Re-Evaluation): ? ? ?Initial Exercise Prescription: ? Initial Exercise Prescription - 11/19/21 1000   ? ?  ? Date of Initial Exercise RX and Referring Provider  ? Date 11/19/21   ? Referring Provider Constance Haw, MD   ? Expected Discharge Date 01/17/22   ?  ? Bike  ? Level 2   ? Minutes 15   ? METs 2.8   ?  ? NuStep  ? Level 3   ? SPM 85   ? Minutes 15   ? METs 2.8   ?  ? Prescription Details  ? Frequency (times per week) 3   ? Duration Progress to 30 minutes of continuous aerobic without signs/symptoms of physical distress   ?  ? Intensity  ? THRR 40-80% of Max Heartrate 62-123   ? Ratings of Perceived Exertion 11-13   ? Perceived Dyspnea 0-4   ?  ? Progression  ? Progression Continue to progress workloads to maintain intensity without signs/symptoms of physical distress.   ?  ? Resistance Training  ? Training Prescription Yes   ? Weight 4 lbs   ? Reps 10-15   ? ?  ?  ? ?  ? ? ?Perform Capillary Blood Glucose checks as needed. ? ?Exercise Prescription Changes: ? ? Exercise Prescription Changes   ? ? Terry Johns 11/25/21 1025 12/09/21 1026 12/23/21 1024 01/06/22 1021  ?  ?  ? Response to Exercise  ? Blood Pressure (Admit) 96/60 120/70 110/80 108/70   ? Blood Pressure (Exercise) 122/80 158/70 178/88 128/76   ? Blood Pressure (Exit) 102/64 98/64 112/80 96/60   ? Heart Rate (Admit) 99 bpm 94 bpm 88 bpm 80  bpm   ? Heart Rate (Exercise) 111 bpm 134 bpm 137 bpm 127 bpm   ? Heart Rate (Exit) 90 bpm 99 bpm 88 bpm 90 bpm   ? Rating of Perceived Exertion (Exercise) '12 11 12 12   '$ ? Symptoms None None None None   ? Comments Off to a great start with exercise. Increased WL on SE, increased weights. -- --   ? Duration Continue with 30 min of aerobic  exercise without signs/symptoms of physical distress. Continue with 30 min of aerobic exercise without signs/symptoms of physical distress. Continue with 30 min of aerobic exercise without signs/symptoms of physical distress. Continue with 30 min of aerobic exercise without signs/symptoms of physical distress.   ? Intensity THRR unchanged THRR unchanged THRR unchanged THRR unchanged   ?  ? Progression  ? Progression Continue to progress workloads to maintain intensity without signs/symptoms of physical distress. Continue to progress workloads to maintain intensity without signs/symptoms of physical distress. Continue to progress workloads to maintain intensity without signs/symptoms of physical distress. Continue to progress workloads to maintain intensity without signs/symptoms of physical distress.   ? Average METs 2.9 4.3 5.3 5.7   ?  ? Resistance Training  ? Training Prescription Yes Yes Yes Yes   ? Weight 4 lbs 6 lbs 6 lbs 6 lbs   ? Reps 10-15 10-15 10-15 10-15   ? Time 10 Minutes 10 Minutes 10 Minutes 10 Minutes   ?  ? Interval Training  ? Interval Training No No No No   ?  ? Bike  ? Level 2 3.5 4 4.5   ? [place] 30 63 -- --   ? Minutes '15 15 15 15   '$ ? METs 3.4 5.7 6.4 6.1   ?  ? NuStep  ? Level '3 4 5 6   '$ ? SPM 92 92 92 92   ? Minutes '15 15 15 15   '$ ? METs 2.4 3 4.3 5.3   ?  ? Home Exercise Plan  ? Plans to continue exercise at -- Home (comment)  Walking Home (comment)  Walking Home (comment)  Walking   ? Frequency -- Add 2 additional days to program exercise sessions. Add 2 additional days to program exercise sessions. Add 2 additional days to program exercise sessions.   ? Initial Home Exercises Provided -- 12/06/21 12/06/21 12/06/21   ? ?  ?  ? ?  ? ? ?Exercise Comments: ? ? Exercise Comments   ? ? [place] Johns 11/25/21 1132 12/06/21 1035 12/09/21 1109 12/25/21 1040 01/06/22 1054  ? Exercise Comments Patient tolerated 1st session of exercise well without symptoms. Reviewed METs with patient. Reviewed home exercise  guidelines, METs, and goals with patient. Reviewed METs with patient. Reviewed METs with patient. Reviewed METs with patient.  ? ?  ?  ? ?  ? ? ?Exercise Goals and Review: ? ? Exercise Goals   ? ? [place] Johns 03/2

## 2022-01-07 ENCOUNTER — Ambulatory Visit (INDEPENDENT_AMBULATORY_CARE_PROVIDER_SITE_OTHER): Payer: Medicare Other | Admitting: Cardiology

## 2022-01-07 ENCOUNTER — Encounter: Payer: Self-pay | Admitting: Cardiology

## 2022-01-07 VITALS — BP 122/74 | HR 84 | Ht 73.5 in | Wt 196.4 lb

## 2022-01-07 DIAGNOSIS — I493 Ventricular premature depolarization: Secondary | ICD-10-CM

## 2022-01-07 NOTE — Patient Instructions (Signed)
Medication Instructions:  Your physician recommends that you continue on your current medications as directed. Please refer to the Current Medication list given to you today.  *If you need a refill on your cardiac medications before your next appointment, please call your pharmacy*   Lab Work: None ordered   Testing/Procedures: None ordered   Follow-Up: At [facility], you and your health needs are our priority.  As part of our continuing mission to provide you with exceptional heart care, we have created designated Provider Care Teams.  These Care Teams include your primary Cardiologist (physician) and Advanced Practice Providers (APPs -  Physician Assistants and Nurse Practitioners) who all work together to provide you with the care you need, when you need it.  Your next appointment:   6 month(s)  The format for your next appointment:   In Person  Provider:   Will Camnitz, MD    Thank you for choosing [facility]!!   Yamilex Borgwardt, RN [phone]  Other Instructions   Important Information About Sugar           

## 2022-01-07 NOTE — Progress Notes (Signed)
? ?Electrophysiology Office Note ? ? ?Date:  01/07/2022  ? ?ID:  Terry Johns, DOB 1955-11-26, MRN [id number] ? ?PCP:  Terry Post, MD  ?Cardiologist:  Terry Johns ?Primary Electrophysiologist:  Terry Vaquerano [name], MD   ? ?No chief complaint on file. ? ?  ?History of Present Illness: ?Terry Johns is a 66 y.o. male who presents today for electrophysiology evaluation.   Terry Johns is a 66 y.o. male who is being seen today for the evaluation of PVCs at the request of Terry Post, MD.  ? ?He has a history significant for either Danlos syndrome, mitral valve prolapse with regurgitation Johns MVR, PVCs Johns ablation.  He had mitral valve [facility] at [facility].  He represented to the hospital 08/25/2021 with syncope and rapid heart rates.  He was found to be in atrial flutter.  EMS also reported nonsustained VT.  It was thought that this was rapid atrial flutter.  He was started on amiodarone and was cardioverted.  Since then he has done well.  He has had no obvious arrhythmias.  He is overall comfortable with his control. ? ?Today, denies symptoms of palpitations, chest pain, shortness of breath, orthopnea, PND, lower extremity edema, claudication, dizziness, presyncope, syncope, bleeding, or neurologic sequela. The patient is tolerating medications without difficulties.   ? ? ? ?Past Medical History:  ?Diagnosis Date  ? Cataract   ? Diabetes mellitus without complication ([place])   ? DYSPHAGIA UNSPECIFIED 05/28/2010  ? Ehlers-Danlos disease   ? 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 EXTERNAL HEMORRHOIDS 05/17/2009  ? MITRAL VALVE PROLAPSE 05/17/2009  ? ?Past Surgical History:  ?Procedure Laterality Date  ? CARDIAC CATHETERIZATION    ? COLONOSCOPY  2008  ? FEMORAL ARTERY EXPLORATION Right 12/09/2016  ? Procedure: Evacuation of Hematoma Right Groin, Repair of Right Superfiical Femoral Artery;  Surgeon: Angelia Mould, MD;  Location: [facility];  Service: Vascular;  Laterality: Right;  ? PVC  ABLATION  12/08/2016  ? PVC ABLATION N/A 12/08/2016  ? Procedure: PVC Ablation;  Surgeon: Terry Hosmer [name], MD;  Location: [facility];  Service: Cardiovascular;  Laterality: N/A;  ? RIGHT/LEFT HEART CATH AND CORONARY ANGIOGRAPHY N/A 07/29/2021  ? Procedure: RIGHT/LEFT HEART CATH AND CORONARY ANGIOGRAPHY;  Surgeon: Terry Blanks, MD;  Location: [place] [facility];  Service: Cardiovascular;  Laterality: N/A;  ? SHOULDER ARTHROSCOPY WITH ROTATOR CUFF REPAIR Left 2017  ? "Terry Johns surgery"  ? TEE WITHOUT CARDIOVERSION N/A 07/29/2021  ? Procedure: TRANSESOPHAGEAL ECHOCARDIOGRAM (TEE);  Surgeon: Terry Hector, MD;  [facility] END[facility];  Service: Cardiovascular;  Laterality: N/A;  ? ? ? ?Current Outpatient Medications  ?Medication Sig Dispense Refill  ? acetaminophen (TYLENOL) 500 MG tablet Take 500-1,000 mg by mouth every 6 (six) hours as needed (pain.).    ? aspirin EC 81 MG tablet Take 81 mg by mouth in the morning. Swallow whole.    ? glucose blood (ACCU-CHEK GUIDE) test strip Use as instructed to check blood sugars twice weekly. DX E11.65 200 strip 0  ? metFORMIN (GLUCOPHAGE) 500 MG tablet TAKE 2 TABLETS BY MOUTH TWICE DAILY WITH MEALS 360 tablet 1  ? metoprolol succinate (TOPROL XL) 100 MG 24 hr tablet Take 1 tablet (100 mg total) by mouth daily. Take with or immediately following a meal. 90 tablet 3  ? Multiple Vitamin (MULTIVITAMIN) capsule Take 1 capsule by mouth daily.    ? ?No current facility-administered medications for this visit.  ? ? ?Allergies:  Patient has no known allergies.  ? ?Social History:  The patient  reports that he has quit smoking. His smoking use included cigarettes. He has a 0.30 pack-year smoking history. He has never used smokeless tobacco. He reports that he does not drink alcohol and does not use drugs.  ? ?Family History:  The patient's family history includes Breast cancer in his mother; Cancer in his father; Diabetes in his father; Hypertension in his  father.  ? ?ROS:  Please see the history of present illness.   Otherwise, review of systems is positive for none.   All other systems are reviewed and negative.  ? ?PHYSICAL EXAM: ?VS:  BP 122/74   Pulse 84   Ht 6' 1.5" (1.867 m)   Wt 196 lb 6.4 oz (89.1 kg)   SpO2 94%   BMI 25.56 kg/m?  , BMI Body mass index is 25.56 kg/m?. ?GEN: Well nourished, well developed, in no acute distress  ?HEENT: normal  ?Neck: no JVD, carotid bruits, or masses ?Cardiac: RRR; no murmurs, rubs, or gallops,no edema  ?Respiratory:  clear to auscultation bilaterally, normal work of breathing ?GI: soft, nontender, nondistended, + BS ?MS: no deformity or atrophy  ?Skin: warm and dry ?Neuro:  Strength and sensation are intact ?Psych: euthymic mood, full affect ? ?EKG:  EKG is ordered today. ?Personal review of the ekg ordered shows, PVCs ? ?Recent Labs: ?07/28/2021: Magnesium 2.0; TSH 2.480 ?08/25/2021: ALT 12; BUN 26; Creatinine, Ser 0.70; Hemoglobin 12.6; Platelets 507; Potassium 4.1; Sodium 134  ? ? ?Lipid Panel  ?   ?Component Value Date/Time  ? CHOL 197 01/11/2020 1110  ? TRIG 91.0 01/11/2020 1110  ? HDL 64.00 01/11/2020 1110  ? CHOLHDL 3 01/11/2020 1110  ? VLDL 18.2 01/11/2020 1110  ? [place] 115 (H) 01/11/2020 1110  ? ? ? ?Wt Readings from Last 3 Encounters:  ?01/07/22 196 lb 6.4 oz (89.1 kg)  ?12/04/21 192 lb (87.1 kg)  ?11/19/21 192 lb 0.3 oz (87.1 kg)  ?  ? ? ?Other studies Reviewed: ?Additional studies/ records that were reviewed today include:  ?TTE 10/07/21 ? 1. Left ventricular ejection fraction, by estimation, is 40 to 45%. The  ?left ventricle has mildly decreased function. The left ventricle  ?demonstrates global hypokinesis. There is moderate left ventricular  ?hypertrophy. Left ventricular diastolic  ?parameters are indeterminate.  ? 2. Right ventricular systolic function is normal. The right ventricular  ?size is mildly enlarged. There is normal pulmonary artery systolic  ?pressure. The estimated right ventricular  systolic pressure is 11.9 mmHg.  ? 3. Left atrial size was mildly dilated.  ? 4. Right atrial size was mildly dilated.  ? 5. There is a prosthetic annuloplasty ring present in the mitral position  ?    No evidence of mitral valve regurgitation  ?    Mild to moderate mitral stenosis. The mean mitral valve gradient is  ?6.0 mmHg. MVA 1.7 cm^2 by continuity equation  ? 6. The aortic valve is tricuspid. Aortic valve regurgitation is not  ?visualized. Aortic valve sclerosis is present, with no evidence of aortic  ?valve stenosis.  ? 7. The inferior vena cava is normal in size with greater than 50%  ?respiratory variability, suggesting right atrial pressure of 3 mmHg.  ? ?Cardiac monitor 09/23/2021 personally reviewed ?Predominant underlying rhythm was sinus rhythm ?1 run of ventricular tachycardia 20 beats at 226 bpm ?4.3% ventricular ectopy ?Less than 1% supraventricular ectopy ?No triggered episodes recorded ? ?ASSESSMENT AND PLAN: ? ?1.  PVCs:  20% on Holter monitor.  Johns ablation.  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  Most recent cardiac monitor showed a 4.3% ectopy burden and a 20 beat run of VT.  Episode occurred while he was sleeping.  He was completely asymptomatic while wearing the monitor. ? ?2.  mitral regurgitation: Status Johns MVR.  Plan per primary cardiology. ? ?3.  Atrial flutter: Underwent cardioversion in the emergency room.  CHA2DS2-VASc of 2.  He has been taken off of anticoagulation as his arrhythmia was potentially a consequence of his surgery.  No obvious recurrence. ? ?4.  Mild systolic heart failure: Currently on Toprol-XL 100 mg daily.  Plan per primary cardiology. ? ? ?Current medicines are reviewed at length with the patient today.   ?The patient does not have concerns regarding his medicines.  The following changes were made today: none ? ?Labs/ tests ordered today include:  ?No orders of the defined types were placed in this  encounter. ? ? ?Disposition:   FU with Ernesto Zukowski 6 months ? ?Signed, ?Khole Branch [name], MD  ?01/07/2022 2:56 PM    ? ?[facility] ?[address] ?[address] ?[place] [address] ?[phone] (office) ?[phone]

## 2022-01-08 ENCOUNTER — Encounter (HOSPITAL_COMMUNITY)
Admission: RE | Admit: 2022-01-08 | Discharge: 2022-01-08 | Disposition: A | Discharge status: Home / Self Care | Payer: Medicare Other | Source: Ambulatory Visit | Attending: Cardiology | Admitting: Cardiology

## 2022-01-08 DIAGNOSIS — Z9889 Other specified postprocedural states: Secondary | ICD-10-CM | POA: Diagnosis not present

## 2022-01-10 ENCOUNTER — Encounter (HOSPITAL_COMMUNITY)
Admission: RE | Admit: 2022-01-10 | Discharge: 2022-01-10 | Disposition: A | Discharge status: Home / Self Care | Payer: Medicare Other | Source: Ambulatory Visit | Attending: Cardiology | Admitting: Cardiology

## 2022-01-10 DIAGNOSIS — Z9889 Other specified postprocedural states: Secondary | ICD-10-CM | POA: Diagnosis not present

## 2022-01-13 ENCOUNTER — Encounter (HOSPITAL_COMMUNITY)
Admission: RE | Admit: 2022-01-13 | Discharge: 2022-01-13 | Disposition: A | Discharge status: Home / Self Care | Payer: Medicare Other | Source: Ambulatory Visit | Attending: Cardiology | Admitting: Cardiology

## 2022-01-13 DIAGNOSIS — Z9889 Other specified postprocedural states: Secondary | ICD-10-CM

## 2022-01-15 ENCOUNTER — Encounter (HOSPITAL_COMMUNITY)
Admission: RE | Admit: 2022-01-15 | Discharge: 2022-01-15 | Disposition: A | Discharge status: Home / Self Care | Payer: Medicare Other | Source: Ambulatory Visit | Attending: Cardiology | Admitting: Cardiology

## 2022-01-15 DIAGNOSIS — Z9889 Other specified postprocedural states: Secondary | ICD-10-CM

## 2022-01-15 NOTE — Progress Notes (Signed)
Discharge Progress Report  Patient Details  Name: Terry Johns MRN: [id number] Date of Birth: 09-Feb-1956 Referring Provider:   Flowsheet Row CARDIAC REHAB PHASE II ORIENTATION from 11/19/2021 in [place]  Referring Provider Constance Haw, MD        Number of Visits: 23  Reason for Discharge:  Patient reached a stable level of exercise. Patient independent in their exercise. Patient has met program and personal goals.  Smoking History:  Social History   Tobacco Use  Smoking Status Former   Packs/day: 0.10   Years: 3.00   Pack years: 0.30   Types: Cigarettes  Smokeless Tobacco Never    Diagnosis:  08/08/21 MVR at [facility] Dr Cheree Ditto  ADL UCSD:   Initial Exercise Prescription:  Initial Exercise Prescription - 11/19/21 1000       Date of Initial Exercise RX and Referring Provider   Date 11/19/21    Referring Provider Constance Haw, MD    Expected Discharge Date 01/17/22      Bike   Level 2    Minutes 15    METs 2.8      NuStep   Level 3    SPM 85    Minutes 15    METs 2.8      Prescription Details   Frequency (times per week) 3    Duration Progress to 30 minutes of continuous aerobic without signs/symptoms of physical distress      Intensity   THRR 40-80% of Max Heartrate 62-123    Ratings of Perceived Exertion 11-13    Perceived Dyspnea 0-4      Progression   Progression Continue to progress workloads to maintain intensity without signs/symptoms of physical distress.      Resistance Training   Training Prescription Yes    Weight 4 lbs    Reps 10-15             Discharge Exercise Prescription (Final Exercise Prescription Changes):  Exercise Prescription Changes - 01/17/22 1025       Response to Exercise   Blood Pressure (Admit) 98/62    Blood Pressure (Exercise) 154/84    Blood Pressure (Exit) 100/72    Heart Rate (Admit) 85 bpm    Heart Rate (Exercise) 142 bpm    Heart Rate (Exit) 93 bpm     Rating of Perceived Exertion (Exercise) 12    Symptoms None    Comments Last session of cardiac rehab.    Duration Continue with 30 min of aerobic exercise without signs/symptoms of physical distress.    Intensity THRR unchanged      Progression   Progression Continue to progress workloads to maintain intensity without signs/symptoms of physical distress.    Average METs 5.6      Resistance Training   Training Prescription Yes    Weight 6 lbs    Reps 10-15    Time 10 Minutes      Interval Training   Interval Training No      Bike   Level 4.5    Minutes 15    METs 6.5      NuStep   Level 6    SPM 92    Minutes 15    METs 4.8      Home Exercise Plan   Plans to continue exercise at [employer] (comment)   Walking at home and exercise at gym.   Frequency Add 2 additional days to program exercise sessions.  Initial Home Exercises Provided 12/06/21             Functional Capacity:  6 Minute Walk     Row Name 11/19/21 1006 01/10/22 1036       6 Minute Walk   Phase Initial Discharge    Distance 1800 feet 1966 feet    Distance % Change -- 9.22 %    Distance Feet Change -- 166 ft    Walk Time 6 minutes 6 minutes    # of Rest Breaks 0 0    MPH 3.41 3.72    METS 4.46 4.59    RPE 11 11    Perceived Dyspnea  0 0    VO2 Peak 15.61 16.06    Symptoms No No    Resting HR 97 bpm 89 bpm    Resting BP 102/64 112/60    Resting Oxygen Saturation  96 % --    Exercise Oxygen Saturation  during 6 min walk 94 % --    Max Ex. HR 120 bpm 119 bpm    Max Ex. BP 142/70 128/82    2 Minute Post BP 124/72 100/66             Psychological, QOL, Others - Outcomes: PHQ 2/9:    01/15/2022   11:50 AM 11/19/2021   12:24 PM 07/05/2021    1:20 PM 07/05/2021    1:17 PM 10/05/2019    9:19 AM  Depression screen PHQ 2/9  Decreased Interest 0 0 0 0 0  Down, Depressed, Hopeless 0 0 0 0 0  PHQ - 2 Score 0 0 0 0 0  Altered sleeping     0  Tired, decreased energy     0   Change in appetite     0  Feeling bad or failure about yourself      0  Trouble concentrating     0  Moving slowly or fidgety/restless     0  Suicidal thoughts     0  PHQ-9 Score     0  Difficult doing work/chores     Not difficult at all    Quality of Life:  Quality of Life - 01/08/22 1644       Quality of Life   Select Quality of Life      Quality of Life Scores   Health/Function Pre 22 %    Health/Function Post 24.2 %    Health/Function % Change 10 %    Socioeconomic Pre 27.71 %    Socioeconomic Post 27.21 %    Socioeconomic % Change  -1.8 %    Psych/Spiritual Pre 24.43 %    Psych/Spiritual Post 24.64 %    Psych/Spiritual % Change 0.86 %    Family Pre 26.4 %    Family Post 26.4 %    Family % Change 0 %    GLOBAL Pre 24.32 %    GLOBAL Post 25.24 %    GLOBAL % Change 3.78 %             Personal Goals: Goals established at orientation with interventions provided to work toward goal.  Personal Goals and Risk Factors at Admission - 11/19/21 1039       Core Components/Risk Factors/Patient Goals on Admission   Diabetes Yes    Intervention Provide education about signs/symptoms and action to take for hypo/hyperglycemia.;Provide education about proper nutrition, including hydration, and aerobic/resistive exercise prescription along with prescribed medications to achieve blood glucose in normal ranges: Fasting  glucose 65-99 mg/dL    Expected Outcomes Short Term: Participant verbalizes understanding of the signs/symptoms and immediate care of hyper/hypoglycemia, proper foot care and importance of medication, aerobic/resistive exercise and nutrition plan for blood glucose control.;Long Term: Attainment of HbA1C < 7%.              Personal Goals Discharge:  Goals and Risk Factor Review     Row Name 11/26/21 0754 12/10/21 1531 01/07/22 0805         Core Components/Risk Factors/Patient Goals Review   Personal Goals Review Diabetes Diabetes Diabetes     Review  Terry Johns started cardiac rehab on and did wel 11/26/21 and did well with exercise. Vital signs stable. Frequent PVC's patient asymptomatic Terry Johns has been doing well with exercise, vital signs  and CBG's have been stable. Terry Johns is off to a good start to exercise. Terry Johns has been doing well with exercise, vital signs  and CBG's have been stable. Terry Johns has increased his workloads and is enjoying participating in the program. Terry Johns will complete phase 2 cardiac rehab on 01/07/22.     Expected Outcomes Terry Johns will continue to participate in phase 2 cardiac rehab for exercise, nutrition and lifestyle modifications Terry Johns will continue to participate in phase 2 cardiac rehab for exercise, nutrition and lifestyle modifications Terry Johns will continue to participate in phase 2 cardiac rehab for exercise, nutrition and lifestyle modifications              Exercise Goals and Review:  Exercise Goals     Row Name 11/19/21 0942             Exercise Goals   Increase Physical Activity Yes       Intervention Provide advice, education, support and counseling about physical activity/exercise needs.;Develop an individualized exercise prescription for aerobic and resistive training based on initial evaluation findings, risk stratification, comorbidities and participant's personal goals.       Expected Outcomes Short Term: Attend rehab on a regular basis to increase amount of physical activity.;Long Term: Exercising regularly at least 3-5 days a week.;Long Term: Add in home exercise to make exercise part of routine and to increase amount of physical activity.       Increase Strength and Stamina Yes       Intervention Provide advice, education, support and counseling about physical activity/exercise needs.;Develop an individualized exercise prescription for aerobic and resistive training based on initial evaluation findings, risk stratification, comorbidities and participant's personal goals.       Expected Outcomes Short  Term: Increase workloads from initial exercise prescription for resistance, speed, and METs.;Short Term: Perform resistance training exercises routinely during rehab and add in resistance training at home;Long Term: Improve cardiorespiratory fitness, muscular endurance and strength as measured by increased METs and functional capacity (6MWT)       Able to understand and use rate of perceived exertion (RPE) scale Yes       Intervention Provide education and explanation on how to use RPE scale       Expected Outcomes Short Term: Able to use RPE daily in rehab to express subjective intensity level;Long Term:  Able to use RPE to guide intensity level when exercising independently       Knowledge and understanding of Target Heart Rate Range (THRR) Yes       Intervention Provide education and explanation of THRR including how the numbers were predicted and where they are located for reference       Expected Outcomes Short Term: Able  to state/look up THRR;Long Term: Able to use THRR to govern intensity when exercising independently;Short Term: Able to use daily as guideline for intensity in rehab       Able to check pulse independently Yes       Intervention Provide education and demonstration on how to check pulse in carotid and radial arteries.;Review the importance of being able to check your own pulse for safety during independent exercise       Expected Outcomes Short Term: Able to explain why pulse checking is important during independent exercise;Long Term: Able to check pulse independently and accurately       Understanding of Exercise Prescription Yes       Intervention Provide education, explanation, and written materials on patient's individual exercise prescription       Expected Outcomes Short Term: Able to explain program exercise prescription;Long Term: Able to explain home exercise prescription to exercise independently                Exercise Goals Re-Evaluation:  Exercise Goals  Re-Evaluation     Row Name 11/25/21 1351 12/06/21 1035 01/06/22 1054 01/08/22 1046 01/13/22 1146     Exercise Goal Re-Evaluation   Exercise Goals Review Increase Physical Activity;Able to understand and use rate of perceived exertion (RPE) scale;Increase Strength and Stamina Increase Physical Activity;Able to understand and use rate of perceived exertion (RPE) scale;Increase Strength and Stamina;Able to check pulse independently;Knowledge and understanding of Target Heart Rate Range (THRR);Understanding of Exercise Prescription Increase Physical Activity;Able to understand and use rate of perceived exertion (RPE) scale;Increase Strength and Stamina;Able to check pulse independently;Knowledge and understanding of Target Heart Rate Range (THRR);Understanding of Exercise Prescription Increase Physical Activity;Able to understand and use rate of perceived exertion (RPE) scale;Increase Strength and Stamina;Able to check pulse independently;Knowledge and understanding of Target Heart Rate Range (THRR);Understanding of Exercise Prescription Increase Physical Activity;Able to understand and use rate of perceived exertion (RPE) scale;Increase Strength and Stamina;Able to check pulse independently;Knowledge and understanding of Target Heart Rate Range (THRR);Understanding of Exercise Prescription   Comments Patient able to understand and use RPE scale appropriately. Patient is currently walking about 2 miles in 30-45 minutes once/week as his mode of home exercise. Discussed increasing days/week at home to achieve 150 minutes of aerobic exercise, and patient is amenable to this. Patient has a weight machine at home with variable weight that he is using for his resistance training. He's currenlty using 7.5 lb weights at home. Patient has a smart watch that he can use to monitor his heart rate. Patient's goal is to get back in shape without being short of breath and to regain muscle he lost after hospitalization. Increased  hand weights at cardiac rehab today from 5 to 6 lbs and tolerated well. Patient continues to progess well with exercise achieving 5.7 METs with exercise. Patient scheduled to complete cardiac rehab next week. Patient plans to continue exercise at gym 3 days/week riding stationary bike and using free weights and walking most other days. Patient is interested in exercising at [place], will send referral. Patient scheduled to complete cardiac rehab on Friday. Patient's strength increased 14% as measured by grip strength test, and patient feels that he benefitted from participation in the program. Patient plasn to continue exercise at [employer] and walk at home upon completion of the cardiac rehab program. In addition to aerobic training, patient will participate in circuit training use free weights and machines.   Expected Outcomes Continue to progress workloads as  tolerated to help increase strength. Continue to progress workloads as tolerated to help increase strength and decrease SOB with exertion. Continue to progress workloads as tolerated. Continue walking and at fitness center upon completion of the cardiac rehab program. Continue walking and exercise at fitness center upon completion of the cardiac rehab program.    [place] Name 01/17/22 1136             Exercise Goal Re-Evaluation   Exercise Goals Review Increase Physical Activity;Able to understand and use rate of perceived exertion (RPE) scale;Increase Strength and Stamina;Able to check pulse independently;Knowledge and understanding of Target Heart Rate Range (THRR);Understanding of Exercise Prescription       Comments Patient completed the cardiac rehab program and progressed well achieving 5.7 METs with exercise. Patient will continue walking daily and exercising at local gym.       Expected Outcomes Patient will exercise at least 30 minutes 5-7 days/week to maintain health and fitness gains.                Nutrition  & Weight - Outcomes:  Pre Biometrics - 11/19/21 0922       Pre Biometrics   Waist Circumference 39.25 inches    Hip Circumference 40.25 inches    Waist to Hip Ratio 0.98 %    Triceps Skinfold 12 mm    % Body Fat 25.3 %    Grip Strength 44 kg    Flexibility 16.5 in    Single Leg Stand 19.25 seconds             Post Biometrics - 01/17/22 1030        Post  Biometrics   Height 6' (1.829 m)    Waist Circumference 39.25 inches    Hip Circumference 41.5 inches    Waist to Hip Ratio 0.95 %    BMI (Calculated) 26.46    Triceps Skinfold 11 mm    % Body Fat 25 %    Grip Strength 50 kg    Flexibility 16.5 in    Single Leg Stand 14.5 seconds             Nutrition:  Nutrition Therapy & Goals - 01/17/22 1130       Nutrition Therapy   Diet Heart Healthy/Carbohydrate Consistent      Personal Nutrition Goals   Comments Patient graduates today. He remains motivated to continue to with lifestyle changes.      Intervention Plan   Intervention Prescribe, educate and counsel regarding individualized specific dietary modifications aiming towards targeted core components such as weight, hypertension, lipid management, diabetes, heart failure and other comorbidities.    Expected Outcomes Long Term Goal: Adherence to prescribed nutrition plan.             Nutrition Discharge:  Nutrition Assessments - 01/10/22 0944       Rate Your Plate Scores   Post Score 52             Education Questionnaire Score:  Knowledge Questionnaire Score - 01/08/22 1642       Knowledge Questionnaire Score   Pre Score 20/24    Post Score 20/24             Goals reviewed with patient; copy given to patient.Terry Johns graduated from cardiac rehab program on 01/17/22  with completion of 23 exercise sessions in Phase II. Pt maintained good attendance and progressed nicely during his participation in rehab as evidenced by increased MET level. Terry Johns increased his distance on  his post exercise  walk test by 166 feet.  Medication list reconciled. Repeat  PHQ score- 0 .  Pt has made significant lifestyle changes and should be commended for his success. Pt feels he has achieved his goals during cardiac rehab.   Pt plans to continue exercise at [employer] 3 days a week, walking and circuit training.Terry Johns may consider going to the [employer]. We are proud of Terry Johns's progress.Terry Gave RN BSN

## 2022-01-17 ENCOUNTER — Encounter (HOSPITAL_COMMUNITY)
Admission: RE | Admit: 2022-01-17 | Discharge: 2022-01-17 | Disposition: A | Discharge status: Home / Self Care | Payer: Medicare Other | Source: Ambulatory Visit | Attending: Cardiology | Admitting: Cardiology

## 2022-01-17 VITALS — BP 98/62 | HR 85 | Ht 72.0 in | Wt 195.1 lb

## 2022-01-17 DIAGNOSIS — Z9889 Other specified postprocedural states: Secondary | ICD-10-CM

## 2022-02-20 ENCOUNTER — Other Ambulatory Visit: Payer: Self-pay | Admitting: Family Medicine

## 2022-03-11 ENCOUNTER — Ambulatory Visit (INDEPENDENT_AMBULATORY_CARE_PROVIDER_SITE_OTHER): Payer: Medicare Other | Admitting: Family Medicine

## 2022-03-11 ENCOUNTER — Telehealth: Payer: Self-pay

## 2022-03-11 ENCOUNTER — Encounter: Payer: Self-pay | Admitting: Family Medicine

## 2022-03-11 VITALS — BP 118/68 | HR 76 | Temp 97.7°F | Ht 72.0 in | Wt 198.9 lb

## 2022-03-11 DIAGNOSIS — H109 Unspecified conjunctivitis: Secondary | ICD-10-CM

## 2022-03-11 DIAGNOSIS — B9689 Other specified bacterial agents as the cause of diseases classified elsewhere: Secondary | ICD-10-CM | POA: Diagnosis not present

## 2022-03-11 MED ORDER — POLYMYXIN B-TRIMETHOPRIM 10000-0.1 UNIT/ML-% OP SOLN: 2.0000 [drp] | OPHTHALMIC | 0 refills | Status: DC

## 2022-03-11 NOTE — Progress Notes (Signed)
Established Patient Office Visit  Subjective   Patient ID: Terry Johns, male    DOB: 11-10-1955  Age: 66 y.o. MRN: [id number]  Chief Complaint  Patient presents with   Eye Drainage    Patient complains of eye drainage, x2 months    HPI   Seen with some drainage especially from the right eye.  He states he has had some watery discharge for couple months but over the past few days has been waking up with crusted drainage especially over the right eye.  Lesser involvement of the left.  No blurred vision.  Some itching.  No significant pain.  Apparently his optometrist had called in some prednisolone drops which have not helped.  He shows a picture he took this morning and he had some definite purulent looking secretions in the right eye with conjunctival erythema.  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   EXTERNAL HEMORRHOIDS 05/17/2009   MITRAL VALVE PROLAPSE 05/17/2009   Past Surgical History:  Procedure Laterality Date   CARDIAC CATHETERIZATION     COLONOSCOPY  2008   FEMORAL ARTERY EXPLORATION Right 12/09/2016   Procedure: Evacuation of Hematoma Right Groin, Repair of Right Superfiical Femoral Artery;  Surgeon: Angelia Mould, MD;  Location: [facility];  Service: Vascular;  Laterality: Right;   PVC ABLATION  12/08/2016   PVC ABLATION N/A 12/08/2016   Procedure: PVC Ablation;  Surgeon: Will [name], MD;  Location: [facility];  Service: Cardiovascular;  Laterality: N/A;   RIGHT/LEFT HEART CATH AND CORONARY ANGIOGRAPHY N/A 07/29/2021   Procedure: RIGHT/LEFT HEART CATH AND CORONARY ANGIOGRAPHY;  Surgeon: Burnell Blanks, MD;  Location: [facility];  Service: Cardiovascular;  Laterality: N/A;   SHOULDER ARTHROSCOPY WITH ROTATOR CUFF REPAIR Left 2017   "[name]  surgery"   TEE WITHOUT CARDIOVERSION N/A 07/29/2021   Procedure: TRANSESOPHAGEAL ECHOCARDIOGRAM (TEE);  Surgeon: Josue Hector, MD;  Location: [facility];  Service: Cardiovascular;  Laterality: N/A;    reports that he has quit smoking. His smoking use included cigarettes. He has a 0.30 pack-year smoking history. He has never used smokeless tobacco. He reports that he does not drink alcohol and does not use drugs. family history includes Breast cancer in his mother; Cancer in his father; Diabetes in his father; Hypertension in his father. No Known Allergies  Review of Systems  Constitutional:  Negative for chills and fever.  Eyes:  Positive for discharge and redness. Negative for blurred vision and pain.      Objective:     BP 118/68 (BP Location: Left Arm, Patient Position: Sitting, Cuff Size: Normal)   Pulse 76   Temp 97.7 F (36.5 C) (Oral)   Ht 6' (1.829 m)   Wt 198 lb 14.4 oz (90.2 kg)   SpO2 97%   BMI 26.98 kg/m    Physical Exam Vitals reviewed.  Constitutional:      Appearance: Normal appearance.  Eyes:     Comments: Minimal yellow crusting right lower lid.  He has some mild conjunctival erythema right eye greater than left.  Otherwise eye exam normal  Pulmonary:     Effort: Pulmonary effort is normal.     Breath sounds: Normal breath sounds.  Neurological:     Mental Status: He is alert.  No results found for any visits on 03/11/22.    The 10-year ASCVD risk score (Arnett DK, et al., 2019) is: 19.1%    Assessment & Plan:   Problem List Items Addressed This Visit   None Visit Diagnoses     Bacterial conjunctivitis of both eyes    -  Primary     -Continue warm compresses several times daily -Start Polytrim ophthalmic drops 2 drops both eyes every 2-4 hours while awake -Be in touch if symptoms not resolving within 3 days -Leave off prednisolone drops  No follow-ups on file.    Carolann Littler, MD

## 2022-03-11 NOTE — Telephone Encounter (Signed)
Pt was here for OV on 7/11 and inquired how long will he need to be on Metformin as he is concerned about side effects?

## 2022-03-12 NOTE — Telephone Encounter (Signed)
Pt informed of the results and verbalized understanding. CPE scheduled

## 2022-03-17 ENCOUNTER — Encounter: Payer: Self-pay | Admitting: Family Medicine

## 2022-03-17 ENCOUNTER — Ambulatory Visit (INDEPENDENT_AMBULATORY_CARE_PROVIDER_SITE_OTHER): Payer: Medicare Other | Admitting: Family Medicine

## 2022-03-17 VITALS — BP 110/80 | HR 80 | Temp 97.5°F | Ht 72.05 in | Wt 196.5 lb

## 2022-03-17 DIAGNOSIS — Z0000 Encounter for general adult medical examination without abnormal findings: Secondary | ICD-10-CM

## 2022-03-17 DIAGNOSIS — Z23 Encounter for immunization: Secondary | ICD-10-CM

## 2022-03-17 DIAGNOSIS — Z125 Encounter for screening for malignant neoplasm of prostate: Secondary | ICD-10-CM

## 2022-03-17 DIAGNOSIS — E1165 Type 2 diabetes mellitus with hyperglycemia: Secondary | ICD-10-CM

## 2022-03-17 DIAGNOSIS — I4892 Unspecified atrial flutter: Secondary | ICD-10-CM | POA: Diagnosis not present

## 2022-03-17 LAB — PSA, MEDICARE: PSA: 1.26 ng/ml (ref 0.10–4.00)

## 2022-03-17 LAB — CBC WITH DIFFERENTIAL/PLATELET
Basophils Absolute: 0 10*3/uL (ref 0.0–0.1)
Basophils Relative: 0.8 % (ref 0.0–3.0)
Eosinophils Absolute: 0.2 10*3/uL (ref 0.0–0.7)
Eosinophils Relative: 2.8 % (ref 0.0–5.0)
HCT: 42 % (ref 39.0–52.0)
Hemoglobin: 14.5 g/dL (ref 13.0–17.0)
Lymphocytes Relative: 25.9 % (ref 12.0–46.0)
Lymphs Abs: 1.6 10*3/uL (ref 0.7–4.0)
MCHC: 34.4 g/dL (ref 30.0–36.0)
MCV: 84.4 fl (ref 78.0–100.0)
Monocytes Absolute: 0.5 10*3/uL (ref 0.1–1.0)
Monocytes Relative: 7.5 % (ref 3.0–12.0)
Neutro Abs: 3.8 10*3/uL (ref 1.4–7.7)
Neutrophils Relative %: 63 % (ref 43.0–77.0)
Platelets: 238 10*3/uL (ref 150.0–400.0)
RBC: 4.98 Mil/uL (ref 4.22–5.81)
RDW: 14.2 % (ref 11.5–15.5)
WBC: 6.1 10*3/uL (ref 4.0–10.5)

## 2022-03-17 LAB — HEMOGLOBIN A1C: Hgb A1c MFr Bld: 8.7 % — ABNORMAL HIGH (ref 4.6–6.5)

## 2022-03-17 LAB — BASIC METABOLIC PANEL
BUN: 16 mg/dL (ref 6–23)
CO2: 27 mEq/L (ref 19–32)
Calcium: 10.2 mg/dL (ref 8.4–10.5)
Chloride: 98 mEq/L (ref 96–112)
Creatinine, Ser: 0.89 mg/dL (ref 0.40–1.50)
GFR: 89.13 mL/min (ref >60.00)
Glucose, Bld: 212 mg/dL — ABNORMAL HIGH (ref 70–99)
Potassium: 5.6 mEq/L — ABNORMAL HIGH (ref 3.5–5.1)
Sodium: 136 mEq/L (ref 135–145)

## 2022-03-17 LAB — HEPATIC FUNCTION PANEL
ALT: 12 U/L (ref 0–53)
AST: 13 U/L (ref 0–37)
Albumin: 4.6 g/dL (ref 3.5–5.2)
Alkaline Phosphatase: 65 U/L (ref 39–117)
Bilirubin, Direct: 0.1 mg/dL (ref 0.0–0.3)
Total Bilirubin: 0.6 mg/dL (ref 0.2–1.2)
Total Protein: 7.7 g/dL (ref 6.0–8.3)

## 2022-03-17 LAB — LIPID PANEL
Cholesterol: 198 mg/dL (ref 0–200)
HDL: 68.6 mg/dL (ref >39.00)
LDL Cholesterol: 100 mg/dL — ABNORMAL HIGH (ref 0–99)
NonHDL: 129.66
Total CHOL/HDL Ratio: 3
Triglycerides: 147 mg/dL (ref 0.0–149.0)
VLDL: 29.4 mg/dL (ref 0.0–40.0)

## 2022-03-17 LAB — TSH: TSH: 1.53 u[IU]/mL (ref 0.35–5.50)

## 2022-03-17 NOTE — Progress Notes (Signed)
Established Patient Office Visit  Subjective   Patient ID: Terry Johns, male    DOB: September 14, 1955  Age: 66 y.o. MRN: [id number]  Chief Complaint  Patient presents with   Annual Exam    HPI   Here for medical follow-up.  He has history of Ehlers Danlos syndrome, type 2 diabetes, atrial flutter, PVCs, history of mitral valve repair.  He is maintained on metformin 500 mg 2 tablets twice daily and metoprolol extended release 100 mg daily.  Heart rate usually around 80 and stable.  He walks 2 to 3 miles several days per week.  No recent exercise intolerance.  No chest pains.  No dizziness.  Not monitoring blood sugars regularly.  Overdue for repeat A1c.  Health maintenance reviewed.  Needs Prevnar 20.  No history of shingles vaccine.  Colonoscopy up-to-date.  Plans get flu vaccine later.  Family history reviewed.  His father had lung cancer, diabetes type 2 and Parkinson's disease.  Mother had breast cancer.  He had 7 brothers and 5 sisters.  1 brother died of brain aneurysm.  Not clear if his brother had Ehlers-Danlos syndrome.  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   EXTERNAL HEMORRHOIDS 05/17/2009   MITRAL VALVE PROLAPSE 05/17/2009   Past Surgical History:  Procedure Laterality Date   CARDIAC CATHETERIZATION     COLONOSCOPY  2008   FEMORAL ARTERY EXPLORATION Right 12/09/2016   Procedure: Evacuation of Hematoma Right Groin, Repair of Right Superfiical Femoral Artery;  Surgeon: Angelia Mould, MD;  Location: [place];  Service: Vascular;  Laterality: Right;   PVC ABLATION  12/08/2016   PVC ABLATION N/A 12/08/2016   Procedure: PVC Ablation;  Surgeon: Will [name], MD;  Location: [place] [facility];  Service: Cardiovascular;  Laterality: N/A;   RIGHT/LEFT HEART CATH AND CORONARY ANGIOGRAPHY N/A 07/29/2021    Procedure: RIGHT/LEFT HEART CATH AND CORONARY ANGIOGRAPHY;  Surgeon: Burnell Blanks, MD;  Location: [place] [facility];  Service: Cardiovascular;  Laterality: N/A;   SHOULDER ARTHROSCOPY WITH ROTATOR CUFF REPAIR Left 2017   "[name]"   TEE WITHOUT CARDIOVERSION N/A 07/29/2021   Procedure: TRANSESOPHAGEAL ECHOCARDIOGRAM (TEE);  Surgeon: Josue Hector, MD;  Location[facility] ENDOSCOPY;  Service: Cardiovascular;  Laterality: N/A;    reports that he has quit smoking. His smoking use included cigarettes. He has a 0.30 pack-year smoking history. He has never used smokeless tobacco. He reports that he does not drink alcohol and does not use drugs. family history includes Breast cancer in his mother; Cancer in his father; Diabetes in his father; Hypertension in his father; Parkinson's disease in his father. No Known Allergies  Review of Systems  Constitutional:  Negative for malaise/fatigue.  Eyes:  Negative for blurred vision.  Respiratory:  Negative for shortness of breath.   Cardiovascular:  Negative for chest pain.  Gastrointestinal:  Negative for abdominal pain.  Genitourinary:  Negative for dysuria.  Neurological:  Negative for dizziness, weakness and headaches.      Objective:     BP 110/80 (BP Location: Left Arm, Patient Position: Sitting, Cuff Size: Normal)   Pulse 80   Temp (!) 97.5 F (36.4 C) (Oral)   Ht 6' 0.05" (1.83 m)   Wt 196 lb 8 oz (89.1 kg)   SpO2 98%   BMI 26.62 kg/m  BP  Readings from Last 3 Encounters:  03/17/22 110/80  03/11/22 118/68  01/17/22 98/62   Wt Readings from Last 3 Encounters:  03/17/22 196 lb 8 oz (89.1 kg)  03/11/22 198 lb 14.4 oz (90.2 kg)  01/17/22 195 lb 1.7 oz (88.5 kg)      Physical Exam Constitutional:      Appearance: He is well-developed.  HENT:     Right Ear: External ear normal.     Left Ear: External ear normal.  Eyes:     Pupils: Pupils are equal, round, and reactive to light.  Neck:     Thyroid: No  thyromegaly.  Cardiovascular:     Rate and Rhythm: Normal rate.     Comments: Occasional premature beat.  Mostly regular.  Rate around 80. Pulmonary:     Effort: Pulmonary effort is normal. No respiratory distress.     Breath sounds: Normal breath sounds. No wheezing or rales.  Abdominal:     Palpations: Abdomen is soft.     Tenderness: There is no abdominal tenderness. There is no guarding or rebound.  Musculoskeletal:     Cervical back: Neck supple.     Right lower leg: No edema.     Left lower leg: No edema.  Neurological:     Mental Status: He is alert and oriented to person, place, and time.      No results found for any visits on 03/17/22.    The 10-year ASCVD risk score (Arnett DK, et al., 2019) is: 17%    Assessment & Plan:   #1 type 2 diabetes.  Patient remains on metformin.  Due for follow-up labs including A1c and urine microalbumin.  Also needs to set up diabetic eye exam which he plans to do soon.  #2 history of atrial flutter.  Currently appears to be in sinus rhythm.  Patient on metoprolol extended release.  Continue close follow-up with cardiology.  #3 history of Ehlers-Danlos syndrome.  Status post mitral valve repair  #4 health maintenance--Prevnar 20 given.  Continue annual flu vaccine.  Discussed Shingrix vaccine and he will consider at pharmacy later this year   Return in about 6 months (around 09/17/2022).    Carolann Littler, MD

## 2022-03-18 ENCOUNTER — Telehealth: Payer: Self-pay

## 2022-03-18 LAB — MICROALBUMIN / CREATININE URINE RATIO
Creatinine,U: 101.6 mg/dL
Microalb Creat Ratio: 0.7 mg/g (ref 0.0–30.0)
Microalb, Ur: <0.7 mg/dL (ref 0.0–1.9)

## 2022-03-18 NOTE — Telephone Encounter (Signed)
Results note from 03/17/2022: Pt was informed of the results and verbalized understanding. Pt reported he would like to hold off on starting medication and would like to wait for 3 month follow up to recheck levels at that time.

## 2022-03-19 NOTE — Telephone Encounter (Signed)
Pt has been scheduled for 3 month f/u

## 2022-03-25 ENCOUNTER — Other Ambulatory Visit: Payer: Self-pay | Admitting: Family Medicine

## 2022-06-03 ENCOUNTER — Ambulatory Visit: Payer: Medicare Other | Attending: Cardiology | Admitting: Cardiology

## 2022-06-03 ENCOUNTER — Encounter: Payer: Self-pay | Admitting: Cardiology

## 2022-06-03 VITALS — BP 100/68 | HR 82 | Ht 72.0 in | Wt 195.0 lb

## 2022-06-03 DIAGNOSIS — I493 Ventricular premature depolarization: Secondary | ICD-10-CM | POA: Insufficient documentation

## 2022-06-03 DIAGNOSIS — I341 Nonrheumatic mitral (valve) prolapse: Secondary | ICD-10-CM | POA: Diagnosis present

## 2022-06-03 DIAGNOSIS — I4892 Unspecified atrial flutter: Secondary | ICD-10-CM | POA: Diagnosis present

## 2022-06-03 DIAGNOSIS — Z9889 Other specified postprocedural states: Secondary | ICD-10-CM | POA: Diagnosis present

## 2022-06-03 NOTE — Patient Instructions (Signed)
Medication Instructions:  The current medical regimen is effective;  continue present plan and medications.  *If you need a refill on your cardiac medications before your next appointment, please call your pharmacy*  Follow-Up: At [facility], you and your health needs are our priority.  As part of our continuing mission to provide you with exceptional heart care, we have created designated Provider Care Teams.  These Care Teams include your primary Cardiologist (physician) and Advanced Practice Providers (APPs -  Physician Assistants and Nurse Practitioners) who all work together to provide you with the care you need, when you need it.  We recommend signing up for the patient portal called "MyChart".  Sign up information is provided on this After Visit Summary.  MyChart is used to connect with patients for Virtual Visits (Telemedicine).  Patients are able to view lab/test results, encounter notes, upcoming appointments, etc.  Non-urgent messages can be sent to your provider as well.   To learn more about what you can do with MyChart, go to [email or web address].    Your next appointment:   1 year(s)  The format for your next appointment:   In Person  Provider:   Mark Skains, MD      Important Information About Sugar       

## 2022-06-03 NOTE — Progress Notes (Signed)
Cardiology Office Note:    Date:  06/03/2022   ID:  Terry Johns, DOB [birth year], MRN [id number]  PCP:  [name], MD   [facility] Providers Cardiologist:  Candee Furbish, MD Electrophysiologist:  Will [name], MD     Referring MD: [name], MD    History of Present Illness:    Terry Johns is a 66 y.o. male here for follow up MV repair (80m Simulus, P2 triangular resection) via R thoracotomy on 08/08/21.  [facility] He has a history significant for either Danlos syndrome, mitral valve prolapse with regurgitation post MVR, PVCs post ablation.   He was last seen by me on 12/04/21 and was doing well at that time. His Eliquis was d/c at that time as he had no further recurrence. He was then seen by Dr. CCurt Bearson 01/07/22 and was again doing well and had no medication changes made at that time.  Today, he reports that he is doing very well. He denies any awareness of palpitations or skipped beats. He denies any chest pain, syncope, or shortness of breath. He does have a mild cough as he is recovering from a head cold.  His blood pressure has been well controlled outside of the office.  He is compliant with his medication regimen without any complications.   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   EXTERNAL HEMORRHOIDS 05/17/2009   MITRAL VALVE PROLAPSE 05/17/2009    Past Surgical History:  Procedure Laterality Date   CARDIAC CATHETERIZATION     COLONOSCOPY  2008   FEMORAL ARTERY EXPLORATION Right 12/09/2016   Procedure: Evacuation of Hematoma Right Groin, Repair of Right Superfiical Femoral Artery;  Surgeon: CAngelia Mould MD;  [place]  Service: Vascular;  Laterality: Right;   PVC ABLATION  12/08/2016   PVC ABLATION N/A 12/08/2016    Procedure: PVC Ablation;  Surgeon: Will MMeredith Leeds MD;  [place];  Service: Cardiovascular;  Laterality: N/A;   RIGHT/LEFT HEART CATH AND CORONARY ANGIOGRAPHY N/A 07/29/2021   Procedure: RIGHT/LEFT HEART CATH AND CORONARY ANGIOGRAPHY;  Surgeon: MBurnell Blanks MD;  [facility];  Service: Cardiovascular;  Laterality: N/A;   SHOULDER ARTHROSCOPY WITH ROTATOR CUFF REPAIR Left 2017   "[name]"   TEE WITHOUT CARDIOVERSION N/A 07/29/2021   Procedure: TRANSESOPHAGEAL ECHOCARDIOGRAM (TEE);  Surgeon: NJosue Hector MD;  [facility];  Service: Cardiovascular;  Laterality: N/A;    Current Medications: Current Meds  Medication Sig   acetaminophen (TYLENOL) 500 MG tablet Take 500-1,000 mg by mouth every 6 (six) hours as needed (pain.).   aspirin EC 81 MG tablet Take 81 mg by mouth in the morning. Swallow whole.   glucose blood (ACCU-CHEK GUIDE) test strip Use as instructed to check blood sugars twice weekly. DX E11.65   metFORMIN (GLUCOPHAGE) 500 MG tablet TAKE 2 TABLETS BY MOUTH TWICE A DAY WITH MEALS   metoprolol succinate (TOPROL XL) 100 MG 24 hr tablet Take 1 tablet (100 mg total) by mouth daily. Take with or immediately following a meal.   Multiple Vitamin (MULTIVITAMIN) capsule Take 1 capsule by mouth daily.     Allergies:   Patient has no known allergies.   Social History   Socioeconomic History   Marital status:  Married    Spouse name: Not on file   Number of children: Not on file   Years of education: 16   Highest education level: Not on file  Occupational History   Not on file  Tobacco Use   Smoking status: Former    Packs/day: 0.10    Years: 3.00    Total pack years: 0.30    Types: Cigarettes   Smokeless tobacco: Never  Vaping Use   Vaping Use: Never used  Substance and Sexual Activity   Alcohol use: No   Drug use: No   Sexual activity: Yes  Other Topics Concern   Not on file  Social History  Narrative   Not on file   Social Determinants of Health   Financial Resource Strain: Low Risk  (07/05/2021)   Overall Financial Resource Strain (CARDIA)    Difficulty of Paying Living Expenses: Not hard at all  Food Insecurity: No Food Insecurity (07/05/2021)   Hunger Vital Sign    Worried About Running Out of Food in the Last Year: Never true    [place] in the Last Year: Never true  Transportation Needs: No Transportation Needs (07/05/2021)   PRAPARE - Hydrologist (Medical): No    Lack of Transportation (Non-Medical): No  Physical Activity: Insufficiently Active (07/05/2021)   Exercise Vital Sign    Days of Exercise per Week: 3 days    Minutes of Exercise per Session: 30 min  Stress: No Stress Concern Present (07/05/2021)   [place]    Feeling of Stress : Not at all  Social Connections: [place] (07/05/2021)   Social Connection and Isolation Panel [NHANES]    Frequency of Communication with Friends and Family: Twice a week    Frequency of Social Gatherings with Friends and Family: Twice a week    Attends Religious Services: More than 4 times per year    Active Member of [employer] or Organizations: Yes    Attends Archivist Meetings: 1 to 4 times per year    Marital Status: Married     Family History: The patient's family history includes Breast cancer in his mother; Cancer in his father; Diabetes in his father; Hypertension in his father; Parkinson's disease in his father. There is no history of Colon cancer, Esophageal cancer, Rectal cancer, Stomach cancer, or Colon polyps.  ROS:   Please see the history of present illness.     All other systems reviewed and are negative.  EKGs/Labs/Other Studies Reviewed:    The following studies were reviewed today: ECHO 2023:   1. Left ventricular ejection fraction, by estimation, is 40 to 45%. The  left ventricle has  mildly decreased function. The left ventricle  demonstrates global hypokinesis. There is moderate left ventricular  hypertrophy. Left ventricular diastolic  parameters are indeterminate.   2. Right ventricular systolic function is normal. The right ventricular  size is mildly enlarged. There is normal pulmonary artery systolic  pressure. The estimated right ventricular systolic pressure is 63.1 mmHg.   3. Left atrial size was mildly dilated.   4. Right atrial size was mildly dilated.   5. There is a prosthetic annuloplasty ring present in the mitral position      No evidence of mitral valve regurgitation      Mild to moderate mitral stenosis typical post repair. The mean mitral valve gradient is  6.0 mmHg. MVA 1.7 cm^2 by continuity equation  6. The aortic valve is tricuspid. Aortic valve regurgitation is not  visualized. Aortic valve sclerosis is present, with no evidence of aortic  valve stenosis.   7. The inferior vena cava is normal in size with greater than 50%  respiratory variability, suggesting right atrial pressure of 3 mmHg.   Cath 2022: No angiographic evidence of CAD Normal LVEDP   Recommendations: No further ischemic workup.   PVC ablation in 2018 Dr. 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 LGE is as above.  [place] 09/23/21: Predominant underlying rhythm was sinus rhythm 1 run of ventricular tachycardia 20 beats at 226 bpm 4.3% ventricular ectopy Less than 1% supraventricular ectopy No triggered episodes recorded     EKG:  EKG from 12/04/21 demonstrates sinus rhythm 90 with 4 PVCs   Recent Labs: 07/28/2021: Magnesium 2.0 03/17/2022: ALT 12; BUN 16; Creatinine, Ser 0.89; Hemoglobin 14.5; Platelets 238.0; Potassium 5.6; Sodium 136; TSH 1.53  Recent Lipid Panel    Component Value Date/Time   CHOL 198 03/17/2022 1349   TRIG  147.0 03/17/2022 1349   HDL 68.60 03/17/2022 1349   CHOLHDL 3 03/17/2022 1349   VLDL 29.4 03/17/2022 1349   LDLCALC 100 (H) 03/17/2022 1349   Lab Results  Component Value Date   HGBA1C 8.7 (H) 03/17/2022   Lab Results  Component Value Date   WBC 6.1 03/17/2022   HGB 14.5 03/17/2022   HCT 42.0 03/17/2022   MCV 84.4 03/17/2022   PLT 238.0 03/17/2022          Physical Exam:    VS:  BP 100/68 (BP Location: Left Arm, Patient Position: Sitting, Cuff Size: Normal)   Pulse 82   Ht 6' (1.829 m)   Wt 195 lb (88.5 kg)   SpO2 94%   BMI 26.45 kg/m     Wt Readings from Last 3 Encounters:  06/03/22 195 lb (88.5 kg)  03/17/22 196 lb 8 oz (89.1 kg)  03/11/22 198 lb 14.4 oz (90.2 kg)     GEN:  Well nourished, well developed in no acute distress HEENT: Normal NECK: No JVD; No carotid bruits LYMPHATICS: No lymphadenopathy CARDIAC: RRR, occasional ectopy, no murmurs, no rubs, gallops RESPIRATORY:  Clear to auscultation without rales, wheezing or rhonchi  ABDOMEN: Soft, non-tender, non-distended MUSCULOSKELETAL:  No edema; No deformity  SKIN: Warm and dry NEUROLOGIC:  Alert and oriented x 3 PSYCHIATRIC:  Normal affect   ASSESSMENT:    1. S/P MVR (mitral valve repair)   2. Frequent PVCs   3. Atrial flutter, unspecified type ([place])   4. Mitral valve prolapse     PLAN:    In order of problems listed above:  S/P mitral valve repair MV repair (62m Simulus, P2 triangular resection) via R thoracotomy on 08/08/21.  [facility]  Echocardiogram demonstrates solid repair.  Continue with aspirin.  Dental prophylaxis.  He does admit that he forgot his dental prophylaxis at his last visit.     PVC (premature ventricular contraction) Post PVC ablation 2018 Dr. CCurt Bears  Used to be 20% PVCs.  Currently 4.3% on monitor.  I do hear ectopy on exam.  Lets go ahead and continue with his Toprol-XL 100 mg a day.   Syncope No further episodes of syncope.  Doing well.  Feels fine.  Blood  pressure is on the lower end of normal.   [place]rial flutter ([place] No further recurrence.  Sinus  rhythm, PVCs noted on ECG today.  Okay to stop Eliquis.  Obviously if atrial arrhythmia/atrial fibrillation or flutter were to return, we would need to reinitiate.  He would be at increased risk of stroke at that time.  Continues to be stable.   ERECTILE DYSFUNCTION, ORGANIC Blood pressure 100/68.  Be careful with sildenafil as this can decrease pressures more.   Diabetes Could consider Jardiance in addition to metformin if hemoglobin A1c still remains elevated.  Last hemoglobin A1c was 8.7.  Creatinine 0.9 hemoglobin 14.5.  Would be optimal for him to be on statin therapy.  I think this was tried in the past potentially.  His LDL was 100.  Could consider Crestor 10 mg.   Follow up: 1 yr  Medication Adjustments/Labs and Tests Ordered: Current medicines are reviewed at length with the patient today.  Concerns regarding medicines are outlined above.  No orders of the defined types were placed in this encounter.  No orders of the defined types were placed in this encounter.   Patient Instructions  Medication Instructions:  The current medical regimen is effective;  continue present plan and medications.  *If you need a refill on your cardiac medications before your next appointment, please call your pharmacy*  Follow-Up: At [facility], you and your health needs are our priority.  As part of our continuing mission to provide you with exceptional heart care, we have created designated Provider Care Teams.  These Care Teams include your primary Cardiologist (physician) and Advanced Practice Providers (APPs -  Physician Assistants and Nurse Practitioners) who all work together to provide you with the care you need, when you need it.  We recommend signing up for the patient portal called "MyChart".  Sign up information is provided on this After Visit Summary.  MyChart is used to connect with  patients for Virtual Visits (Telemedicine).  Patients are able to view lab/test results, encounter notes, upcoming appointments, etc.  Non-urgent messages can be sent to your provider as well.   To learn more about what you can do with MyChart, go to [email or web address].    Your next appointment:   1 year(s)  The format for your next appointment:   In Person  Provider:   Candee Furbish, MD     Important Information About Sugar         This document serves as a record of services personally performed by Candee Furbish, MD. It was created on her behalf by Eugene Gavia, a trained medical scribe. The creation of this record is based on the scribe's personal observations and the provider's statements to them. This document has been checked and approved by the attending provider.  ICandee Furbish, MD, have reviewed all documentation for this visit. The documentation on 06/03/22 for the exam, diagnosis, procedures, and orders are all accurate and complete.   Signed, Candee Furbish, MD  06/03/2022 9:57 AM    [facility]

## 2022-06-12 ENCOUNTER — Telehealth: Payer: Self-pay | Admitting: Cardiology

## 2022-06-12 ENCOUNTER — Encounter: Payer: Self-pay | Admitting: Cardiology

## 2022-06-12 DIAGNOSIS — Z9889 Other specified postprocedural states: Secondary | ICD-10-CM

## 2022-06-12 NOTE — Telephone Encounter (Signed)
Dr [name] responded to pt's MyChart message and advised him to contact his surgeon at [facility].

## 2022-06-12 NOTE — Telephone Encounter (Signed)
Patient called to talk with Dr. [name] or nurse

## 2022-06-12 NOTE — Telephone Encounter (Signed)

## 2022-06-13 ENCOUNTER — Encounter: Payer: Self-pay | Admitting: Family Medicine

## 2022-06-13 ENCOUNTER — Ambulatory Visit (INDEPENDENT_AMBULATORY_CARE_PROVIDER_SITE_OTHER): Payer: Medicare Other

## 2022-06-13 ENCOUNTER — Ambulatory Visit (INDEPENDENT_AMBULATORY_CARE_PROVIDER_SITE_OTHER): Payer: Medicare Other | Admitting: Family Medicine

## 2022-06-13 VITALS — BP 100/70 | HR 63 | Temp 97.7°F | Ht 72.0 in | Wt 192.3 lb

## 2022-06-13 DIAGNOSIS — R059 Cough, unspecified: Secondary | ICD-10-CM

## 2022-06-13 DIAGNOSIS — J986 Disorders of diaphragm: Secondary | ICD-10-CM | POA: Diagnosis not present

## 2022-06-13 MED ORDER — AMOXICILLIN-POT CLAVULANATE 875-125 MG PO TABS: 1.0000 | ORAL_TABLET | Freq: Two times a day (BID) | ORAL | 0 refills | Status: DC

## 2022-06-13 MED ORDER — HYDROCODONE BIT-HOMATROP MBR 5-1.5 MG/5ML PO SOLN: 5.0000 mL | Freq: Three times a day (TID) | ORAL | 0 refills | Status: DC | PRN

## 2022-06-13 NOTE — Progress Notes (Signed)
Established Patient Office Visit  Subjective   Patient ID: Terry Johns, male    DOB: 1956-05-05  Age: 66 y.o. MRN: [id number]  Chief Complaint  Patient presents with   Cough    Patient complains of cough, Productive cough with greenish sputum, x4 weeks     HPI   Terry Johns is seen with almost 1 month history of cough.  He states he had COVID about a month ago.  Seem to be recovering from that but has had worsening cough past several days.  No fever.  Cough productive of green sputum intermittently.  No dyspnea.  Cough has been severe at times at night.  Not relieved with over-the-counter medication.  No known sick contacts.  He has history of Ehlers-Danlos syndrome.  Also type 2 diabetes and history of mitral valve repair.  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  He has pending follow-up with cardiothoracic surgery at [facility].  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   EXTERNAL HEMORRHOIDS 05/17/2009   MITRAL VALVE PROLAPSE 05/17/2009   Past Surgical History:  Procedure Laterality Date   CARDIAC CATHETERIZATION     COLONOSCOPY  2008   FEMORAL ARTERY EXPLORATION Right 12/09/2016   Procedure: Evacuation of Hematoma Right Groin, Repair of Right Superfiical Femoral Artery;  Surgeon: Angelia Mould, MD;  Location: [place];  Service: Vascular;  Laterality: Right;   PVC ABLATION  12/08/2016   PVC ABLATION N/A 12/08/2016   Procedure: PVC Ablation;  Surgeon: Will [name], MD;  Location: [place] [facility];  Service: Cardiovascular;  Laterality: N/A;   RIGHT/LEFT HEART CATH AND CORONARY ANGIOGRAPHY N/A 07/29/2021   Procedure: RIGHT/LEFT HEART CATH AND CORONARY ANGIOGRAPHY;  Surgeon: Burnell Blanks, MD;  Location: [place] [facility];  Service: Cardiovascular;  Laterality:  N/A;   SHOULDER ARTHROSCOPY WITH ROTATOR CUFF REPAIR Left 2017   "[name]"   TEE WITHOUT CARDIOVERSION N/A 07/29/2021   Procedure: TRANSESOPHAGEAL ECHOCARDIOGRAM (TEE);  Surgeon: Josue Hector, MD;  Location: [facility] ENDOSCOPY;  Service: Cardiovascular;  Laterality: N/A;    reports that he has quit smoking. His smoking use included cigarettes. He has a 0.30 pack-year smoking history. He has never used smokeless tobacco. He reports that he does not drink alcohol and does not use drugs. family history includes Breast cancer in his mother; Cancer in his father; Diabetes in his father; Hypertension in his father; Parkinson's disease in his father. No Known Allergies  Review of Systems  Constitutional:  Negative for chills and fever.  HENT:  Negative for congestion.   Respiratory:  Positive for cough and sputum production. Negative for hemoptysis and wheezing.   Cardiovascular:  Negative for chest pain.      Objective:     BP 100/70 (BP Location: Left Arm, Patient Position: Sitting, Cuff Size: Normal)   Pulse 63   Temp 97.7 F (36.5 C) (Oral)   Ht 6' (1.829 m)   Wt 192 lb 4.8 oz (87.2 kg)   SpO2 97%   BMI 26.08 kg/m    Physical Exam Vitals reviewed.  Constitutional:      Appearance: Normal appearance.  Cardiovascular:     Rate and Rhythm: Normal rate and regular rhythm.  Pulmonary:     Effort: Pulmonary effort is normal.     Comments:  He does have diminished breath sounds left base.  No wheezes.  No rales.  Normal breath sounds right lung Neurological:     Mental Status: He is alert.      No results found for any visits on 06/13/22.    The 10-year ASCVD risk score (Arnett DK, et al., 2019) is: 15.3%    Assessment & Plan:   Problem List Items Addressed This Visit   None Visit Diagnoses     Cough, unspecified type    -  Primary   Relevant Orders   DG Chest 2 View     Patient relates 1 month history of cough.  This is in the setting of recent COVID  over a month ago.  On exam we noted diminished breath sounds left base.  Chest x-ray obtained which shows elevated left hemidiaphragm.  After looking back, this is chronic and goes back at least 10 years.  No obvious effusion.  No obvious pneumonia.  Chest x-ray to be over read by radiology  -We wrote for limited Hycodan cough syrup 1 teaspoon nightly secondary to his severe cough -Augmentin 875 mg twice daily for 7 days -Open immediately for any fever or touch base for persistent cough if not clearing the next couple weeks  No follow-ups on file.    Terry Littler, MD

## 2022-06-18 ENCOUNTER — Ambulatory Visit: Payer: Medicare Other | Admitting: Family Medicine

## 2022-06-24 ENCOUNTER — Ambulatory Visit (INDEPENDENT_AMBULATORY_CARE_PROVIDER_SITE_OTHER): Payer: Medicare Other | Admitting: Family Medicine

## 2022-06-24 ENCOUNTER — Encounter: Payer: Self-pay | Admitting: Family Medicine

## 2022-06-24 VITALS — BP 128/70 | HR 105 | Temp 97.8°F | Ht 72.0 in | Wt 196.5 lb

## 2022-06-24 DIAGNOSIS — J209 Acute bronchitis, unspecified: Secondary | ICD-10-CM | POA: Diagnosis not present

## 2022-06-24 DIAGNOSIS — R059 Cough, unspecified: Secondary | ICD-10-CM

## 2022-06-24 MED ORDER — METHYLPREDNISOLONE ACETATE 80 MG/ML IJ SUSP
80.0000 mg | Freq: Once | INTRAMUSCULAR | Status: AC
  Administered 2022-06-24: 80 mg via INTRAMUSCULAR

## 2022-06-24 MED ORDER — ALBUTEROL SULFATE HFA 108 (90 BASE) MCG/ACT IN AERS: 2.0000 | INHALATION_SPRAY | Freq: Four times a day (QID) | RESPIRATORY_TRACT | 0 refills | Status: DC | PRN

## 2022-06-24 MED ORDER — HYDROCODONE BIT-HOMATROP MBR 5-1.5 MG/5ML PO SOLN: 5.0000 mL | Freq: Four times a day (QID) | ORAL | 0 refills | Status: DC | PRN

## 2022-06-24 NOTE — Progress Notes (Signed)
Established Patient Office Visit  Subjective   Patient ID: Terry Johns, male    DOB: 01-01-1956  Age: 66 y.o. MRN: [id number]  Chief Complaint  Patient presents with   Follow-up   Cough    Productive cough with yellowish sputum     HPI   Persistent cough.  COVID was seen here on the 13th and at that time had a few weeks of productive cough.  Chest x-ray showed no pneumonia.  He was treated with 7 days of Augmentin.  We had noticed perhaps slightly diminished breath sounds left base.  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  He has been using Hycodan cough syrup at night which is helping with nighttime cough but still having severe cough frequently during the day.  No obvious GERD symptoms.  Possibly some mild wheezing.  No fever.  No hemoptysis.  He completed the Augmentin.  He was in [place] basically all last week as his father-in-law is under hospice care and close to dying.  He does have type 2 diabetes with last A1c increasing to 8.7.  He declined Jardiance therapy at that time.  Still takes metformin.  Not monitoring regularly.  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   EXTERNAL HEMORRHOIDS 05/17/2009   MITRAL VALVE PROLAPSE 05/17/2009   Past Surgical History:  Procedure Laterality Date   CARDIAC CATHETERIZATION     COLONOSCOPY  2008   FEMORAL ARTERY EXPLORATION Right 12/09/2016   Procedure: Evacuation of Hematoma Right Groin, Repair of Right Superfiical Femoral Artery;  Surgeon: Angelia Mould, MD;  Location: [place];  Service: Vascular;  Laterality: Right;   PVC ABLATION  12/08/2016   PVC ABLATION N/A 12/08/2016   Procedure: PVC Ablation;  Surgeon: Will [name], MD;  Location: [place] [facility];  Service: Cardiovascular;  Laterality: N/A;   RIGHT/LEFT HEART CATH AND CORONARY ANGIOGRAPHY N/A 07/29/2021   Procedure: RIGHT/LEFT HEART CATH AND CORONARY ANGIOGRAPHY;  Surgeon: Burnell Blanks, MD;  Location: [place] [facility];  Service: Cardiovascular;  Laterality: N/A;   SHOULDER ARTHROSCOPY WITH ROTATOR CUFF REPAIR Left 2017   "Terry Johns surgery"   TEE WITHOUT CARDIOVERSION N/A 07/29/2021   Procedure: TRANSESOPHAGEAL ECHOCARDIOGRAM (TEE);  Surgeon: Josue Hector, MD;  Location: [facility] ENDOSCOPY;  Service: Cardiovascular;  Laterality: N/A;    reports that he has quit smoking. His smoking use included cigarettes. He has a 0.30 pack-year smoking history. He has never used smokeless tobacco. He reports that he does not drink alcohol and does not use drugs. family history includes Breast cancer in his mother; Cancer in his father; Diabetes in his father; Hypertension in his father; Parkinson's disease in his father. No Known Allergies  Review of Systems  Constitutional:  Negative for chills and fever.  Respiratory:  Positive for cough and sputum production. Negative for hemoptysis and shortness of breath.   Cardiovascular:  Negative for chest pain and leg swelling.  Gastrointestinal:  Negative for abdominal pain.      Objective:     BP 128/70 (BP Location: Left Arm, Patient Position: Sitting, Cuff Size: Normal)   Pulse (!) 105   Temp 97.8 F (36.6 C) (Oral)   Ht 6' (1.829 m)  Wt 196 lb 8 oz (89.1 kg)   SpO2 96%   BMI 26.65 kg/m  BP Readings from Last 3 Encounters:  06/24/22 128/70  06/13/22 100/70  06/03/22 100/68   Wt Readings from Last 3 Encounters:  06/24/22 196 lb 8 oz (89.1 kg)  06/13/22 192 lb 4.8 oz (87.2 kg)  06/03/22 195 lb (88.5 kg)      Physical Exam Vitals reviewed.  Constitutional:      Appearance: Normal appearance.  Cardiovascular:     Rate and Rhythm: Normal rate.  Pulmonary:     Effort: Pulmonary effort is normal.      Comments: Decreased breath sounds left base which is chronic and related to chronic elevated hemidiaphragm.  He does have a few faint scattered wheezes.  No rales.  No retractions.  Pulse oximetry 96% room air Musculoskeletal:     Right lower leg: No edema.     Left lower leg: No edema.  Neurological:     Mental Status: He is alert.      No results found for any visits on 06/24/22.    The 10-year ASCVD risk score (Arnett DK, et al., 2019) is: 22.6%    Assessment & Plan:   #1 persistent productive cough.  Suspect viral.  Recent chest x-ray showed no pneumonia.  No respiratory distress.  May have mild reactive airway component based on exam.  -Follow-up immediately for any fever or increased shortness of breath -Depo-Medrol 80 mg IM given.  He is aware this may exacerbate blood sugar short-term -Albuterol MDI 2 puffs every 4-6 hours as needed for cough and wheeze -Refill Hycodan cough syrup 1 teaspoon every 6 hours as needed for severe cough -Follow-up if cough not improving next 2 to 3 weeks  #2 type 2 diabetes.  Suboptimal control by most recent A1c.  He declines A1c testing today but agrees to setting up 6-week follow-up.  Recommend consideration for SGLT2 or GLP-1 medication at that time if not further improved  Return in about 6 weeks (around 08/05/2022).    Carolann Littler, MD

## 2022-06-24 NOTE — Patient Instructions (Signed)
Set up 6 weeks follow up to reassess diabetes  Follow up immediately for any fever or increased shortness of breath.

## 2022-06-27 LAB — HEMOGLOBIN A1C

## 2022-07-02 ENCOUNTER — Telehealth: Payer: Self-pay | Admitting: Family Medicine

## 2022-07-02 NOTE — Telephone Encounter (Signed)
Left message for patient to call back and schedule Medicare Annual Wellness Visit (AWV) either virtually or in office. Left  my Terry Johns number [phone]   Last AWV 07/05/21 please schedule with Nurse Health Adviser   45 min for awv-i and in office appointments 30 min for awv-s  phone/virtual appointments

## 2022-07-08 ENCOUNTER — Ambulatory Visit (INDEPENDENT_AMBULATORY_CARE_PROVIDER_SITE_OTHER): Payer: Medicare Other

## 2022-07-08 VITALS — Ht 73.83 in | Wt 196.0 lb

## 2022-07-08 DIAGNOSIS — Z0000 Encounter for general adult medical examination without abnormal findings: Secondary | ICD-10-CM

## 2022-07-08 NOTE — Patient Instructions (Addendum)
Terry Johns , Thank you for taking time to come for your Medicare Wellness Visit. I appreciate your ongoing commitment to your health goals. Please review the following plan we discussed and let me know if I can assist you in the future.   These are the goals we discussed:  Goals       Exercise 3x per week (30 min per time)      Stay Healthy (pt-stated)        This is a list of the screening recommended for you and due dates:  Health Maintenance  Topic Date Due   Eye exam for diabetics  07/08/2022*   Complete foot exam   07/09/2022*   COVID-19 Vaccine (4 - Pfizer risk series) 07/24/2022*   Zoster (Shingles) Vaccine (1 of 2) 10/08/2022*   Flu Shot  11/30/2022*   Hemoglobin A1C  09/17/2022   Yearly kidney function blood test for diabetes  03/18/2023   Yearly kidney health urinalysis for diabetes  03/18/2023   Medicare Annual Wellness Visit  07/09/2023   Tetanus Vaccine  06/09/2027   Colon Cancer Screening  06/15/2027   Pneumonia Vaccine  Completed   Hepatitis C Screening: USPSTF Recommendation to screen - Ages 18-79 yo.  Completed   HPV Vaccine  Aged Out  *Topic was postponed. The date shown is not the original due date.  Opioid Pain Medicine Management Opioids are powerful medicines that are used to treat moderate to severe pain. When used for short periods of time, they can help you to: Sleep better. Do better in physical or occupational therapy. Feel better in the first few days after an injury. Recover from surgery. Opioids should be taken with the supervision of a trained health care provider. They should be taken for the shortest period of time possible. This is because opioids can be addictive, and the longer you take opioids, the greater your risk of addiction. This addiction can also be called opioid use disorder. What are the risks? Using opioid pain medicines for longer than 3 days increases your risk of side effects. Side effects include: Constipation. Nausea and  vomiting. Breathing difficulties (respiratory depression). Drowsiness. Confusion. Opioid use disorder. Itching. Taking opioid pain medicine for a long period of time can affect your ability to do daily tasks. It also puts you at risk for: Motor vehicle crashes. Depression. Suicide. Heart attack. Overdose, which can be life-threatening. What is a pain treatment plan? A pain treatment plan is an agreement between you and your health care provider. Pain is unique to each person, and treatments vary depending on your condition. To manage your pain, you and your health care provider need to work together. To help you do this: Discuss the goals of your treatment, including how much pain you might expect to have and how you will manage the pain. Review the risks and benefits of taking opioid medicines. Remember that a good treatment plan uses more than one approach and minimizes the chance of side effects. Be honest about the amount of medicines you take and about any drug or alcohol use. Get pain medicine prescriptions from only one health care provider. Pain can be managed with many types of alternative treatments. Ask your health care provider to refer you to one or more specialists who can help you manage pain through: Physical or occupational therapy. Counseling (cognitive behavioral therapy). Good nutrition. Biofeedback. Massage. Meditation. Non-opioid medicine. Following a gentle exercise program. How to use opioid pain medicine Taking medicine Take your pain medicine exactly  as told by your health care provider. Take it only when you need it. If your pain gets less severe, you may take less than your prescribed dose if your health care provider approves. If you are not having pain, do nottake pain medicine unless your health care provider tells you to take it. If your pain is severe, do nottry to treat it yourself by taking more pills than instructed on your prescription. Contact  your health care provider for help. Write down the times when you take your pain medicine. It is easy to become confused while on pain medicine. Writing the time can help you avoid overdose. Take other over-the-counter or prescription medicines only as told by your health care provider. Keeping yourself and others safe  While you are taking opioid pain medicine: Do not drive, use machinery, or power tools. Do not sign legal documents. Do not drink alcohol. Do not take sleeping pills. Do not supervise children by yourself. Do not do activities that require climbing or being in high places. Do not go to a lake, river, ocean, spa, or swimming pool. Do not share your pain medicine with anyone. Keep pain medicine in a locked cabinet or in a secure area where pets and children cannot reach it. Stopping your use of opioids If you have been taking opioid medicine for more than a few weeks, you may need to slowly decrease (taper) how much you take until you stop completely. Tapering your use of opioids can decrease your risk of symptoms of withdrawal, such as: Pain and cramping in the abdomen. Nausea. Sweating. Sleepiness. Restlessness. Uncontrollable shaking (tremors). Cravings for the medicine. Do not attempt to taper your use of opioids on your own. Talk with your health care provider about how to do this. Your health care provider may prescribe a step-down schedule based on how much medicine you are taking and how long you have been taking it. Getting rid of leftover pills Do not save any leftover pills. Get rid of leftover pills safely by: Taking the medicine to a prescription take-back program. This is usually offered by the county or law enforcement. Bringing them to a pharmacy that has a drug disposal container. Flushing them down the toilet. Check the label or package insert of your medicine to see whether this is safe to do. Throwing them out in the trash. Check the label or package  insert of your medicine to see whether this is safe to do. If it is safe to throw it out, remove the medicine from the original container, put it into a sealable bag or container, and mix it with used coffee grounds, food scraps, dirt, or cat litter before putting it in the trash. Follow these instructions at home: Activity Do exercises as told by your health care provider. Avoid activities that make your pain worse. Return to your normal activities as told by your health care provider. Ask your health care provider what activities are safe for you. General instructions You may need to take these actions to prevent or treat constipation: Drink enough fluid to keep your urine pale yellow. Take over-the-counter or prescription medicines. Eat foods that are high in fiber, such as beans, whole grains, and fresh fruits and vegetables. Limit foods that are high in fat and processed sugars, such as fried or sweet foods. Keep all follow-up visits. This is important. Where to find support If you have been taking opioids for a long time, you may benefit from receiving support for quitting from  a local support group or counselor. Ask your health care provider for a referral to these resources in your area. Where to find more information Centers for Disease Control and Prevention (CDC): http://www.wolf.info/ [place] Food and Drug Administration (FDA): [place] Get help right away if: You may have taken too much of an opioid (overdosed). Common symptoms of an overdose: Your breathing is slower or more shallow than normal. You have a very slow heartbeat (pulse). You have slurred speech. You have nausea and vomiting. Your pupils become very small. You have other potential symptoms: You are very confused. You faint or feel like you will faint. You have cold, clammy skin. You have blue lips or fingernails. You have thoughts of harming yourself or harming others. These symptoms may represent a serious problem  that is an emergency. Do not wait to see if the symptoms will go away. Get medical help right away. Call your local emergency services (911 in the [place] Do not drive yourself to the hospital.  If you ever feel like you may hurt yourself or others, or have thoughts about taking your own life, get help right away. Go to your nearest emergency department or: Call your local emergency services (911 in the [place] Call the [facility] 201 367 9384 in the [place] Call a suicide crisis helpline, such as the [facility] at 272-227-0378 or 988 in the [employer]. This is open 24 hours a day in the [place] Text the Crisis Text Line at (820)428-4843 (in the [place].). Summary Opioid medicines can help you manage moderate to severe pain for a short period of time. A pain treatment plan is an agreement between you and your health care provider. Discuss the goals of your treatment, including how much pain you might expect to have and how you will manage the pain. If you think that you or someone else may have taken too much of an opioid, get medical help right away. This information is not intended to replace advice given to you by your health care provider. Make sure you discuss any questions you have with your health care provider. Document Revised: 03/13/2021 Document Reviewed: 11/28/2020 Elsevier Patient Education  [place] directives: Please bring a copy of your health care power of attorney and living will to the office to be added to your chart at your convenience.   Conditions/risks identified: None  Next appointment: Follow up in one year for your annual wellness visit.    Preventive Care 4 Years and Older, Male  Preventive care refers to lifestyle choices and visits with your health care provider that can promote health and wellness. What does preventive care include? A yearly physical exam. This is also called an annual well check. Dental exams  once or twice a year. Routine eye exams. Ask your health care provider how often you should have your eyes checked. Personal lifestyle choices, including: Daily care of your teeth and gums. Regular physical activity. Eating a healthy diet. Avoiding tobacco and drug use. Limiting alcohol use. Practicing safe sex. Taking low doses of aspirin every day. Taking vitamin and mineral supplements as recommended by your health care provider. What happens during an annual well check? The services and screenings done by your health care provider during your annual well check will depend on your age, overall health, lifestyle risk factors, and family history of disease. Counseling  Your health care provider may ask you questions about your: Alcohol use. Tobacco use. Drug use. Emotional well-being.  Home and relationship well-being. Sexual activity. Eating habits. History of falls. Memory and ability to understand (cognition). Work and work Statistician. Screening  You may have the following tests or measurements: Height, weight, and BMI. Blood pressure. Lipid and cholesterol levels. These may be checked every 5 years, or more frequently if you are over 36 years old. Skin check. Lung cancer screening. You may have this screening every year starting at age 48 if you have a 30-pack-year history of smoking and currently smoke or have quit within the past 15 years. Fecal occult blood test (FOBT) of the stool. You may have this test every year starting at age 64. Flexible sigmoidoscopy or colonoscopy. You may have a sigmoidoscopy every 5 years or a colonoscopy every 10 years starting at age 100. Prostate cancer screening. Recommendations will vary depending on your family history and other risks. Hepatitis C blood test. Hepatitis B blood test. Sexually transmitted disease (STD) testing. Diabetes screening. This is done by checking your blood sugar (glucose) after you have not eaten for a while  (fasting). You may have this done every 1-3 years. Abdominal aortic aneurysm (AAA) screening. You may need this if you are a current or former smoker. Osteoporosis. You may be screened starting at age 40 if you are at high risk. Talk with your health care provider about your test results, treatment options, and if necessary, the need for more tests. Vaccines  Your health care provider may recommend certain vaccines, such as: Influenza vaccine. This is recommended every year. Tetanus, diphtheria, and acellular pertussis (Tdap, Td) vaccine. You may need a Td booster every 10 years. Zoster vaccine. You may need this after age 60. Pneumococcal 13-valent conjugate (PCV13) vaccine. One dose is recommended after age 33. Pneumococcal polysaccharide (PPSV23) vaccine. One dose is recommended after age 37. Talk to your health care provider about which screenings and vaccines you need and how often you need them. This information is not intended to replace advice given to you by your health care provider. Make sure you discuss any questions you have with your health care provider. Document Released: 09/14/2015 Document Revised: 05/07/2016 Document Reviewed: 06/19/2015 Elsevier Interactive Patient Education  2017 [place] Prevention in the Home Falls can cause injuries. They can happen to people of all ages. There are many things you can do to make your home safe and to help prevent falls. What can I do on the outside of my home? Regularly fix the edges of walkways and driveways and fix any cracks. Remove anything that might make you trip as you walk through a door, such as a raised step or threshold. Trim any bushes or trees on the path to your home. Use bright outdoor lighting. Clear any walking paths of anything that might make someone trip, such as rocks or tools. Regularly check to see if handrails are loose or broken. Make sure that both sides of any steps have handrails. Any raised  decks and porches should have guardrails on the edges. Have any leaves, snow, or ice cleared regularly. Use sand or salt on walking paths during winter. Clean up any spills in your garage right away. This includes oil or grease spills. What can I do in the bathroom? Use night lights. Install grab bars by the toilet and in the tub and shower. Do not use towel bars as grab bars. Use non-skid mats or decals in the tub or shower. If you need to sit down in the shower, use a plastic, non-slip  stool. Keep the floor dry. Clean up any water that spills on the floor as soon as it happens. Remove soap buildup in the tub or shower regularly. Attach bath mats securely with double-sided non-slip rug tape. Do not have throw rugs and other things on the floor that can make you trip. What can I do in the bedroom? Use night lights. Make sure that you have a light by your bed that is easy to reach. Do not use any sheets or blankets that are too big for your bed. They should not hang down onto the floor. Have a firm chair that has side arms. You can use this for support while you get dressed. Do not have throw rugs and other things on the floor that can make you trip. What can I do in the kitchen? Clean up any spills right away. Avoid walking on wet floors. Keep items that you use a lot in easy-to-reach places. If you need to reach something above you, use a strong step stool that has a grab bar. Keep electrical cords out of the way. Do not use floor polish or wax that makes floors slippery. If you must use wax, use non-skid floor wax. Do not have throw rugs and other things on the floor that can make you trip. What can I do with my stairs? Do not leave any items on the stairs. Make sure that there are handrails on both sides of the stairs and use them. Fix handrails that are broken or loose. Make sure that handrails are as long as the stairways. Check any carpeting to make sure that it is firmly attached  to the stairs. Fix any carpet that is loose or worn. Avoid having throw rugs at the top or bottom of the stairs. If you do have throw rugs, attach them to the floor with carpet tape. Make sure that you have a light switch at the top of the stairs and the bottom of the stairs. If you do not have them, ask someone to add them for you. What else can I do to help prevent falls? Wear shoes that: Do not have high heels. Have rubber bottoms. Are comfortable and fit you well. Are closed at the toe. Do not wear sandals. If you use a stepladder: Make sure that it is fully opened. Do not climb a closed stepladder. Make sure that both sides of the stepladder are locked into place. Ask someone to hold it for you, if possible. Clearly mark and make sure that you can see: Any grab bars or handrails. First and last steps. Where the edge of each step is. Use tools that help you move around (mobility aids) if they are needed. These include: Canes. Walkers. Scooters. Crutches. Turn on the lights when you go into a dark area. Replace any light bulbs as soon as they burn out. Set up your furniture so you have a clear path. Avoid moving your furniture around. If any of your floors are uneven, fix them. If there are any pets around you, be aware of where they are. Review your medicines with your doctor. Some medicines can make you feel dizzy. This can increase your chance of falling. Ask your doctor what other things that you can do to help prevent falls. This information is not intended to replace advice given to you by your health care provider. Make sure you discuss any questions you have with your health care provider. Document Released: 06/14/2009 Document Revised: 01/24/2016 Document Reviewed: 09/22/2014  Chartered certified accountant Patient Education  [employer].

## 2022-07-08 NOTE — Progress Notes (Signed)
Subjective:   Terry Johns is a 66 y.o. male who presents for Medicare Annual/Subsequent preventive examination.  Review of Systems    Virtual Visit via Telephone Note  I connected with  Terry Johns on 07/08/22 at  2:00 PM EST by telephone and verified that I am speaking with the correct person using two identifiers.  Location: Patient: Home Provider: Office Persons participating in the virtual visit: patient/Nurse Health Advisor   I discussed the limitations, risks, security and privacy concerns of performing an evaluation and management service by telephone and the availability of in person appointments. The patient expressed understanding and agreed to proceed.  Interactive audio and video telecommunications were attempted between this nurse and patient, however failed, due to patient having technical difficulties OR patient did not have access to video capability.  We continued and completed visit with audio only.  Some vital signs may be absent or patient reported.   Terry Peaches, LPN  Cardiac Risk Factors include: advanced age (>21mn, >>63women);diabetes mellitus;male gender     Objective:    Today's Vitals   07/08/22 1413  Weight: 196 lb (88.9 kg)  Height: 6' 1.83" (1.875 m)   Body mass index is 25.28 kg/m.     07/08/2022    2:21 PM 08/25/2021    4:49 PM 08/01/2021    5:39 PM 07/27/2021    5:51 PM 07/05/2021    1:18 PM 01/21/2017   11:36 AM 12/30/2016    2:21 PM  Advanced Directives  Does Patient Have a Medical Advance Directive? Yes No No No Yes No No  Type of [place]    [place]    Does patient want to make changes to medical advance directive?   No - Patient declined      Copy of [place] Chart? No - copy requested    No - copy requested    Would patient like information on creating a medical advance directive?  No - Patient declined No - Patient  declined No - Patient declined       Current Medications (verified) Outpatient Encounter Medications as of 07/08/2022  Medication Sig   acetaminophen (TYLENOL) 500 MG tablet Take 500-1,000 mg by mouth every 6 (six) hours as needed (pain.).   aspirin EC 81 MG tablet Take 81 mg by mouth in the morning. Swallow whole.   glucose blood (ACCU-CHEK GUIDE) test strip Use as instructed to check blood sugars twice weekly. DX E11.65   HYDROcodone bit-homatropine (HYCODAN) 5-1.5 MG/5ML syrup Take 5 mLs by mouth every 6 (six) hours as needed for cough.   metFORMIN (GLUCOPHAGE) 500 MG tablet TAKE 2 TABLETS BY MOUTH TWICE A DAY WITH MEALS   metoprolol succinate (TOPROL XL) 100 MG 24 hr tablet Take 1 tablet (100 mg total) by mouth daily. Take with or immediately following a meal.   [DISCONTINUED] albuterol (VENTOLIN HFA) 108 (90 Base) MCG/ACT inhaler Inhale 2 puffs into the lungs every 6 (six) hours as needed for wheezing or shortness of breath.   [DISCONTINUED] Multiple Vitamin (MULTIVITAMIN) capsule Take 1 capsule by mouth daily.   No facility-administered encounter medications on file as of 07/08/2022.    Allergies (verified) Patient has no known allergies.   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   EXTERNAL HEMORRHOIDS 05/17/2009   MITRAL VALVE PROLAPSE 05/17/2009   Past Surgical History:  Procedure Laterality Date   CARDIAC CATHETERIZATION     COLONOSCOPY  2008   FEMORAL ARTERY EXPLORATION Right 12/09/2016   Procedure: Evacuation of Hematoma Right Groin, Repair of Right Superfiical Femoral Artery;  Surgeon: Angelia Mould, MD;  Location: [facility];  Service: Vascular;  Laterality: Right;   PVC ABLATION  12/08/2016   PVC ABLATION N/A 12/08/2016   Procedure: PVC Ablation;  Surgeon: Will [name], MD;  Location: [facility];  Service: Cardiovascular;  Laterality: N/A;   RIGHT/LEFT HEART CATH AND CORONARY ANGIOGRAPHY N/A 07/29/2021   Procedure: RIGHT/LEFT HEART CATH AND CORONARY ANGIOGRAPHY;  Surgeon: Burnell Blanks, MD;  Location: [facility];  Service: Cardiovascular;  Laterality: N/A;   SHOULDER ARTHROSCOPY WITH ROTATOR CUFF REPAIR Left 2017   "[name]"   TEE WITHOUT CARDIOVERSION N/A 07/29/2021   Procedure: TRANSESOPHAGEAL ECHOCARDIOGRAM (TEE);  Surgeon: Josue Hector, MD;  [facility];  Service: Cardiovascular;  Laterality: N/A;   Family History  Problem Relation Age of Onset   Breast cancer Mother    Diabetes Father    Hypertension Father    Cancer Father        lung, basal cell, melanoma   Parkinson's disease Father    Colon cancer Neg Hx    Esophageal cancer Neg Hx    Rectal cancer Neg Hx    Stomach cancer Neg Hx    Colon polyps Neg Hx    Social History   Socioeconomic History   Marital status: Married    Spouse name: Not on file   Number of children: Not on file   Years of education: 16   Highest education level: Bachelor's degree (e.g., BA, AB, BS)  Occupational History   Not on file  Tobacco Use   Smoking status: Former    Packs/day: 0.10    Years: 3.00    Total pack years: 0.30    Types: Cigarettes   Smokeless tobacco: Never  Vaping Use   Vaping Use: Never used  Substance and Sexual Activity   Alcohol use: No   Drug use: No   Sexual activity: Yes  Other Topics Concern   Not on file  Social History Narrative   Not on file   Social Determinants of Health   Financial Resource Strain: Low Risk  (07/08/2022)   Overall Financial Resource Strain (CARDIA)    Difficulty of Paying Living Expenses: Not hard at all  Food Insecurity: No Food Insecurity (07/08/2022)   Hunger Vital Sign    Worried About Running Out of Food in the Last Year: Never true    Ran Out of Food in the Last Year: Never true  Transportation Needs: No  Transportation Needs (07/08/2022)   PRAPARE - Hydrologist (Medical): No    Lack of Transportation (Non-Medical): No  Physical Activity: Insufficiently Active (07/08/2022)   Exercise Vital Sign    Days of Exercise per Week: 3 days    Minutes of Exercise per Session: 30 min  Stress: No Stress Concern Present (07/08/2022)   [place]    Feeling of Stress : Not at all  Social Connections: [place] (07/08/2022)   Social Connection and Isolation Panel [NHANES]    Frequency of Communication with Friends and Family: More than three times a week    Frequency  of Social Gatherings with Friends and Family: More than three times a week    Attends Religious Services: More than 4 times per year    Active Member of [employer] or Organizations: Yes    Attends Music therapist: More than 4 times per year    Marital Status: Married    Tobacco Counseling Counseling given: Not Answered   Clinical Intake:  Pre-visit preparation completed: Yes  Pain : No/denies pain Nutrition Risk Assessment:  Has the patient had any N/V/D within the last 2 months?  No  Does the patient have any non-healing wounds?  No  Has the patient had any unintentional weight loss or weight gain?  No   Diabetes:  Is the patient diabetic?  Yes  If diabetic, was a CBG obtained today?  No  Did the patient bring in their glucometer from home?  No  How often do you monitor your CBG's? 2 X Daily.   Financial Strains and Diabetes Management:  Are you having any financial strains with the device, your supplies or your medication? No .  Does the patient want to be seen by Chronic Care Management for management of their diabetes?  No  Would the patient like to be referred to a Nutritionist or for Diabetic Management?  No   Diabetic Exams:  Diabetic Eye Exam: Completed No. Overdue for diabetic eye exam. Pt has been  advised about the importance in completing this exam. A referral has been placed today. Message sent to referral coordinator for scheduling purposes. Advised pt to expect a call from office referred to regarding appt.  Diabetic Foot Exam: Completed No. Pt has been advised about the importance in completing this exam. Pt is scheduled for diabetic foot exam on Followed by PCP.      BMI - recorded: 25.28 Nutritional Status: BMI 25 -29 Overweight Nutritional Risks: None Diabetes: Yes CBG done?: No Did pt. bring in CBG monitor from home?: No  How often do you need to have someone help you when you read instructions, pamphlets, or other written materials from your doctor or pharmacy?: 1 - Never  Diabetic?  Yes  Interpreter Needed?: No  Information entered by :: Terry Arbour LPN   Activities of Daily Living    07/08/2022    2:20 PM 07/04/2022    9:59 AM  In your present state of health, do you have any difficulty performing the following activities:  Hearing? 0 0  Vision? 0 0  Difficulty concentrating or making decisions? 0 0  Walking or climbing stairs? 0 0  Dressing or bathing? 0 0  Doing errands, shopping? 0 0  Preparing Food and eating ? N N  Using the Toilet? N N  In the past six months, have you accidently leaked urine? N N  Do you have problems with loss of bowel control? N N  Managing your Medications? N N  Managing your Finances? N N  Housekeeping or managing your Housekeeping? N N    Patient Care Team: [name], MD as PCP - General Jerline Pain, MD as PCP - Cardiology (Cardiology) Constance Haw, MD as PCP - Electrophysiology (Cardiology)  Indicate any recent Medical Services you may have received from other than [facility] providers in the past year (date may be approximate).     Assessment:   This is a routine wellness examination for Terry Johns.  Hearing/Vision screen Hearing Screening - Comments:: Denies hearing difficulties   Vision Screening -  Comments:: - up to  date with routine eye exams with  Dr Nicki Reaper  Dietary issues and exercise activities discussed:     Goals Addressed               This Visit's Progress     Stay Healthy (pt-stated)         Depression Screen    07/08/2022    2:19 PM 01/15/2022   11:50 AM 11/19/2021   12:24 PM 07/05/2021    1:20 PM 07/05/2021    1:17 PM 10/05/2019    9:19 AM 06/08/2017    1:11 PM  PHQ 2/9 Scores  PHQ - 2 Score 0 0 0 0 0 0 0  PHQ- 9 Score      0     Fall Risk    07/08/2022    2:21 PM 07/04/2022    9:59 AM 11/19/2021    9:54 AM 07/05/2021    1:20 PM 07/03/2021    9:48 AM  Fall Risk   Falls in the past year? 0 0 1 0 0  Number falls in past yr: 0  0 0   Injury with Fall? 0  0 0   Risk for fall due to : No Fall Risks  History of fall(s)    Follow up Falls prevention discussed  Falls evaluation completed Falls evaluation completed     [place]:  Any stairs in or around the home? Yes  If so, are there any without handrails? No  Home free of loose throw rugs in walkways, pet beds, electrical cords, etc? Yes  Adequate lighting in your home to reduce risk of falls? Yes   ASSISTIVE DEVICES UTILIZED TO PREVENT FALLS:  Life alert? No  Use of a cane, walker or w/c? No  Grab bars in the bathroom? No  Shower chair or bench in shower? Yes  Elevated toilet seat or a handicapped toilet? Yes   TIMED UP AND GO:  Was the test performed? No . Audio Visit   Cognitive Function:        07/08/2022    2:21 PM  6CIT Screen  What Year? 0 points  What month? 0 points  What time? 0 points  Count back from 20 0 points  Months in reverse 2 points  Repeat phrase 0 points  Total Score 2 points    Immunizations Immunization History  Administered Date(s) Administered   Fluad Quad(high Dose 65+) 08/14/2020   Influenza Split 06/12/2011, 07/14/2012   Influenza Whole 05/28/2010   Influenza,inj,Quad PF,6+ Mos 08/04/2014, 05/18/2015, 06/08/2017,  06/16/2019   PFIZER(Purple Top)SARS-COV-2 Vaccination 11/05/2019, 11/26/2019, 08/15/2020   PNEUMOCOCCAL CONJUGATE-20 03/17/2022   Tdap 06/08/2017    TDAP status: Up to date  Flu Vaccine status: Up to date  Pneumococcal vaccine status: Up to date  Covid-19 vaccine status: Completed vaccines  Qualifies for Shingles Vaccine? Yes   Zostavax completed No   Shingrix Completed?: No.    Education has been provided regarding the importance of this vaccine. Patient has been advised to call insurance company to determine out of pocket expense if they have not yet received this vaccine. Advised may also receive vaccine at local pharmacy or Health Dept. Verbalized acceptance and understanding.  Screening Tests Health Maintenance  Topic Date Due   OPHTHALMOLOGY EXAM  07/08/2022 (Originally 10/06/2020)   FOOT EXAM  07/09/2022 (Originally 06/07/1966)   COVID-19 Vaccine (4 - Pfizer risk series) 07/24/2022 (Originally 10/10/2020)   Zoster Vaccines- Shingrix (1 of 2) 10/08/2022 (Originally 06/07/1974)  INFLUENZA VACCINE  11/30/2022 (Originally 04/01/2022)   HEMOGLOBIN A1C  09/17/2022   Diabetic kidney evaluation - GFR measurement  03/18/2023   Diabetic kidney evaluation - Urine ACR  03/18/2023   Medicare Annual Wellness (AWV)  07/09/2023   TETANUS/TDAP  06/09/2027   COLONOSCOPY (Pts 45-75yr Insurance coverage will need to be confirmed)  06/15/2027   Pneumonia Vaccine 66 Years old  Completed   Hepatitis C Screening  Completed   HPV VACCINES  Aged Out    Health Maintenance  There are no preventive care reminders to display for this patient.   Colorectal cancer screening: Type of screening: Colonoscopy. Completed 09/25/19. Repeat every 7 years  Lung Cancer Screening: (Low Dose CT Chest recommended if Age 67-80years, 30 pack-year currently smoking OR have quit w/in 15years.) does not qualify.     Additional Screening:  Hepatitis C Screening: does qualify; Completed 06/08/17  Vision Screening:  Recommended annual ophthalmology exams for early detection of glaucoma and other disorders of the eye. Is the patient up to date with their annual eye exam?  Yes  Who is the provider or what is the name of the office in which the patient attends annual eye exams? Dr SNicki ReaperIf pt is not established with a provider, would they like to be referred to a provider to establish care? No .   Dental Screening: Recommended annual dental exams for proper oral hygiene  Community Resource Referral / Chronic Care Management:  CRR required this visit?  No   CCM required this visit?  No      Plan:     I have personally reviewed and noted the following in the patient's chart:   Medical and social history Use of alcohol, tobacco or illicit drugs  Current medications and supplements including opioid prescriptions. Patient is currently taking opioid prescriptions. Information provided to patient regarding non-opioid alternatives. Patient advised to discuss non-opioid treatment plan with their provider. Functional ability and status Nutritional status Physical activity Advanced directives List of other physicians Hospitalizations, surgeries, and ER visits in previous 12 months Vitals Screenings to include cognitive, depression, and falls Referrals and appointments  In addition, I have reviewed and discussed with patient certain preventive protocols, quality metrics, and best practice recommendations. A written personalized care plan for preventive services as well as general preventive health recommendations were provided to patient.     BCriselda Peaches LPN   [id number]  Nurse Notes: None

## 2022-07-09 ENCOUNTER — Ambulatory Visit: Payer: Medicare Other | Attending: Cardiology | Admitting: Cardiology

## 2022-07-09 ENCOUNTER — Encounter: Payer: Self-pay | Admitting: Cardiology

## 2022-07-09 VITALS — BP 118/76 | HR 84 | Ht 73.0 in | Wt 193.6 lb

## 2022-07-09 DIAGNOSIS — R053 Chronic cough: Secondary | ICD-10-CM | POA: Diagnosis not present

## 2022-07-09 DIAGNOSIS — I493 Ventricular premature depolarization: Secondary | ICD-10-CM | POA: Diagnosis not present

## 2022-07-09 NOTE — Progress Notes (Signed)
Electrophysiology Office Note   Date:  07/09/2022   ID:  Terry Johns, DOB April 02, 1956, MRN [id number]  PCP:  [name], MD  Cardiologist:  [name] Primary Electrophysiologist:  Mamie Levers, NP    No chief complaint on file.    History of Present Illness: Terry Johns is a 66 y.o. male who presents today for electrophysiology evaluation.   Terry Johns is a 66 y.o. male who is being seen today for the evaluation of PVCs at the request of Burchette, Alinda Sierras, MD.   He has a history significant for Ether Danlos syndrome, mitral valve prolapse with regurgitation post MVR, PVCs post ablation.  Mitral valve replacement was at [facility].  He presented to the hospital 08/25/2021 with syncope and rapid heart rates.  He is found to be in atrial flutter.  EMS also reported wide-complex tachycardia.  It was thought this was due to apparent atrial flutter.  He was started on amiodarone and was cardioverted.  Today, denies symptoms of palpitations, chest pain, shortness of breath, orthopnea, PND, lower extremity edema, claudication, dizziness, presyncope, syncope, bleeding, or neurologic sequela. The patient is tolerating medications without difficulties. He had covid about 8wks ago, has had chronic, productive cough since that time.  Gets dizzy at times during coughing fit. Has seen PCP for this, but symptoms persist. He and wife question whether there is something else that can be done to treat cough.  He has some daytime sleepiness, no morning HA, does not gasp for breath at night per spouse.   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   EXTERNAL HEMORRHOIDS 05/17/2009   MITRAL VALVE PROLAPSE 05/17/2009   Past Surgical History:  Procedure Laterality Date   CARDIAC CATHETERIZATION     COLONOSCOPY  2008   FEMORAL  ARTERY EXPLORATION Right 12/09/2016   Procedure: Evacuation of Hematoma Right Groin, Repair of Right Superfiical Femoral Artery;  Surgeon: Angelia Mould, MD;  Location: [facility];  Service: Vascular;  Laterality: Right;   PVC ABLATION  12/08/2016   PVC ABLATION N/A 12/08/2016   Procedure: PVC Ablation;  Surgeon: Kayliegh Boyers [name], MD;  Location: [facility];  Service: Cardiovascular;  Laterality: N/A;   RIGHT/LEFT HEART CATH AND CORONARY ANGIOGRAPHY N/A 07/29/2021   Procedure: RIGHT/LEFT HEART CATH AND CORONARY ANGIOGRAPHY;  Surgeon: Burnell Blanks, MD;  Location: [facility];  Service: Cardiovascular;  Laterality: N/A;   SHOULDER ARTHROSCOPY WITH ROTATOR CUFF REPAIR Left 2017   "[name]"   TEE WITHOUT CARDIOVERSION N/A 07/29/2021   Procedure: TRANSESOPHAGEAL ECHOCARDIOGRAM (TEE);  Surgeon: Josue Hector, MD;  Location: [facility] ENDOSCOPY;  Service: Cardiovascular;  Laterality: N/A;     Current Outpatient Medications  Medication Sig Dispense Refill   acetaminophen (TYLENOL) 500 MG tablet Take 500-1,000 mg by mouth every 6 (six) hours as needed (pain.).     aspirin EC 81 MG tablet Take 81 mg by mouth in the morning. Swallow whole.     glucose blood (ACCU-CHEK GUIDE) test strip Use as instructed to check blood sugars twice weekly. DX E11.65 200 strip 0   HYDROcodone bit-homatropine (HYCODAN) 5-1.5 MG/5ML syrup Take 5 mLs by mouth every 6 (six) hours as needed for cough. 120 mL 0   metFORMIN (GLUCOPHAGE) 500 MG tablet TAKE 2 TABLETS BY MOUTH TWICE A DAY  WITH MEALS 360 tablet 3   metoprolol succinate (TOPROL XL) 100 MG 24 hr tablet Take 1 tablet (100 mg total) by mouth daily. Take with or immediately following a meal. 90 tablet 3   No current facility-administered medications for this visit.    Allergies:   Patient has no known allergies.   Social History:  The patient  reports that he has quit smoking. His smoking use included cigarettes. He has a 0.30  pack-year smoking history. He has never used smokeless tobacco. He reports that he does not drink alcohol and does not use drugs.   Family History:  The patient's family history includes Breast cancer in his mother; Cancer in his father; Diabetes in his father; Hypertension in his father; Parkinson's disease in his father.   ROS:  Please see the history of present illness.   Otherwise, review of systems is positive for none.   All other systems are reviewed and negative.   PHYSICAL EXAM: VS:  BP 118/76   Pulse 84   Ht '6\' 1"'$  (1.854 m)   Wt 193 lb 9.6 oz (87.8 kg)   SpO2 95%   BMI 25.54 kg/m  , BMI Body mass index is 25.54 kg/m. GEN: Well nourished, well developed, in no acute distress  HEENT: normal  Neck: no JVD, carotid bruits, or masses Cardiac: RRR; no murmurs, rubs, or gallops,no edema  Respiratory:  clear to auscultation bilaterally, normal work of breathing GI: soft, nontender, nondistended, + BS MS: no deformity or atrophy  Skin: warm and dry Neuro:  Strength and sensation are intact Psych: euthymic mood, full affect  EKG:  EKG is ordered today. Personal review of the ekg shows SR with PVC, rate 84   Recent Labs: 07/28/2021: Magnesium 2.0 03/17/2022: ALT 12; BUN 16; Creatinine, Ser 0.89; Hemoglobin 14.5; Platelets 238.0; Potassium 5.6; Sodium 136; TSH 1.53    Lipid Panel     Component Value Date/Time   CHOL 198 03/17/2022 1349   TRIG 147.0 03/17/2022 1349   HDL 68.60 03/17/2022 1349   CHOLHDL 3 03/17/2022 1349   VLDL 29.4 03/17/2022 1349   LDLCALC 100 (H) 03/17/2022 1349     Wt Readings from Last 3 Encounters:  07/09/22 193 lb 9.6 oz (87.8 kg)  07/08/22 196 lb (88.9 kg)  06/24/22 196 lb 8 oz (89.1 kg)      Other studies Reviewed: Additional studies/ records that were reviewed today include:  TTE 10/07/21  1. Left ventricular ejection fraction, by estimation, is 40 to 45%. The  left ventricle has mildly decreased function. The left ventricle  demonstrates  global hypokinesis. There is moderate left ventricular  hypertrophy. Left ventricular diastolic  parameters are indeterminate.   2. Right ventricular systolic function is normal. The right ventricular  size is mildly enlarged. There is normal pulmonary artery systolic  pressure. The estimated right ventricular systolic pressure is 43.1 mmHg.   3. Left atrial size was mildly dilated.   4. Right atrial size was mildly dilated.   5. There is a prosthetic annuloplasty ring present in the mitral position      No evidence of mitral valve regurgitation      Mild to moderate mitral stenosis. The mean mitral valve gradient is  6.0 mmHg. MVA 1.7 cm^2 by continuity equation   6. The aortic valve is tricuspid. Aortic valve regurgitation is not  visualized. Aortic valve sclerosis is present, with no evidence of aortic  valve stenosis.   7. The inferior vena cava is normal  in size with greater than 50%  respiratory variability, suggesting right atrial pressure of 3 mmHg.   Cardiac monitor 09/23/2021 personally reviewed Predominant underlying rhythm was sinus rhythm 1 run of ventricular tachycardia 20 beats at 226 bpm 4.3% ventricular ectopy Less than 1% supraventricular ectopy No triggered episodes recorded  ASSESSMENT AND PLAN:  #)  PVCs: 20% on cardiac monitor.  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  Most recent monitor with a 4.3% burden and a 20 beat run of VT.  Episodes occurred while sleeping.  He was asymptomatic while wearing the monitor.  Danton Palmateer change to taking metop at night to see if improves daytime sleepiness. No change to dose at this time.   2.  Mitral regurgitation: Status post MVR.  Plan per primary cardiology.  3.  Atrial flutter: Underwent cardioversion in the emergency room.  CHA2DS2-VASc of 2.  Has been taken off of his anticoagulant as his arrhythmia was potentially a consequence of surgery.  No obvious  recurrence.  4.  Mild systolic heart failure: Currently on Toprol-XL daily.  Plan per primary cardiology.   Current medicines are reviewed at length with the patient today.   The patient does not have concerns regarding his medicines.  The following changes were made today: take metop nightly instead of in AM  Labs/ tests ordered today include:  Orders Placed This Encounter  Procedures   Ambulatory referral to Pulmonology   EKG 12-Lead    Disposition:   FU with Kailynn Satterly 6 months  Signed, Mamie Levers, NP  07/09/2022 10:28 AM     [facility] [place] [address] [phone] (office) [phone] (fax)  I have seen and examined this patient with Mamie Levers.  Agree with above, note added to reflect my findings.  He has been overall doing well from a cardiac perspective.  He has noted no further episodes of atrial fibrillation or atrial flutter.  Unfortunately he has developed a cough that has been occurring over the last 8 weeks.  This started during a COVID infection, but he has not improved.  He does not have any cardiac complaints at this time.  GEN: Well nourished, well developed, in no acute distress  HEENT: normal  Neck: no JVD, carotid bruits, or masses Cardiac: irregular; no murmurs, rubs, or gallops,no edema  Respiratory:  clear to auscultation bilaterally, normal work of breathing GI: soft, nontender, nondistended, + BS MS: no deformity or atrophy  Skin: warm and dry Neuro:  Strength and sensation are intact Psych: euthymic mood, full affect   PVCs: Elevated burden on cardiac monitor.  Most recent monitor with a 4.3% burden.  He is currently on metoprolol, tolerating it well.  No changes. Mitral regurgitation: Status post MVR.  Plan per primary cardiology. Atrial flutter: Appeared postoperatively.  No further episodes.  Continue with current management. Shortness of breath: We Coni Homesley plan for pulmonary referral for  cough post COVID.  Laguana Desautel M. Cypress Hinkson MD 07/09/2022 10:32 AM

## 2022-07-09 NOTE — Patient Instructions (Signed)
Medication Instructions:  Your physician recommends that you continue on your current medications as directed. Please refer to the Current Medication list given to you today.  *If you need a refill on your cardiac medications before your next appointment, please call your pharmacy*   Lab Work: None ordered If you have labs (blood work) drawn today and your tests are completely normal, you will receive your results only by: [place] (if you have MyChart) OR A paper copy in the mail If you have any lab test that is abnormal or we need to change your treatment, we will call you to review the results.   Testing/Procedures: None ordered   Follow-Up: At [facility], you and your health needs are our priority.  As part of our continuing mission to provide you with exceptional heart care, we have created designated Provider Care Teams.  These Care Teams include your primary Cardiologist (physician) and Advanced Practice Providers (APPs -  Physician Assistants and Nurse Practitioners) who all work together to provide you with the care you need, when you need it.  Your next appointment:   6 month(s)  The format for your next appointment:   In Person  Provider:   You may see Will [name], MD or one of the following Advanced Practice Providers on your designated Care Team:   Tommye Standard, [place] Legrand Como "Jonni Sanger" [place], [place]   You have been referred to pulmonology   Thank you for choosing [facility]!!   Trinidad Curet, RN [phone]  Other Instructions   Important Information About Sugar

## 2022-07-10 ENCOUNTER — Encounter: Payer: Self-pay | Admitting: Family Medicine

## 2022-07-21 ENCOUNTER — Other Ambulatory Visit: Payer: Self-pay | Admitting: Family Medicine

## 2022-07-28 ENCOUNTER — Ambulatory Visit (INDEPENDENT_AMBULATORY_CARE_PROVIDER_SITE_OTHER): Payer: Medicare Other | Admitting: Pulmonary Disease

## 2022-07-28 ENCOUNTER — Telehealth: Payer: Self-pay | Admitting: Pulmonary Disease

## 2022-07-28 ENCOUNTER — Encounter: Payer: Self-pay | Admitting: Pulmonary Disease

## 2022-07-28 VITALS — BP 118/64 | HR 82 | Ht 73.0 in | Wt 194.0 lb

## 2022-07-28 DIAGNOSIS — R053 Chronic cough: Secondary | ICD-10-CM | POA: Diagnosis not present

## 2022-07-28 DIAGNOSIS — J986 Disorders of diaphragm: Secondary | ICD-10-CM

## 2022-07-28 DIAGNOSIS — J3489 Other specified disorders of nose and nasal sinuses: Secondary | ICD-10-CM

## 2022-07-28 MED ORDER — FLUTICASONE PROPIONATE 50 MCG/ACT NA SUSP: NASAL | 2 refills | Status: DC

## 2022-07-28 MED ORDER — BUDESONIDE-FORMOTEROL FUMARATE 160-4.5 MCG/ACT IN AERO: 2.0000 | INHALATION_SPRAY | Freq: Two times a day (BID) | RESPIRATORY_TRACT | 12 refills | Status: DC

## 2022-07-28 NOTE — Progress Notes (Signed)
$'@Patient'x$  ID: Terry Johns, male    DOB: [birth year], 66 y.o.   MRN: [id number]  Chief Complaint  Patient presents with   Consult    Consult for cough. Pt states that cough has lasted for 10 weeks now. And he did have covid before the cough occurred. Pt was given Hydrocodone cough medication and it id help. Pt states he does have a productive cough with green mucus. Pt states he has pulmonary hernia upper right chest that pokes out sometimes when he coughs. Chest xray 06/13/2022. Cough comes and goes per patient     Referring provider: Constance Haw, MD  HPI:   66 y.o. man whom are seen in consultation for evaluation of chronic cough.  Note from referring provider reviewed.  Most recent cardiology note reviewed.  Most recent [facility] cardiothoracic surgery note reviewed.  Patient developed COVID infection 05/2022.  Early September.  Had about a week of malaise, fatigue, fever, feeling rundown, cough.  All symptoms improved.  However cough persisted.  Severe.  Throughout the day.  Productive of mucus.  Relieved with Hycodan cough syrup.  No improvement with albuterol.  No real improvement with antibiotics.  No real improvement with Depo-Medrol.  Over time, gradually improved in terms of frequency and severity.  Relieved with time.  No longer needing cough syrup.  Cough still productive of green mucus.  Associated nasal congestion.  Rhinorrhea.  Chest x-ray 06/15/2022 reviewed interpret as clear lungs bilaterally with persistent left hemidiaphragm elevation.  Chest x-ray 12/2010 reviewed interpret as clear lungs with elevation of left hemidiaphragm.  CT scan at [facility] 07/07/2022 results reviewed, cannot see images, reveals reported right middle lobe and right lower lobe peribronchovascular groundglass opacities.  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  Lab Results: Personally reviewed   CBC    Component Value Date/Time   WBC 6.1 03/17/2022 1349   RBC 4.98 03/17/2022 1349   HGB 14.5 03/17/2022 1349   HGB 15.2 12/01/2016 1003   HCT 42.0 03/17/2022 1349   HCT 42.8 12/01/2016 1003   PLT 238.0 03/17/2022 1349   PLT 210 12/01/2016 1003   MCV 84.4 03/17/2022 1349   MCV 84 12/01/2016 1003   MCH 29.3 08/25/2021 1700   MCHC 34.4 03/17/2022 1349   RDW 14.2 03/17/2022 1349   RDW 13.8 12/01/2016 1003   LYMPHSABS 1.6 03/17/2022 1349   LYMPHSABS 1.6 12/01/2016 1003   MONOABS 0.5 03/17/2022 1349   EOSABS 0.2 03/17/2022 1349   EOSABS 0.2 12/01/2016 1003   BASOSABS 0.0 03/17/2022 1349   BASOSABS 0.0 12/01/2016 1003  BMET    Component Value Date/Time   NA 136 03/17/2022 1349   NA 140 12/01/2016 1003   K 5.6 (H) 03/17/2022 1349   CL 98 03/17/2022 1349   CO2 27 03/17/2022 1349   GLUCOSE 212 (H) 03/17/2022 1349   BUN 16 03/17/2022 1349   BUN 17 12/01/2016 1003   CREATININE 0.89 03/17/2022 1349   CALCIUM 10.2 03/17/2022 1349   GFRNONAA >60 08/25/2021 1700   GFRAA >60 12/10/2016 0350    BNP No results found for: "BNP"  ProBNP No results found for: "PROBNP"  Specialty Problems   None   No Known Allergies  Immunization History  Administered Date(s) Administered   Fluad Quad(high Dose 65+) 08/14/2020   Influenza Split 06/12/2011, 07/14/2012   Influenza Whole 05/28/2010   Influenza,inj,Quad PF,6+ Mos 08/04/2014,  05/18/2015, 06/08/2017, 06/16/2019   Influenza-Unspecified 08/14/2021   PFIZER Comirnaty(Gray Top)Covid-19 Tri-Sucrose Vaccine 11/05/2019, 11/26/2019   PFIZER(Purple Top)SARS-COV-2 Vaccination 11/05/2019, 11/26/2019, 08/15/2020   PNEUMOCOCCAL CONJUGATE-20 03/17/2022   Tdap 06/08/2017    Past Medical History:  Diagnosis Date   Cataract    Diabetes mellitus without complication ([place])    DYSPHAGIA UNSPECIFIED 05/28/2010   Ehlers-Danlos disease    ERECTILE DYSFUNCTION, 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    Counseling given: Not Answered Tobacco comments: Did smoke some in his 71s.    Continue to not smoke  Outpatient Encounter Medications as of 07/28/2022  Medication Sig   acetaminophen (TYLENOL) 500 MG tablet Take 500-1,000 mg by mouth every 6 (six) hours as needed (pain.).   aspirin EC 81 MG tablet Take 81 mg by mouth in the morning. Swallow whole.   budesonide-formoterol (SYMBICORT) 160-4.5 MCG/ACT inhaler Inhale 2 puffs into the lungs 2 (two) times daily.   fluticasone (FLONASE) 50 MCG/ACT nasal spray Use 1 spray each nostril twice a day for one week then daily thereafter   glucose blood (ACCU-CHEK GUIDE) test strip Use as instructed to check blood sugars twice weekly. DX E11.65   metFORMIN (GLUCOPHAGE) 500 MG tablet TAKE 2 TABLETS BY MOUTH TWICE A DAY WITH MEALS   metoprolol succinate (TOPROL XL) 100 MG 24 hr tablet Take 1 tablet (100 mg total) by mouth daily. Take with or immediately following a meal.   HYDROcodone bit-homatropine (HYCODAN) 5-1.5 MG/5ML syrup Take 5 mLs by mouth every 6 (six) hours as needed for cough. (Patient not taking: Reported on 07/28/2022)   No facility-administered encounter  medications on file as of 07/28/2022.     Review of Systems  Review of Systems  No chest pain with surgery.  No orthopnea or PND.  Comprehensive review of systems otherwise negative. Physical Exam  BP 118/64 (BP Location: Left Arm, Patient Position: Sitting, Cuff Size: Normal)   Pulse 82   Ht '6\' 1"'$  (1.854 m)   Wt 194 lb (88 kg)   SpO2 97%   BMI 25.60 kg/m   Wt Readings from Last 5 Encounters:  07/28/22 194 lb (88 kg)  07/09/22 193 lb 9.6 oz (87.8 kg)  07/08/22 196 lb (88.9 kg)  06/24/22 196 lb 8 oz (89.1 kg)  06/13/22 192 lb 4.8 oz (87.2 kg)    BMI Readings from Last  5 Encounters:  07/28/22 25.60 kg/m  07/09/22 25.54 kg/m  07/08/22 25.28 kg/m  06/24/22 26.65 kg/m  06/13/22 26.08 kg/m     Physical Exam General: Sitting in chair, no acute distress Eyes: EOMI, no icterus Neck: Supple, no JVP Pulmonary: Good air movement, normal work of breathing, deep breath induced cough, occasional coughing spasms during conversation. Cardiovascular: Warm, no edema Abdomen: Nondistended, bowel sounds present MSK: No synovitis, no joint effusion Neuro: Normal gait, no weakness Psych: Normal mood, full affect  Assessment & Plan:   Postviral cough syndrome: Present for approaching nearly 3 months.  Gradual improvement in frequency and severity of cough over time.  Given length, possible development of asthma.  No significant improvement with rounds of antibiotics and single dose of Depo Solu-Medrol.  Trial high-dose Symbicort HFA 2 puffs twice daily.  Nasal congestion/rhinorrhea: Possibly contributing cause.  Flonase 1 spray each nostril twice daily for 7 days then decrease to once daily.  Can stop if improved.  Right middle, right lower lobe GGO's: Seen on CT scan at [facility] 07/07/2022.  Suspect post-COVID inflammatory changes.  They raise concern for possible organizing pneumonia.  Given improvement in symptoms overall, favor repeat imaging in the future.  Sooner if symptoms or not  improving.  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  Denies history of trauma, surgical history of the chest that predates 2012, cervical spine surgery or trauma.  Likely postviral versus idiopathic versus related to neuropathy.  No further work-up at this time.   Return in about 3 months (around 10/28/2022).   Lanier Clam, MD 07/28/2022   This appointment required 65 minutes of patient care (this includes precharting, chart review, review of results, face-to-face care, etc.).

## 2022-07-28 NOTE — Patient Instructions (Addendum)
Nice to meet you  Use Symbicort 2 puffs Twice daily - rinse mouth with water and spit out after every use  I hope this helps the cough that is likely related covid infection and lingering inflammation  For the nasal congestion, use flonase 1 spray each nostril twice a day for 1 week then decrease to once daily  Return to clinic in 2 months or sooner as needed

## 2022-07-29 ENCOUNTER — Encounter: Payer: Self-pay | Admitting: Pulmonary Disease

## 2022-07-29 ENCOUNTER — Other Ambulatory Visit: Payer: Self-pay | Admitting: Family Medicine

## 2022-07-29 DIAGNOSIS — N529 Male erectile dysfunction, unspecified: Secondary | ICD-10-CM

## 2022-07-30 ENCOUNTER — Other Ambulatory Visit (HOSPITAL_COMMUNITY): Payer: Self-pay

## 2022-07-31 ENCOUNTER — Other Ambulatory Visit (HOSPITAL_COMMUNITY): Payer: Self-pay

## 2022-07-31 NOTE — Telephone Encounter (Signed)
Per benefits investigation generic Symbicort is not covered under insurance. It seems that the patient might have a deductible/coverage gap that needs to be met for the co-pay to go down. Flonase seems to be processed for a 3 month supply.

## 2022-07-31 NOTE — Telephone Encounter (Signed)
Brand name Symbicort is currently the cheapest option; again they have a deductible that needs to be met before the cost of these types of medication will go down.  Alternative that are covered are Brand Advair Diskus and Breo.

## 2022-08-01 MED ORDER — SYMBICORT 160-4.5 MCG/ACT IN AERO: 2.0000 | INHALATION_SPRAY | Freq: Two times a day (BID) | RESPIRATORY_TRACT | 12 refills | Status: DC

## 2022-08-08 ENCOUNTER — Telehealth: Payer: Self-pay | Admitting: *Deleted

## 2022-08-08 ENCOUNTER — Encounter: Payer: Self-pay | Admitting: *Deleted

## 2022-08-08 NOTE — Patient Instructions (Signed)
Visit Information  Thank you for taking time to visit with me today. Please don't hesitate to contact me if I can be of assistance to you.   Following are the goals we discussed today:   Goals Addressed               This Visit's Progress     COMPLETED: No needs (pt-stated)        Care Coordination Interventions: Reviewed medications with patient and discussed adherence to all medication with no needed refill Reviewed scheduled/upcoming provider appointments including sufficient transportation source Screening for signs and symptoms of depression related to chronic disease state  Assessed social determinant of health barriers         Please call the care guide team at [phone] if you need to cancel or reschedule your appointment.   If you are experiencing a Mental Health or [place] or need someone to talk to, please call the Suicide and Crisis Lifeline: 988 call the [place] National Suicide Prevention Lifeline: (207)335-5982 or TTY: 603-835-4588 TTY (763) 417-1877) to talk to a trained counselor call 1-800-273-TALK (toll free, 24 hour hotline)  Patient verbalizes understanding of instructions and care plan provided today and agrees to view in [place]. Active MyChart status and patient understanding of how to access instructions and care plan via MyChart confirmed with patient.     No further needs or follow up calls.  Raina Mina, RN Care Management Coordinator [place] Office [phone]

## 2022-08-08 NOTE — Patient Outreach (Signed)
  Care Coordination   Initial Visit Note   08/08/2022 Name: Terry Johns MRN: [id number] DOB: [birth year]  Terry Johns is a 66 y.o. year old male who sees Burchette, Alinda Sierras, MD for primary care. I spoke with  Terry Johns by phone today.  What matters to the patients health and wellness today?  No needs    Goals Addressed               This Visit's Progress     COMPLETED: No needs (pt-stated)        Care Coordination Interventions: Reviewed medications with patient and discussed adherence to all medication with no needed refill Reviewed scheduled/upcoming provider appointments including sufficient transportation source Screening for signs and symptoms of depression related to chronic disease state  Assessed social determinant of health barriers         SDOH assessments and interventions completed:  Yes  SDOH Interventions Today    Flowsheet Row Most Recent Value  SDOH Interventions   Food Insecurity Interventions Intervention Not Indicated  Housing Interventions Intervention Not Indicated  Transportation Interventions Intervention Not Indicated  Utilities Interventions Intervention Not Indicated        Care Coordination Interventions:  Yes, provided   Follow up plan: No further intervention required.   Encounter Outcome:  Pt. Visit Completed   Raina Mina, RN Care Management Coordinator [place] Office [phone]

## 2022-08-12 NOTE — Telephone Encounter (Signed)
Mychart message sent by pt checking to see if Dr. Silas Flood has been able to review recent imaging from [facility]. Dr. Silas Flood please advise.

## 2022-08-13 NOTE — Telephone Encounter (Signed)
New message sent by pt:  Marcine Matar [facility] (supporting [name], MD)51 minutes ago (10:36 AM)    I keep losing my voice the last several days.  Could the inhaler be causing this?        Routing to Dr. Silas Flood.

## 2022-08-19 NOTE — Telephone Encounter (Signed)
Dr. Silas Flood, please see pt's messages - he states he has experienced hoarseness and then stopped taking the symbicort and his voice returned to normal. Pt states he is not experiencing soreness or issues within his mouth. Pt also questioning if you have reviewed CT from [place]. Thanks.

## 2022-08-19 NOTE — Telephone Encounter (Signed)
Yes - disc please - thanks!

## 2022-08-20 MED ORDER — AMOXICILLIN-POT CLAVULANATE 875-125 MG PO TABS: 1.0000 | ORAL_TABLET | Freq: Two times a day (BID) | ORAL | 0 refills | Status: DC

## 2022-08-20 NOTE — Telephone Encounter (Signed)
Recent message sent by pt: [facility] (supporting [name], MD)53 minutes ago (9:33 AM)    Please call in the antibiotic to [facility] for me. Thanks     Routing to Dr. Silas Flood.

## 2022-08-20 NOTE — Addendum Note (Signed)
Addended by: Collier Salina on: 08/20/2022 11:14 AM   Modules accepted: Orders

## 2022-08-20 NOTE — Telephone Encounter (Signed)
Augmentin 875-125 BID for 10 days. Please instruct to take probiotic or eat yogurt every day while taking antibiotic. Thanks!

## 2022-08-29 NOTE — Telephone Encounter (Signed)
April, do you still have the signed medical release for [facility] for this patient?

## 2022-08-30 ENCOUNTER — Other Ambulatory Visit: Payer: Self-pay | Admitting: Family Medicine

## 2022-08-30 DIAGNOSIS — N529 Male erectile dysfunction, unspecified: Secondary | ICD-10-CM

## 2022-09-05 NOTE — Telephone Encounter (Signed)
Mychart message sent by pt checking to see if disc has been received yet. April, please advise.

## 2022-09-05 NOTE — Telephone Encounter (Signed)
Called patient to inform him that we did get the images from [facility] for his CT scan and that when Dr Silas Flood has a chance he will look at the images and get back in touch with him regarding the results. Nothing further needed

## 2022-09-10 ENCOUNTER — Other Ambulatory Visit: Payer: Self-pay | Admitting: Family Medicine

## 2022-09-10 ENCOUNTER — Encounter: Payer: Self-pay | Admitting: Family Medicine

## 2022-09-10 DIAGNOSIS — N529 Male erectile dysfunction, unspecified: Secondary | ICD-10-CM

## 2022-09-10 MED ORDER — SILDENAFIL CITRATE 100 MG PO TABS: ORAL_TABLET | ORAL | 5 refills | Status: DC

## 2022-09-10 NOTE — Telephone Encounter (Signed)
Ok to refill 

## 2022-09-10 NOTE — Addendum Note (Signed)
Addended by: Nilda Riggs on: 09/10/2022 05:28 PM   Modules accepted: Orders

## 2022-09-10 NOTE — Telephone Encounter (Signed)
Pt sent mychart message wanting to know if the CT on disc has been able to be viewed yet. Dr. Silas Flood, please advise.

## 2022-09-18 ENCOUNTER — Other Ambulatory Visit: Payer: Self-pay | Admitting: Nurse Practitioner

## 2022-10-09 NOTE — Telephone Encounter (Signed)
Handled in My Chart message

## 2022-10-16 ENCOUNTER — Ambulatory Visit (INDEPENDENT_AMBULATORY_CARE_PROVIDER_SITE_OTHER): Payer: Medicare Other | Admitting: Pulmonary Disease

## 2022-10-16 ENCOUNTER — Encounter: Payer: Self-pay | Admitting: Pulmonary Disease

## 2022-10-16 VITALS — BP 128/70 | HR 71 | Wt 188.8 lb

## 2022-10-16 DIAGNOSIS — R9389 Abnormal findings on diagnostic imaging of other specified body structures: Secondary | ICD-10-CM

## 2022-10-16 NOTE — Patient Instructions (Signed)
See you again  I suspect the chronic cough is still related to the COVID infection many months ago  For the more current or recent cough, I recommend the following:  1) over-the-counter in the cold and flu while there are [facility] sinus rinse boxes.  Please get this as well as the salt packets with him. Mix the salt packets with purified or distilled water and rinse completely in the nostrils twice a day for 1 week.  2) Use Flonase 1 spray each nostril twice a day for 1 week, use this after the salt or sinus rinses  3) take Zyrtec, generic cetirizine, 1 tablet in the evening  I ordered a CT scan to follow-up the abnormality seen at [facility] 07/2022.  Just to make sure these are getting better.  If there are things we need to address we will do so based on the results.  My hope is that things will look improved.  Please send me a message in a couple of weeks and let me know if the congestion and cough is improving, if the congestion is improved but that chronic cough persists I will plan to send in a different inhaler and we will try to continue to work on this.  Return to clinic in 3 months or sooner as needed with Dr. Silas Flood

## 2022-10-16 NOTE — Progress Notes (Signed)
$@PatientW$  ID: Terry Johns, male    DOB: 1956/08/10, 67 y.o.   MRN: [id number]  Chief Complaint  Patient presents with   Follow-up    Pt states for the last week he has had a lot of nasal congestion, mucus and sore throat. He states he coughed up a big thick brown colored mucus clot this morning. A lot of nasal congestion and feeling it in his throat as well. He states he tried cough drops and but no use with the symptoms. He is not on Symbicort due to fact it did not help. Pt states the cough is not as severe as it was before.     Referring provider: [name], MD  HPI:   67 y.o. man whom are seeing for evaluation of chronic cough.    Send respiratory overall cough is gradually improved.  Still present but severity and frequency improved with time.  Did not think Symbicort helped much.  He stopped this.  Also stopped Flonase does not think it helped much.  Over the last week to 10 days though more thick sputum production.  Feels like thick and dried out mucus from his sinuses or nasal passages.  Coughs up more in the morning.  Thick globs of sputum.  Denies GERD symptoms.  Discussed repeat CT scan given nodule opacities felt to be related to viral illness to reassure end of improvement.  He agrees.  HPI at initial visit: Patient developed COVID infection 05/2022.  Early September.  Had about a week of malaise, fatigue, fever, feeling rundown, cough.  All symptoms improved.  However cough persisted.  Severe.  Throughout the day.  Productive of mucus.  Relieved with Hycodan cough syrup.  No improvement with albuterol.  No real improvement with antibiotics.  No real improvement with Depo-Medrol.  Over time, gradually improved in terms of frequency and severity.  Relieved with time.  No longer needing cough syrup.  Cough still productive of green mucus.  Associated nasal congestion.  Rhinorrhea.  Chest x-ray 06/15/2022 reviewed interpret as clear lungs bilaterally with persistent left  hemidiaphragm elevation.  Chest x-ray 12/2010 reviewed interpret as clear lungs with elevation of left hemidiaphragm.  CT scan at [facility] 07/07/2022 results reviewed, cannot see images, reveals reported right middle lobe and right lower lobe peribronchovascular groundglass opacities.  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  Lab Results: Personally reviewed   CBC    Component Value Date/Time   WBC 6.1 03/17/2022 1349   RBC 4.98 03/17/2022 1349   HGB 14.5 03/17/2022 1349  HGB 15.2 12/01/2016 1003   HCT 42.0 03/17/2022 1349   HCT 42.8 12/01/2016 1003   PLT 238.0 03/17/2022 1349   PLT 210 12/01/2016 1003   MCV 84.4 03/17/2022 1349   MCV 84 12/01/2016 1003   MCH 29.3 08/25/2021 1700   MCHC 34.4 03/17/2022 1349   RDW 14.2 03/17/2022 1349   RDW 13.8 12/01/2016 1003   LYMPHSABS 1.6 03/17/2022 1349   LYMPHSABS 1.6 12/01/2016 1003   MONOABS 0.5 03/17/2022 1349   EOSABS 0.2 03/17/2022 1349   EOSABS 0.2 12/01/2016 1003   BASOSABS 0.0 03/17/2022 1349   BASOSABS 0.0 12/01/2016 1003    BMET    Component Value Date/Time   NA 136 03/17/2022 1349   NA 140 12/01/2016 1003   K 5.6 (H) 03/17/2022 1349   CL 98 03/17/2022 1349   CO2 27 03/17/2022 1349   GLUCOSE 212 (H) 03/17/2022 1349   BUN 16 03/17/2022 1349   BUN 17 12/01/2016 1003    CREATININE 0.89 03/17/2022 1349   CALCIUM 10.2 03/17/2022 1349   GFRNONAA >60 08/25/2021 1700   GFRAA >60 12/10/2016 0350    BNP No results found for: "BNP"  ProBNP No results found for: "PROBNP"  Specialty Problems   None   No Known Allergies  Immunization History  Administered Date(s) Administered   Fluad Quad(high Dose 65+) 08/14/2020   Influenza Split 06/12/2011, 07/14/2012   Influenza Whole 05/28/2010   Influenza,inj,Quad PF,6+ Mos 08/04/2014, 05/18/2015, 06/08/2017, 06/16/2019   Influenza-Unspecified 08/14/2021   PFIZER Comirnaty(Gray Top)Covid-19 Tri-Sucrose Vaccine 11/05/2019, 11/26/2019   PFIZER(Purple Top)SARS-COV-2 Vaccination 11/05/2019, 11/26/2019, 08/15/2020   PNEUMOCOCCAL CONJUGATE-20 03/17/2022   Tdap 06/08/2017    Past Medical History:  Diagnosis Date   Cataract    Diabetes mellitus without complication ([place])    DYSPHAGIA UNSPECIFIED 05/28/2010   Ehlers-Danlos disease    ERECTILE DYSFUNCTION, 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    Counseling given: Not Answered Tobacco comments: Did smoke some in his 22s.    Continue to not smoke  Outpatient Encounter Medications as of 10/16/2022  Medication Sig   aspirin EC 81 MG tablet Take 81 mg by mouth in the morning. Swallow whole.   glucose blood (ACCU-CHEK GUIDE) test strip Use as instructed to check blood sugars twice weekly. DX E11.65   metFORMIN (GLUCOPHAGE) 500 MG tablet TAKE 2 TABLETS BY MOUTH TWICE A DAY WITH MEALS   metoprolol succinate (TOPROL-XL) 100 MG 24 hr tablet TAKE 1 TABLET BY MOUTH DAILY. TAKE WITH OR IMMEDIATELY FOLLOWING A MEAL.   sildenafil (VIAGRA) 100 MG tablet TAKE 1/2 TO 1 TABLET BY MOUTH EVERY DAY AS  NEEDED   [DISCONTINUED] acetaminophen (TYLENOL) 500 MG tablet Take 500-1,000 mg by mouth every 6 (six) hours as needed (pain.).   [DISCONTINUED] amoxicillin-clavulanate (AUGMENTIN) 875-125 MG tablet Take 1 tablet by mouth 2 (two) times daily.   [DISCONTINUED] fluticasone (FLONASE) 50 MCG/ACT nasal spray Use 1 spray each nostril twice a day for one week then daily thereafter   [DISCONTINUED] HYDROcodone bit-homatropine (HYCODAN) 5-1.5 MG/5ML syrup Take 5 mLs by mouth every 6 (six) hours as needed for cough. (Patient not taking: Reported on 07/28/2022)   [DISCONTINUED] SYMBICORT 160-4.5 MCG/ACT inhaler  Inhale 2 puffs into the lungs 2 (two) times daily.   No facility-administered encounter medications on file as of 10/16/2022.     Review of Systems  Review of Systems  N/a Physical Exam  BP 128/70 (BP Location: Left Arm, Patient Position: Sitting, Cuff Size: Normal)   Pulse 71   Wt 188 lb 12.8 oz (85.6 kg)   SpO2 96%   BMI 24.91 kg/m   Wt Readings from Last 5 Encounters:  10/16/22 188 lb 12.8 oz (85.6 kg)  07/28/22 194 lb (88 kg)  07/09/22 193 lb 9.6 oz (87.8 kg)  07/08/22 196 lb (88.9 kg)  06/24/22 196 lb 8 oz (89.1 kg)    BMI Readings from Last 5 Encounters:  10/16/22 24.91 kg/m  07/28/22 25.60 kg/m  07/09/22 25.54 kg/m  07/08/22 25.28 kg/m  06/24/22 26.65 kg/m     Physical Exam General: Sitting in chair, no acute distress Eyes: EOMI, no icterus Neck: Supple, no JVP Pulmonary: Good air movement, normal work of breathing Cardiovascular: Warm, no edema Abdomen: Nondistended, bowel sounds present MSK: No synovitis, no joint effusion Neuro: Normal gait, no weakness Psych: Normal mood, full affect  Assessment & Plan:   Postviral cough syndrome: Present for approaching nearly many months.  Gradual improvement in frequency and severity of cough over time.  Given length, possible development of asthma.  No significant improvement with rounds of antibiotics and single  dose of Depo Solu-Medrol.  No improvement with Symbicort.  Although again with time it does not gradually be getting better.  Consider trial with dual bronchodilators in the future, Breztri versus Stiolto.  Nasal congestion/rhinorrhea: Likely biggest contributor to worsening cough for the last week or 2.  Recommend sinus rinses twice a day for a week, Flonase twice a day for a week, nightly antihistamine.  Right middle, right lower lobe GGO's: Seen on CT scan at [facility] 07/07/2022.  Suspect post-COVID inflammatory changes.  On my review this is not consistent with organized pneumonia although anything is possible.  Plan repeat CT scan soon, 47-monthinterval from prior CT scan.  New order placed today.  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  Denies history of trauma, surgical history of the chest that predates 2012, cervical spine surgery or trauma.  Likely postviral versus idiopathic versus related to neuropathy.  No further work-up at this time.   Return in about 3 months (around 01/14/2023).   MLanier Clam MD 10/16/2022   This appointment required 40 minutes of patient care (this includes precharting, chart review, review of results, face-to-face care, etc.).

## 2022-10-20 ENCOUNTER — Ambulatory Visit
Admission: RE | Admit: 2022-10-20 | Discharge: 2022-10-20 | Disposition: A | Discharge status: Home / Self Care | Payer: Medicare Other | Source: Ambulatory Visit | Attending: Pulmonary Disease | Admitting: Pulmonary Disease

## 2022-10-20 DIAGNOSIS — R9389 Abnormal findings on diagnostic imaging of other specified body structures: Secondary | ICD-10-CM

## 2022-10-21 ENCOUNTER — Encounter: Payer: Self-pay | Admitting: Family Medicine

## 2022-10-21 ENCOUNTER — Ambulatory Visit (INDEPENDENT_AMBULATORY_CARE_PROVIDER_SITE_OTHER): Payer: Medicare Other | Admitting: Family Medicine

## 2022-10-21 VITALS — BP 116/80 | HR 82 | Temp 98.1°F | Ht 73.0 in | Wt 187.9 lb

## 2022-10-21 DIAGNOSIS — J329 Chronic sinusitis, unspecified: Secondary | ICD-10-CM | POA: Diagnosis not present

## 2022-10-21 DIAGNOSIS — E1165 Type 2 diabetes mellitus with hyperglycemia: Secondary | ICD-10-CM | POA: Diagnosis not present

## 2022-10-21 DIAGNOSIS — R5381 Other malaise: Secondary | ICD-10-CM

## 2022-10-21 LAB — COMPREHENSIVE METABOLIC PANEL
ALT: 11 U/L (ref 0–53)
AST: 11 U/L (ref 0–37)
Albumin: 3.9 g/dL (ref 3.5–5.2)
Alkaline Phosphatase: 68 U/L (ref 39–117)
BUN: 13 mg/dL (ref 6–23)
CO2: 25 mEq/L (ref 19–32)
Calcium: 9.3 mg/dL (ref 8.4–10.5)
Chloride: 96 mEq/L (ref 96–112)
Creatinine, Ser: 0.8 mg/dL (ref 0.40–1.50)
GFR: 91.66 mL/min (ref >60.00)
Glucose, Bld: 319 mg/dL — ABNORMAL HIGH (ref 70–99)
Potassium: 4.6 mEq/L (ref 3.5–5.1)
Sodium: 130 mEq/L — ABNORMAL LOW (ref 135–145)
Total Bilirubin: 0.7 mg/dL (ref 0.2–1.2)
Total Protein: 7.5 g/dL (ref 6.0–8.3)

## 2022-10-21 LAB — CBC WITH DIFFERENTIAL/PLATELET
Basophils Absolute: 0.1 10*3/uL (ref 0.0–0.1)
Basophils Relative: 1 % (ref 0.0–3.0)
Eosinophils Absolute: 0.1 10*3/uL (ref 0.0–0.7)
Eosinophils Relative: 2.1 % (ref 0.0–5.0)
HCT: 43.6 % (ref 39.0–52.0)
Hemoglobin: 15.1 g/dL (ref 13.0–17.0)
Lymphocytes Relative: 25.2 % (ref 12.0–46.0)
Lymphs Abs: 1.5 10*3/uL (ref 0.7–4.0)
MCHC: 34.7 g/dL (ref 30.0–36.0)
MCV: 84.3 fl (ref 78.0–100.0)
Monocytes Absolute: 0.5 10*3/uL (ref 0.1–1.0)
Monocytes Relative: 8.9 % (ref 3.0–12.0)
Neutro Abs: 3.6 10*3/uL (ref 1.4–7.7)
Neutrophils Relative %: 62.8 % (ref 43.0–77.0)
Platelets: 264 10*3/uL (ref 150.0–400.0)
RBC: 5.17 Mil/uL (ref 4.22–5.81)
RDW: 13.6 % (ref 11.5–15.5)
WBC: 5.8 10*3/uL (ref 4.0–10.5)

## 2022-10-21 LAB — POCT GLYCOSYLATED HEMOGLOBIN (HGB A1C): Hemoglobin A1C: 12 % — AB (ref 4.0–5.6)

## 2022-10-21 MED ORDER — AMOXICILLIN-POT CLAVULANATE 875-125 MG PO TABS: 1.0000 | ORAL_TABLET | Freq: Two times a day (BID) | ORAL | 0 refills | Status: DC

## 2022-10-21 MED ORDER — EMPAGLIFLOZIN 10 MG PO TABS: 10.0000 mg | ORAL_TABLET | Freq: Every day | ORAL | 3 refills | Status: DC

## 2022-10-21 NOTE — Progress Notes (Signed)
Established Patient Office Visit  Subjective   Patient ID: Terry Johns, male    DOB: February 20, 1956  Age: 67 y.o. MRN: [id number]  Chief Complaint  Patient presents with   Fatigue        Sore Throat    HPI   Riad has history of atrial flutter, type 2 diabetes with hyperglycemia, Ehlers-Danlos syndrome, history of mitral valve repair he states he has felt poorly for several months really.  He had COVID back in September.  He had persistent cough for months and eventually saw pulmonary.  His cough has improved.  He states for the past few weeks at least has had some persistent nasal congestion with intermittent facial pain and pressure and intermittent sore throat with greenish nasal discharge.  No fever.  Thankfully, cough has not returned other than occasional mild cough.  He has diabetes and last A1c was up but he had declined further medication.  He does take metformin.  We had previously discussed other options such as Jardiance.  He relates several months of increased malaise.  Has had some recent weight loss.  Some urine frequency.  Recent blood sugars fasting have been over 200 fairly frequently.  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   EXTERNAL HEMORRHOIDS 05/17/2009   MITRAL VALVE PROLAPSE 05/17/2009   Past Surgical History:  Procedure Laterality Date   CARDIAC CATHETERIZATION     COLONOSCOPY  2008   FEMORAL ARTERY EXPLORATION Right 12/09/2016   Procedure: Evacuation of Hematoma Right Groin, Repair of Right Superfiical Femoral Artery;  Surgeon: Angelia Mould, MD;  Location: [place];  Service: Vascular;  Laterality: Right;   PVC ABLATION  12/08/2016   PVC ABLATION N/A 12/08/2016   Procedure: PVC Ablation;  Surgeon: Will [name], MD;  Location: [place] [facility];  Service: Cardiovascular;   Laterality: N/A;   RIGHT/LEFT HEART CATH AND CORONARY ANGIOGRAPHY N/A 07/29/2021   Procedure: RIGHT/LEFT HEART CATH AND CORONARY ANGIOGRAPHY;  Surgeon: Burnell Blanks, MD;  Location: [place] [facility];  Service: Cardiovascular;  Laterality: N/A;   SHOULDER ARTHROSCOPY WITH ROTATOR CUFF REPAIR Left 2017   "[name]"   TEE WITHOUT CARDIOVERSION N/A 07/29/2021   Procedure: TRANSESOPHAGEAL ECHOCARDIOGRAM (TEE);  Surgeon: Josue Hector, MD;  Location: [facility] ENDOSCOPY;  Service: Cardiovascular;  Laterality: N/A;    reports that he has quit smoking. His smoking use included cigarettes. He has a 0.30 pack-year smoking history. He has never used smokeless tobacco. He reports that he does not drink alcohol and does not use drugs. family history includes Breast cancer in his mother; Cancer in his father; Diabetes in his father; Hypertension in his father; Parkinson's disease in his father. No Known Allergies  Review of Systems  Constitutional:  Positive for malaise/fatigue. Negative for chills and fever.  HENT:  Positive for sinus pain.   Respiratory:  Negative for shortness of breath.   Cardiovascular:  Negative for chest pain.      Objective:     BP 116/80 (BP Location: Left Arm, Patient Position: Sitting, Cuff Size: Normal)   Pulse 82   Temp 98.1 F (36.7 C) (Oral)   Ht 6' 1"$  (1.854 m)   Wt 187 lb 14.4 oz (85.2 kg)   SpO2 98%   BMI 24.79 kg/m    Physical Exam Vitals reviewed.  Constitutional:      General: He is not in acute distress.    Appearance: He is not toxic-appearing.  HENT:     Right Ear: Tympanic membrane normal.     Left Ear: Tympanic membrane normal.     Mouth/Throat:     Mouth: Mucous membranes are moist.     Pharynx: Oropharynx is clear.  Cardiovascular:     Rate and Rhythm: Normal rate.  Pulmonary:     Effort: No respiratory distress.     Breath sounds: No wheezing or rales.  Musculoskeletal:     Cervical back: Neck supple.   Lymphadenopathy:     Cervical: No cervical adenopathy.  Neurological:     Mental Status: He is alert.      Results for orders placed or performed in visit on 10/21/22  POC HgB A1c  Result Value Ref Range   Hemoglobin A1C 12.0 (A) 4.0 - 5.6 %   HbA1c POC (<> result, manual entry)     HbA1c, POC (prediabetic range)     HbA1c, POC (controlled diabetic range)        The 10-year ASCVD risk score (Arnett DK, et al., 2019) is: 19.4%    Assessment & Plan:   Problem List Items Addressed This Visit       Unprioritized   Type 2 diabetes mellitus with hyperglycemia ([place]) - Primary   Relevant Medications   empagliflozin (JARDIANCE) 10 MG TABS tablet   Other Relevant Orders   POC HgB A1c (Completed)   Other Visit Diagnoses     Sinusitis, unspecified chronicity, unspecified location       Relevant Medications   amoxicillin-clavulanate (AUGMENTIN) 875-125 MG tablet   Malaise       Relevant Orders   CBC with Differential/Platelet   CMP     Presents with several week history of increased malaise.  He describes some acute versus chronic sinusitis symptoms.  Recommend Augmentin 875 mg twice daily for 10 days  -Check further labs with CBC and CMP  -Repeat A1c today up to 12.  He is currently on metformin 1000 mg twice daily.  Add Jardiance 10 mg once daily.  Scale back high glycemic foods.  Set up follow-up in 2 to 3 months.  Return in about 3 months (around 01/19/2023).    Carolann Littler, MD

## 2022-10-22 NOTE — Progress Notes (Signed)
CT demonstrates area of healing with scarring. Likely related to infection last year. Small 4 mm nodule that recommend repeat scan in 1 year.

## 2022-10-28 ENCOUNTER — Other Ambulatory Visit: Payer: Self-pay

## 2022-10-28 DIAGNOSIS — R9389 Abnormal findings on diagnostic imaging of other specified body structures: Secondary | ICD-10-CM

## 2022-11-06 ENCOUNTER — Encounter: Payer: Self-pay | Admitting: Family Medicine

## 2022-11-07 ENCOUNTER — Ambulatory Visit (INDEPENDENT_AMBULATORY_CARE_PROVIDER_SITE_OTHER): Payer: Medicare Other | Admitting: Family Medicine

## 2022-11-07 ENCOUNTER — Encounter: Payer: Self-pay | Admitting: Family Medicine

## 2022-11-07 VITALS — BP 136/68 | HR 91 | Temp 97.6°F | Ht 73.0 in | Wt 180.7 lb

## 2022-11-07 DIAGNOSIS — E1165 Type 2 diabetes mellitus with hyperglycemia: Secondary | ICD-10-CM

## 2022-11-07 DIAGNOSIS — R21 Rash and other nonspecific skin eruption: Secondary | ICD-10-CM

## 2022-11-07 NOTE — Patient Instructions (Signed)
?  pityriasis rosea or similar papulosquamous rash  These are usually self-limited but can last for several weeks.

## 2022-11-07 NOTE — Progress Notes (Signed)
Established Patient Office Visit  Subjective   Patient ID: Terry Johns, male    DOB: 1956-02-20  Age: 67 y.o. MRN: [id number]  Chief Complaint  Patient presents with   Rash    Patient complains of rash, x1 week     HPI   Terry Johns is seen with skin rash which is mostly asymptomatic and only mildly pruritic mostly on his back with some extension anteriorly.  This is bilateral and symmetric.  Slightly scaly surface.  He has had similar rash once previously.  He was treated recently with Augmentin for sinusitis.  Denied any urticaria.  Also recently started on Jardiance.  Denies any fever or other symptoms.  No exacerbating or alleviating factors.  Type 2 diabetes with recent poor control.  Added Jardiance and fasting blood sugars are now 130-150 and he is tightening up diet.  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   EXTERNAL HEMORRHOIDS 05/17/2009   MITRAL VALVE PROLAPSE 05/17/2009   Past Surgical History:  Procedure Laterality Date   CARDIAC CATHETERIZATION     COLONOSCOPY  2008   FEMORAL ARTERY EXPLORATION Right 12/09/2016   Procedure: Evacuation of Hematoma Right Groin, Repair of Right Superfiical Femoral Artery;  Surgeon: Angelia Mould, MD;  Location: [place];  Service: Vascular;  Laterality: Right;   PVC ABLATION  12/08/2016   PVC ABLATION N/A 12/08/2016   Procedure: PVC Ablation;  Surgeon: Will [name], MD;  Location: [place] CV [facility];  Service: Cardiovascular;  Laterality: N/A;   RIGHT/LEFT HEART CATH AND CORONARY ANGIOGRAPHY N/A 07/29/2021   Procedure: RIGHT/LEFT HEART CATH AND CORONARY ANGIOGRAPHY;  Surgeon: Burnell Blanks, MD;  Location: [place] [facility];  Service: Cardiovascular;  Laterality: N/A;   SHOULDER ARTHROSCOPY WITH ROTATOR CUFF REPAIR Left 2017   "[name]"   TEE  WITHOUT CARDIOVERSION N/A 07/29/2021   Procedure: TRANSESOPHAGEAL ECHOCARDIOGRAM (TEE);  Surgeon: Josue Hector, MD;  Location: [facility] ENDOSCOPY;  Service: Cardiovascular;  Laterality: N/A;    reports that he has quit smoking. His smoking use included cigarettes. He has a 0.30 pack-year smoking history. He has never used smokeless tobacco. He reports that he does not drink alcohol and does not use drugs. family history includes Breast cancer in his mother; Cancer in his father; Diabetes in his father; Hypertension in his father; Parkinson's disease in his father. No Known Allergies  Review of Systems  Constitutional:  Negative for chills, fever and weight loss.  Skin:  Positive for rash.      Objective:     BP 136/68 (BP Location: Left Arm, Patient Position: Sitting, Cuff Size: Normal)   Pulse 91   Temp 97.6 F (36.4 C) (Oral)   Ht '6\' 1"'$  (1.854 m)   Wt 180 lb 11.2 oz (82 kg)   SpO2 97%   BMI 23.84 kg/m    Physical Exam Vitals reviewed.  Cardiovascular:     Rate and Rhythm: Normal rate and regular rhythm.  Skin:    Findings: Rash present.     Comments: He has scattered rash on his back very symmetrically with small ovoid areas of erythema with slightly scaly surface.  Neurological:     Mental Status: He is alert.      No results found for any visits on 11/07/22.    The 10-year ASCVD risk score (  Arnett DK, et al., 2019) is: 24.8%    Assessment & Plan:   #1 acute papulosquamous rash involving mostly back with some anterior trunk involvement.  This is pityriasis like in appearance.  Doubt guttate psoriasis.  Doubt drug reaction.  -Offered topical steroid but at this point minimally pruritic. -We explained that these types of rash are usually very self-limited but can take up to 6 weeks to resolve. -Follow-up for any progressive rash or other new symptoms  #2 type 2 diabetes with recent poor control.  Improved by home readings after addition of Jardiance.  Recommend  office follow-up in a few months to reassess A1c   Terry Littler, MD

## 2023-01-19 ENCOUNTER — Ambulatory Visit: Payer: Medicare Other | Admitting: Family Medicine

## 2023-01-27 ENCOUNTER — Ambulatory Visit: Payer: Medicare Other | Admitting: Family Medicine

## 2023-02-06 ENCOUNTER — Ambulatory Visit: Payer: Medicare Other | Admitting: Family Medicine

## 2023-04-07 ENCOUNTER — Other Ambulatory Visit: Payer: Self-pay | Admitting: Family Medicine

## 2023-04-23 ENCOUNTER — Ambulatory Visit: Payer: Medicare Other | Admitting: Cardiology

## 2023-04-23 NOTE — Progress Notes (Signed)
  Electrophysiology Office Note:   Date:  04/24/2023  ID:  Terry Johns, DOB 1956-04-06, MRN [id number]  Primary Cardiologist: Terry Schultz, MD Electrophysiologist: Terry Jorja Loa, MD      History of Present Illness:   Terry Johns is a 67 y.o. male with h/o Ehler Danlos syndrome, MV prolapse with regurg s/p MVR, PVCs s/p abaltion, and atrial flutter seen today for routine electrophysiology followup.   Since last being seen in our clinic the patient reports doing well overall. He has occasional, brief palpitations, but non sustained. He is on baby ASA only.  he denies chest pain, dyspnea, PND, orthopnea, nausea, vomiting, dizziness, syncope, edema, weight gain, or early satiety.   Review of systems complete and found to be negative unless listed in HPI.   EP Information / Studies Reviewed:    EKG is ordered today. Personal review as below.  EKG Interpretation Date/Time:  Friday April 24 2023 11:19:39 EDT Ventricular Rate:  70 PR Interval:  172 QRS Duration:  86 QT Interval:  380 QTC Calculation: 410 R Axis:   24  Text Interpretation: Sinus rhythm Atrial premature complexes Confirmed by Terry Johns [phone]) on 04/24/2023 11:31:01 AM     Physical Exam:   VS:  BP 110/80   Pulse 70   Ht 6' 1.5" (1.867 m)   Wt 184 lb (83.5 kg)   BMI 23.95 kg/m    Wt Readings from Last 3 Encounters:  04/24/23 184 lb (83.5 kg)  11/07/22 180 lb 11.2 oz (82 kg)  10/21/22 187 lb 14.4 oz (85.2 kg)     GEN: Well nourished, well developed in no acute distress NECK: No JVD; No carotid bruits CARDIAC: Regular rate and rhythm, no murmurs, rubs, gallops RESPIRATORY:  Clear to auscultation without rales, wheezing or rhonchi  ABDOMEN: Soft, non-tender, non-distended EXTREMITIES:  No edema; No deformity   ASSESSMENT AND PLAN:    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   Most recent monitor with a  4.3% burden and a 20 beat run of VT.   Mitral regurgitation Status post MVR.  Plan per primary cardiology. Only on baby ASA as below.  He sees surgical team next week for annual follow up, and Terry clarify they are OK without long-term OAC.    Atrial flutter EKG today shows NSR with a PAC.  Has not been on [facility] without recurrence His arrhythmia was potentially a consequence of surgery.     HFmrEF Continue GDMT as tolerated EF 40-45% 10/2021 Per gen cards  Follow up with Dr. Elberta Johns  in 9 months   Signed, Terry Freer, PA-C

## 2023-04-24 ENCOUNTER — Encounter: Payer: Self-pay | Admitting: Student

## 2023-04-24 ENCOUNTER — Ambulatory Visit: Payer: Medicare Other | Attending: Student | Admitting: Student

## 2023-04-24 VITALS — BP 110/80 | HR 70 | Ht 73.5 in | Wt 184.0 lb

## 2023-04-24 DIAGNOSIS — I4892 Unspecified atrial flutter: Secondary | ICD-10-CM | POA: Diagnosis not present

## 2023-04-24 DIAGNOSIS — I493 Ventricular premature depolarization: Secondary | ICD-10-CM | POA: Diagnosis not present

## 2023-04-24 DIAGNOSIS — Z9889 Other specified postprocedural states: Secondary | ICD-10-CM | POA: Diagnosis not present

## 2023-04-24 NOTE — Patient Instructions (Signed)
Medication Instructions:  Your physician recommends that you continue on your current medications as directed. Please refer to the Current Medication list given to you today.  *If you need a refill on your cardiac medications before your next appointment, please call your pharmacy*  Lab Work: None ordered If you have labs (blood work) drawn today and your tests are completely normal, you will receive your results only by: MyChart Message (if you have MyChart) OR A paper copy in the mail If you have any lab test that is abnormal or we need to change your treatment, we will call you to review the results.  Follow-Up: At [facility], you and your health needs are our priority.  As part of our continuing mission to provide you with exceptional heart care, we have created designated Provider Care Teams.  These Care Teams include your primary Cardiologist (physician) and Advanced Practice Providers (APPs -  Physician Assistants and Nurse Practitioners) who all work together to provide you with the care you need, when you need it.  Your next appointment:   9 month(s)  Provider:   Loman Brooklyn, MD

## 2023-06-16 ENCOUNTER — Encounter: Payer: Self-pay | Admitting: Cardiology

## 2023-06-16 ENCOUNTER — Ambulatory Visit: Payer: Medicare Other | Attending: Cardiology | Admitting: Cardiology

## 2023-06-16 VITALS — BP 116/68 | HR 88 | Ht 73.0 in | Wt 187.0 lb

## 2023-06-16 DIAGNOSIS — I4892 Unspecified atrial flutter: Secondary | ICD-10-CM | POA: Diagnosis not present

## 2023-06-16 DIAGNOSIS — Z9889 Other specified postprocedural states: Secondary | ICD-10-CM | POA: Diagnosis not present

## 2023-06-16 DIAGNOSIS — I493 Ventricular premature depolarization: Secondary | ICD-10-CM

## 2023-06-16 NOTE — Patient Instructions (Signed)

## 2023-06-16 NOTE — Progress Notes (Signed)
  Cardiology Office Note:  .   Date:  06/16/2023  ID:  Terry Johns, DOB 1956-07-20, MRN [id number] PCP: Kristian Covey, MD  [facility] Providers Cardiologist:  Donato Schultz, MD Electrophysiologist:  Regan Lemming, MD     History of Present Illness: .   Terry Johns is a 67 y.o. male Discussed with the use of AI scribe   History of Present Illness   The patient, a 67 year old with a history of Ehlers-Danlos syndrome, mitral valve prolapse with regurgitation status post mitral valve repair, and PVCs post ablation, presents for a follow-up visit. He has experienced brief palpitations and a 20% PVC burden on a prior cardiac monitor. The patient underwent an ablation procedure, which was complicated by a left groin pseudoaneurysm and hematoma, requiring vascular surgery. The most recent monitor showed a reduced PVC burden of 4.3%. The patient's ejection fraction was 40-45% in 2023.  The patient has noticed variations in his heart rate, with a range of 42-123 beats per minute, as recorded by his watch. He also reported an episode of feeling 'wishy-washy' in his head, similar to a sensation he experienced post-heart surgery. This sensation was described as a brief feeling of something shutting off and turning back on.  The patient is currently on low-dose aspirin, metoprolol 100mg  at night, and Jardiance for diabetes. His most recent A1C was 12, which he attributes to poor dietary habits. The patient's mitral valve repair appears to be holding well, with a gradient of . The patient's EKG from August 2023 showed a premature atrial contraction (PAC).       Studies Reviewed: .        Results LABS A1c: 12%  DIAGNOSTIC Echocardiogram: Ejection fraction 40-45%, good overall mitral valve repair, 6 mmHg gradient (2023) Cardiac monitor: 4.3% PVC burden (January 2023) EKG: PAC (04/24/2023)  Risk Assessment/Calculations:            Physical Exam:   VS:  BP 116/68    Pulse 88   Ht 6\' 1"  (1.854 m)   Wt 187 lb (84.8 kg)   SpO2 95%   BMI 24.67 kg/m    Wt Readings from Last 3 Encounters:  06/16/23 187 lb (84.8 kg)  04/24/23 184 lb (83.5 kg)  11/07/22 180 lb 11.2 oz (82 kg)    GEN: Well nourished, well developed in no acute distress NECK: No JVD; No carotid bruits CARDIAC: RRR, no murmurs, no rubs, no gallops RESPIRATORY:  Clear to auscultation without rales, wheezing or rhonchi  ABDOMEN: Soft, non-tender, non-distended EXTREMITIES:  No edema; No deformity   ASSESSMENT AND PLAN: .        Premature Ventricular Contractions (PVCs) Decreased burden from 20% to 4.3% post-ablation. Complication of left groin pseudoaneurysm and hematoma post-ablation required vascular surgery. -Continue current management and monitoring.  Mitral Valve Prolapse with Regurgitation status post Mitral Valve Repair Good overall repair noted with gradient. Ejection fraction 40-45% in 2023 echo. -Continue current management and monitoring. -Dental prophylaxis  Atrial Flutter On low dose aspirin. No current evidence of atrial flutter on recent EKG, but history noted. -Continue aspirin 81mg  daily. Monitor for return of atrial flutter. If returns, anticoagulation is in order. Has smart watch.  Diabetes A1C previously elevated to 12, hopefully improved with Jardiance. -Continue Jardiance for diabetes management  Follow-up in 1 year.               Signed, Donato Schultz, MD

## 2023-06-26 LAB — HM DIABETES EYE EXAM

## 2023-07-14 ENCOUNTER — Ambulatory Visit: Payer: Medicare Other | Admitting: Family Medicine

## 2023-07-14 ENCOUNTER — Ambulatory Visit (INDEPENDENT_AMBULATORY_CARE_PROVIDER_SITE_OTHER): Payer: Medicare Other | Admitting: Family Medicine

## 2023-07-14 VITALS — HR 84

## 2023-07-14 VITALS — BP 108/72 | HR 60 | Temp 98.0°F | Ht 73.0 in | Wt 186.4 lb

## 2023-07-14 DIAGNOSIS — Z23 Encounter for immunization: Secondary | ICD-10-CM

## 2023-07-14 DIAGNOSIS — E1165 Type 2 diabetes mellitus with hyperglycemia: Secondary | ICD-10-CM

## 2023-07-14 DIAGNOSIS — Z7984 Long term (current) use of oral hypoglycemic drugs: Secondary | ICD-10-CM | POA: Diagnosis not present

## 2023-07-14 DIAGNOSIS — Z0000 Encounter for general adult medical examination without abnormal findings: Secondary | ICD-10-CM | POA: Diagnosis not present

## 2023-07-14 DIAGNOSIS — I499 Cardiac arrhythmia, unspecified: Secondary | ICD-10-CM | POA: Diagnosis not present

## 2023-07-14 DIAGNOSIS — I493 Ventricular premature depolarization: Secondary | ICD-10-CM | POA: Diagnosis not present

## 2023-07-14 LAB — POCT GLYCOSYLATED HEMOGLOBIN (HGB A1C): Hemoglobin A1C: 8.9 % — AB (ref 4.0–5.6)

## 2023-07-14 NOTE — Patient Instructions (Signed)
A1C today is 8.9%  Tighten up diet and let's plan on 3 month follow up.

## 2023-07-14 NOTE — Patient Instructions (Signed)
I really enjoyed getting to talk with you today! I am available on Tuesdays and Thursdays for virtual visits if you have any questions or concerns, or if I can be of any further assistance.   CHECKLIST FROM ANNUAL WELLNESS VISIT:  -Follow up (please call to schedule if not scheduled after visit):   -yearly for annual wellness visit with primary care office  Here is a list of your preventive care/health maintenance measures and the plan for each if any are due:  PLAN For any measures below that may be due:  -please request diabetic foot exam, diabetic kidney and hgba1c tests at your visit with Dr. Caryl Never -please get your flu shot at your visit at the office today -can get the updated 2024 covid vaccine and the shingles vaccines at the pharmacy - please let us know when you do and provide copy of receipt so that we can update your chart.  Health Maintenance  Topic Date Due   FOOT EXAM  Never done   Zoster Vaccines- Shingrix (1 of 2) Never done   Diabetic kidney evaluation - Urine ACR  03/18/2023   INFLUENZA VACCINE  04/02/2023   HEMOGLOBIN A1C  04/21/2023   COVID-19 Vaccine (6 - 2023-24 season) 05/03/2023   Diabetic kidney evaluation - eGFR measurement  10/22/2023   OPHTHALMOLOGY EXAM  06/25/2024   Medicare Annual Wellness (AWV)  07/13/2024   DTaP/Tdap/Td (2 - Td or Tdap) 06/09/2027   Colonoscopy  06/15/2027   Pneumonia Vaccine 82+ Years old  Completed   Hepatitis C Screening  Completed   HPV VACCINES  Aged Out    -See a dentist at least yearly  -Get your eyes checked and then per your eye specialist's recommendations  -Other issues addressed today:   -I have included below further information regarding a healthy whole foods based diet, physical activity guidelines for adults, stress management and opportunities for social connections. I hope you find this information useful.    -----------------------------------------------------------------------------------------------------------------------------------------------------------------------------------------------------------------------------------------------------------  NUTRITION: -eat real food: lots of colorful vegetables (half the plate) and fruits -5-7 servings of vegetables and fruits per day (fresh or steamed is best), exp. 2 servings of vegetables with lunch and dinner and 1 servings of  lower sugar fruit (berries) per day. Berries and greens such as kale and collards are great choices.  -consume on a regular basis: whole grains (make sure first ingredient on label contains the word "whole"), fresh fruits, fish, nuts, seeds, healthy oils (such as olive oil, avocado oil, grape seed oil) -ensure oatmeal is NOT quickcooking, but the old fashion rolled oats -may eat small amounts of dairy and lean meat on occasion, but avoid processed meats such as ham, bacon, lunch meat, etc. -drink water -try to avoid fast food and pre-packaged foods, processed meat -most experts advise limiting sodium to < 2300mg  per day, should limit further is any chronic conditions such as high blood pressure, heart disease, diabetes, etc. The American Heart Association advised that < 1500mg  is is ideal -try to avoid foods that contain any ingredients with names you do not recognize  -try to avoid sugar/sweets (except for the natural sugar that occurs in fresh fruit) -try to avoid sweet drinks -try to avoid white rice, white bread, pasta (unless whole grain), white or yellow potatoes  EXERCISE GUIDELINES FOR ADULTS: -if you wish to increase your physical activity, do so gradually and with the approval of your doctor -STOP and seek medical care immediately if you have any chest pain, chest discomfort or trouble  breathing when starting or increasing exercise  -move and stretch your body, legs, feet and arms when sitting for long  periods -Physical activity guidelines for optimal health in adults: -least 150 minutes per week of aerobic exercise (can talk, but not sing) once approved by your doctor, 20-30 minutes of sustained activity or two 10 minute episodes of sustained activity every day.  -resistance training at least 2 days per week if approved by your doctor -balance exercises 3+ days per week:   Stand somewhere where you have something sturdy to hold onto if you lose balance.    1) lift up on toes, start with 5x per day and work up to 20x   2) stand and lift on leg straight out to the side so that foot is a few inches of the floor, start with 5x each side and work up to 20x each side   3) stand on one foot, start with 5 seconds each side and work up to 20 seconds on each side  If you need ideas or help with getting more active:  -Silver sneakers https://tools.silversneakers.com  -Walk with a Doc: http://www.duncan-williams.com/  -try to include resistance (weight lifting/strength building) and balance exercises twice per week: or the following link for ideas: http://castillo-powell.com/  BuyDucts.dk  STRESS MANAGEMENT: -can try meditating, or just sitting quietly with deep breathing while intentionally relaxing all parts of your body for 5 minutes daily -if you need further help with stress, anxiety or depression please follow up with your primary doctor or contact the wonderful folks at [facility]: [phone]  SOCIAL CONNECTIONS: -options in [place] if you wish to engage in more social and exercise related activities:  -Silver sneakers https://tools.silversneakers.com  -Walk with a Doc: http://www.duncan-williams.com/  -Check out the [facility] Active Adults 50+ section on the [place] of [employer] (hiking clubs, book clubs, cards and games, chess, exercise classes, aquatic classes and much more) -  see the website for details: https://www.[place]-.gov/departments/parks-recreation/active-adults50  -YouTube has lots of exercise videos for different ages and abilities as well  -[facility] (a variety of indoor and outdoor inperson activities for adults). ([phone]. [address].  -Virtual Online Classes (a variety of topics): see seniorplanet.org or call [phone]  -consider volunteering at a school, hospice center, church, senior center or elsewhere

## 2023-07-14 NOTE — Progress Notes (Signed)
PATIENT CHECK-IN and HEALTH RISK ASSESSMENT QUESTIONNAIRE:  -completed by phone/video for upcoming Medicare Preventive Visit  Pre-Visit Check-in:  PLEASE CHECK FIRST UNDER ROOMING TAB, then QUESTIONNAIRE to see if patient completed online AWV questions. If so use those to complete some of these question ahead of time.   1)Vitals (height, wt, BP, etc) - record in vitals section for visit on day of visit Request home vitals (wt, BP, etc.) and enter into vitals, THEN update Vital Signs SmartPhrase below at the top of the HPI. See below.  2)Review and Update Medications, Allergies PMH, Surgeries, Social history in Epic 3)Hospitalizations in the last year with date/reason?  no  4)Review and Update Care Team (patient's specialists) in Epic 5) Complete PHQ9 in Epic  6) Complete Fall Screening in Epic 7)Review all Health Maintenance Due and order under PCP if not done.  Medicare Wellness Patient Questionnaire:  Answer theses question about your habits: Do you drink alcohol? No  Have you ever smoked? Yes  Quit date if applicable? In late teens early 20's   Do you use smokeless tobacco? No  Do you use an illicit drugs? No  Do you exercises?  30 minutes 4 days per week, plays pickle ball, walking Are you sexually active? Yes Number of partners?1  Typical breakfast Cup of coffee, muffin and/or oatmeal when deci Typical lunch leftovers, and/or sandwich  Typical dinner fish, beef chicken, vegetables  Typical snacks: not often but when decided will chips, crackers, fruits, nuts and/or cheese   Beverages: milk, coffee, water, soda occasionally   Answer theses question about you: Can you perform most household chores?yes  Do you feel that you have a problem with memory?no Do you balance your checkbook and or bank acounts? No wife does  Are you deaf or have significant trouble hearing? No  Do you have difficulty seeing, even when wearing glasses or contact lenses? No   Do you feel safe at home?  Yes  Last dentist visit? October  Do you need assistance with any of the following: Please note if so - none  Driving?  Feeding yourself?  Getting from bed to chair?  Getting to the toilet?  Bathing or showering?  Dressing yourself?  Managing money?  Climbing a flight of stairs  Preparing meals?    Do you have Advanced Directives in place (Living Will, Healthcare Power or Attorney)? Yes    Last eye Exam and location? [facility]-Dr Lorin Picket    Do you currently use prescribed or non-prescribed narcotic or opioid pain medications? No   Do you have a history or close family history of breast, ovarian, tubal or peritoneal cancer or a family member with BRCA (breast cancer susceptibility 1 and 2) gene mutations? Mother had breast cancer   Patient stated that he has an in office visit scheduled for later today can we not just use those. Gave me HR from his Apple Watch Request home vitals (wt, BP, etc.) and enter into vitals, THEN update Vital Signs SmartPhrase below at the top of the HPI. See below.   Nurse/Assistant Credentials/time stamp: Dorris Fetch, CMA 07/14/23 at 12:54 PM    ----------------------------------------------------------------------------------------------------------------------------------------------------------------------------------------------------------------------  Because this visit was a virtual/telehealth visit, some criteria may be missing or patient reported. Any vitals not documented were not able to be obtained and vitals that have been documented are patient reported.    MEDICARE ANNUAL PREVENTIVE CARE VISIT WITH PROVIDER (Welcome to Medicare, initial annual wellness or annual wellness exam)  Virtual Visit via Phone Note  I connected with [name] on 07/14/23  by phone  and verified that I am speaking with the correct person using two identifiers.  Location patient: home Location provider:work or home office Persons  participating in the virtual visit: patient, provider  Concerns and/or follow up today: reports doing great! Seeing PCP today.    See HM section in Epic for other details of completed HM.    ROS: negative for report of fevers, unintentional weight loss, vision changes, vision loss, hearing loss or change, chest pain, sob, hemoptysis, melena, hematochezia, hematuria, falls, bleeding or bruising, thoughts of suicide or self harm, memory loss  Patient-completed extensive health risk assessment - reviewed and discussed with the patient: See Health Risk Assessment completed with patient prior to the visit either above or in recent phone note. This was reviewed in detailed with the patient today and appropriate recommendations, orders and referrals were placed as needed per Summary below and patient instructions.   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   EXTERNAL HEMORRHOIDS 05/17/2009   MITRAL VALVE PROLAPSE 05/17/2009    Past Surgical History:  Procedure Laterality Date   CARDIAC CATHETERIZATION     COLONOSCOPY  2008   FEMORAL ARTERY EXPLORATION Right 12/09/2016   Procedure: Evacuation of Hematoma Right Groin, Repair of Right Superfiical Femoral Artery;  Surgeon: Chuck Hint, MD;  Location: [facility] OR;  Service: Vascular;  Laterality: Right;   PVC ABLATION  12/08/2016   PVC ABLATION N/A 12/08/2016   Procedure: PVC Ablation;  Surgeon: Will Jorja Loa, MD;  Location: [facility] INVASIVE CV LAB;  Service:  Cardiovascular;  Laterality: N/A;   RIGHT/LEFT HEART CATH AND CORONARY ANGIOGRAPHY N/A 07/29/2021   Procedure: RIGHT/LEFT HEART CATH AND CORONARY ANGIOGRAPHY;  Surgeon: Kathleene Hazel, MD;  Location: [facility] INVASIVE CV LAB;  Service: Cardiovascular;  Laterality: N/A;   SHOULDER ARTHROSCOPY WITH ROTATOR CUFF REPAIR Left 2017   "[name]"   TEE WITHOUT CARDIOVERSION N/A 07/29/2021   Procedure: TRANSESOPHAGEAL ECHOCARDIOGRAM (TEE);  Surgeon: Wendall Stade, MD;  Location: [facility] ENDOSCOPY;  Service: Cardiovascular;  Laterality: N/A;    Social History   Socioeconomic History   Marital status: Married    Spouse name: Not on file   Number of children: Not on file   Years of education: 16   Highest education level: Bachelor's degree (e.g., BA, AB, BS)  Occupational History   Not on file  Tobacco Use   Smoking status: Former    Current packs/day: 0.10    Average packs/day: 0.1 packs/day for 3.0 years (0.3 ttl pk-yrs)    Types: Cigarettes   Smokeless tobacco: Never   Tobacco comments:    Did smoke some in his 35s.   Vaping Use   Vaping status: Never Used  Substance and Sexual Activity   Alcohol use: No   Drug use: No   Sexual activity: Yes  Other Topics Concern   Not on file  Social History Narrative   Not on file   Social Determinants of Health  Financial Resource Strain: Low Risk  (07/11/2023)   Overall Financial Resource Strain (CARDIA)    Difficulty of Paying Living Expenses: Not hard at all  Food Insecurity: No Food Insecurity (07/11/2023)   Hunger Vital Sign    Worried About Running Out of Food in the Last Year: Never true    Ran Out of Food in the Last Year: Never true  Transportation Needs: No Transportation Needs (07/11/2023)   PRAPARE - Administrator, Civil Service (Medical): No    Lack of Transportation (Non-Medical): No  Physical Activity: Insufficiently Active (07/11/2023)   Exercise Vital Sign    Days of Exercise per Week: 4 days     Minutes of Exercise per Session: 30 min  Stress: No Stress Concern Present (07/11/2023)   [employer] of Occupational Health - Occupational Stress Questionnaire    Feeling of Stress : Not at all  Social Connections: Unknown (07/11/2023)   Social Connection and Isolation Panel [NHANES]    Frequency of Communication with Friends and Family: More than three times a week    Frequency of Social Gatherings with Friends and Family: More than three times a week    Attends Religious Services: Patient declined    Database administrator or Organizations: Yes    Attends Engineer, structural: More than 4 times per year    Marital Status: Married  Catering manager Violence: Not At Risk (07/08/2022)   Humiliation, Afraid, Rape, and Kick questionnaire    Fear of Current or Ex-Partner: No    Emotionally Abused: No    Physically Abused: No    Sexually Abused: No    Family History  Problem Relation Age of Onset   Breast cancer Mother    Diabetes Father    Hypertension Father    Cancer Father        lung, basal cell, melanoma   Parkinson's disease Father    Colon cancer Neg Hx    Esophageal cancer Neg Hx    Rectal cancer Neg Hx    Stomach cancer Neg Hx    Colon polyps Neg Hx     Current Outpatient Medications on File Prior to Visit  Medication Sig Dispense Refill   aspirin EC 81 MG tablet Take 81 mg by mouth in the morning. Swallow whole.     empagliflozin (JARDIANCE) 10 MG TABS tablet Take 1 tablet (10 mg total) by mouth daily before breakfast. 90 tablet 3   glucose blood (ACCU-CHEK GUIDE) test strip Use as instructed to check blood sugars twice weekly. DX E11.65 200 strip 0   metFORMIN (GLUCOPHAGE) 500 MG tablet TAKE 2 TABLETS BY MOUTH TWICE A DAY WITH MEALS 360 tablet 1   metoprolol succinate (TOPROL-XL) 100 MG 24 hr tablet TAKE 1 TABLET BY MOUTH DAILY. TAKE WITH OR IMMEDIATELY FOLLOWING A MEAL. 90 tablet 3   sildenafil (VIAGRA) 100 MG tablet TAKE 1/2 TO 1 TABLET BY MOUTH EVERY  DAY AS NEEDED 10 tablet 5   No current facility-administered medications on file prior to visit.    No Known Allergies     Physical Exam Vitals requested from patient and listed below if patient had equipment and was able to obtain at home for this virtual visit: Vitals:   07/14/23 1253  Pulse: 84   Estimated body mass index is 24.67 kg/m as calculated from the following:   Height as of 06/16/23: 6\' 1"  (1.854 m).   Weight as of 06/16/23: 187 lb (84.8 kg).  EKG (  optional): deferred due to virtual visit  GENERAL: alert, oriented, no acute distress detected; full vision exam deferred due to pandemic and/or virtual encounter  PSYCH/NEURO: pleasant and cooperative, no obvious depression or anxiety, speech and thought processing grossly intact, Cognitive function grossly intact  Flowsheet Row Office Visit from 10/05/2019 in [facility] [place]  PHQ-9 Total Score 0           10/21/2022    9:32 AM 08/08/2022    2:03 PM 07/08/2022    2:19 PM 01/15/2022   11:50 AM 11/19/2021   12:24 PM  Depression screen PHQ 2/9  Decreased Interest 0 0 0 0 0  Down, Depressed, Hopeless 0 0 0 0 0  PHQ - 2 Score 0 0 0 0 0       07/08/2022    2:21 PM 10/20/2022    3:18 PM 10/21/2022    9:32 AM 07/10/2023    9:22 AM 07/14/2023   10:04 AM  Fall Risk  Falls in the past year? 0 0 0 0 0  Was there an injury with Fall? 0  0  0  Fall Risk Category Calculator 0  0  0  Fall Risk Category (Retired) Low      (RETIRED) Patient Fall Risk Level Low fall risk      Patient at Risk for Falls Due to No Fall Risks  No Fall Risks    Fall risk Follow up Falls prevention discussed  Falls evaluation completed       SUMMARY AND PLAN:  Encounter for Medicare annual wellness exam   Discussed applicable health maintenance/preventive health measures and advised and referred or ordered per patient preferences: -he is seeing PCP for follow up of diabetes today per his report - plans to do labs,  foot exam there -discussed vaccines due and recommended per CDC, he plans to get the flu shot at the office today and can do the others at the pharmacy  Health Maintenance  Topic Date Due   FOOT EXAM  Never done   Zoster Vaccines- Shingrix (1 of 2) Never done   Diabetic kidney evaluation - Urine ACR  03/18/2023   INFLUENZA VACCINE  04/02/2023   HEMOGLOBIN A1C  04/21/2023   COVID-19 Vaccine (6 - 2023-24 season) 05/03/2023   Diabetic kidney evaluation - eGFR measurement  10/22/2023   OPHTHALMOLOGY EXAM  06/25/2024   Medicare Annual Wellness (AWV)  07/13/2024   DTaP/Tdap/Td (2 - Td or Tdap) 06/09/2027   Colonoscopy  06/15/2027   Pneumonia Vaccine 6+ Years old  Completed   Hepatitis C Screening  Completed   HPV VACCINES  Aged [employer] and counseling on the following was provided based on the above review of health and a plan/checklist for the patient, along with additional information discussed, was provided for the patient in the patient instructions :   -Provided  safe balance exercises that can be done at home to improve balance and discussed exercise guidelines for adults with include balance exercises at least 3 days per week.  -Advised and counseled on a healthy lifestyle - including the importance of a healthy diet, regular physical activity, social connections and stress management. -Reviewed patient's current diet. Advised and counseled on a whole foods based healthy diet - specifically discussed choices for treating diabetes as he reports interest in less medications. Advised could take several some time of eating a healthy whole foods based, unprocessed, low sugar diet before could potentially reduce medications - would  need to work with PCP - but some patients are able to reduce medications with lifestyle changes.  A summary of a healthy diet was provided in the Patient Instructions.  -reviewed patient's current physical activity level and discussed exercise guidelines for  adults. Discussed community resources and ways to meet exercise guidelines for adults - discussed adding in some muscle/strength building. He is considering silver sneakers.  -Advise yearly dental visits at minimum and regular eye exams   Follow up: see patient instructions   Patient Instructions  I really enjoyed getting to talk with you today! I am available on Tuesdays and Thursdays for virtual visits if you have any questions or concerns, or if I can be of any further assistance.   CHECKLIST FROM ANNUAL WELLNESS VISIT:  -Follow up (please call to schedule if not scheduled after visit):   -yearly for annual wellness visit with primary care office  Here is a list of your preventive care/health maintenance measures and the plan for each if any are due:  PLAN For any measures below that may be due:  -please request diabetic foot exam, diabetic kidney and hgba1c tests at your visit with Dr. Caryl Never -please get your flu shot at your visit at the office today -can get the updated 2024 covid vaccine and the shingles vaccines at the pharmacy - please let us know when you do and provide copy of receipt so that we can update your chart.  Health Maintenance  Topic Date Due   FOOT EXAM  Never done   Zoster Vaccines- Shingrix (1 of 2) Never done   Diabetic kidney evaluation - Urine ACR  03/18/2023   INFLUENZA VACCINE  04/02/2023   HEMOGLOBIN A1C  04/21/2023   COVID-19 Vaccine (6 - 2023-24 season) 05/03/2023   Diabetic kidney evaluation - eGFR measurement  10/22/2023   OPHTHALMOLOGY EXAM  06/25/2024   Medicare Annual Wellness (AWV)  07/13/2024   DTaP/Tdap/Td (2 - Td or Tdap) 06/09/2027   Colonoscopy  06/15/2027   Pneumonia Vaccine 59+ Years old  Completed   Hepatitis C Screening  Completed   HPV VACCINES  Aged Out    -See a dentist at least yearly  -Get your eyes checked and then per your eye specialist's recommendations  -Other issues addressed today:   -I have included below  further information regarding a healthy whole foods based diet, physical activity guidelines for adults, stress management and opportunities for social connections. I hope you find this information useful.   -----------------------------------------------------------------------------------------------------------------------------------------------------------------------------------------------------------------------------------------------------------  NUTRITION: -eat real food: lots of colorful vegetables (half the plate) and fruits -5-7 servings of vegetables and fruits per day (fresh or steamed is best), exp. 2 servings of vegetables with lunch and dinner and 1 servings of  lower sugar fruit (berries) per day. Berries and greens such as kale and collards are great choices.  -consume on a regular basis: whole grains (make sure first ingredient on label contains the word "whole"), fresh fruits, fish, nuts, seeds, healthy oils (such as olive oil, avocado oil, grape seed oil) -ensure oatmeal is NOT quickcooking, but the old fashion rolled oats -may eat small amounts of dairy and lean meat on occasion, but avoid processed meats such as ham, bacon, lunch meat, etc. -drink water -try to avoid fast food and pre-packaged foods, processed meat -most experts advise limiting sodium to < 2300mg  per day, should limit further is any chronic conditions such as high blood pressure, heart disease, diabetes, etc. The American Heart Association advised that < 1500mg  is  is ideal -try to avoid foods that contain any ingredients with names you do not recognize  -try to avoid sugar/sweets (except for the natural sugar that occurs in fresh fruit) -try to avoid sweet drinks -try to avoid white rice, white bread, pasta (unless whole grain), white or yellow potatoes  EXERCISE GUIDELINES FOR ADULTS: -if you wish to increase your physical activity, do so gradually and with the approval of your doctor -STOP and seek  medical care immediately if you have any chest pain, chest discomfort or trouble breathing when starting or increasing exercise  -move and stretch your body, legs, feet and arms when sitting for long periods -Physical activity guidelines for optimal health in adults: -least 150 minutes per week of aerobic exercise (can talk, but not sing) once approved by your doctor, 20-30 minutes of sustained activity or two 10 minute episodes of sustained activity every day.  -resistance training at least 2 days per week if approved by your doctor -balance exercises 3+ days per week:   Stand somewhere where you have something sturdy to hold onto if you lose balance.    1) lift up on toes, start with 5x per day and work up to 20x   2) stand and lift on leg straight out to the side so that foot is a few inches of the floor, start with 5x each side and work up to 20x each side   3) stand on one foot, start with 5 seconds each side and work up to 20 seconds on each side  If you need ideas or help with getting more active:  -Silver sneakers https://tools.silversneakers.com  -Walk with a Doc: http://www.duncan-williams.com/  -try to include resistance (weight lifting/strength building) and balance exercises twice per week: or the following link for ideas: http://castillo-powell.com/  BuyDucts.dk  STRESS MANAGEMENT: -can try meditating, or just sitting quietly with deep breathing while intentionally relaxing all parts of your body for 5 minutes daily -if you need further help with stress, anxiety or depression please follow up with your primary doctor or contact the wonderful folks at [facility]: [phone]  SOCIAL CONNECTIONS: -options in [place] if you wish to engage in more social and exercise related activities:  -Silver sneakers https://tools.silversneakers.com  -Walk with a  Doc: http://www.duncan-williams.com/  -Check out the [facility] Active Adults 50+ section on the [place] [employer] (hiking clubs, book clubs, cards and games, chess, exercise classes, aquatic classes and much more) - see the website for details: https://www.[place].gov/departments/parks-recreation/active-adults50  -YouTube has lots of exercise videos for different ages and abilities as well  -[facility] (a variety of indoor and outdoor inperson activities for adults). [phone]. [address]  -Virtual Online Classes (a variety of topics): see seniorplanet.org or call [phone]  -consider volunteering at a school, hospice center, church, senior center or elsewhere           Terressa Koyanagi, DO

## 2023-07-14 NOTE — Progress Notes (Signed)
Established Patient Office Visit  Subjective   Patient ID: Terry Johns, male    DOB: 1956-04-18  Age: 67 y.o. MRN: [id number]  Chief Complaint  Patient presents with   Medical Management of Chronic Issues    HPI   Jacquez is seen for medical follow-up.  He has history of atrial flutter, frequent PVCs, type 2 diabetes, Ehlers-Danlos syndrome, history of mitral valve repair.  He currently takes metformin and Jardiance for his diabetes.  He states his fasting sugars usually range from 126 and 184.  His last A1c was very high at 12.0.  He states has been very poorly compliant with diet.  He is exercising more consistently.  Had recent diabetic eye exam which showed no retinopathy.  Denies any recent chest pains.  No dyspnea.  No polyuria or polydipsia.  Appetite and weight stable.  He has frequent irregular heartbeats.  Does drink about 2 cups of coffee in the morning.  No alcohol.  Does take metoprolol 100 mg daily.  No recent dizziness or syncope.  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   EXTERNAL HEMORRHOIDS 05/17/2009   MITRAL VALVE PROLAPSE 05/17/2009   Past Surgical History:  Procedure Laterality Date   CARDIAC CATHETERIZATION     COLONOSCOPY  2008   FEMORAL ARTERY EXPLORATION Right 12/09/2016   Procedure: Evacuation of Hematoma Right Groin, Repair of Right Superfiical Femoral Artery;  Surgeon: Chuck Hint, MD;  Location: [facility] OR;  Service: Vascular;  Laterality: Right;   PVC ABLATION  12/08/2016   PVC ABLATION N/A 12/08/2016   Procedure: PVC Ablation;  Surgeon: Will Jorja Loa, MD;  Location: [facility] INV[facility]IVE [facility] LAB;  Service: Cardiovascular;  Laterality: N/A;   RIGHT/LEFT HEART CATH AND CORONARY ANGIOGRAPHY N/A 07/29/2021   Procedure: RIGHT/LEFT HEART CATH AND  CORONARY ANGIOGRAPHY;  Surgeon: Kathleene Hazel, MD;  Location: [facility];  Service: Cardiovascular;  Laterality: N/A;   SHOULDER ARTHROSCOPY WITH ROTATOR CUFF REPAIR Left 2017   "[name]"   TEE WITHOUT CARDIOVERSION N/A 07/29/2021   Procedure: TRANSESOPHAGEAL ECHOCARDIOGRAM (TEE);  Surgeon: Wendall Stade, MD;  Location: [facility] ENDOSCOPY;  Service: Cardiovascular;  Laterality: N/A;    reports that he has quit smoking. His smoking use included cigarettes. He has a 0.3 pack-year smoking history. He has never used smokeless tobacco. He reports that he does not drink alcohol and does not use drugs. family history includes Breast cancer in his mother; Cancer in his father; Diabetes in his father; Hypertension in his father; Parkinson's disease in his father. No Known Allergies  Review of Systems  Constitutional:  Negative for malaise/fatigue.  Eyes:  Negative for blurred vision.  Respiratory:  Negative for shortness of breath.   Cardiovascular:  Negative for chest pain.  Neurological:  Negative for dizziness, weakness and headaches.      Objective:     BP 108/72 (BP Location: Left Arm, Patient Position: Sitting, Cuff Size: Normal)   Pulse 60   Temp 98 F (36.7 C) (Oral)   Ht 6\' 1"  (1.854 m)   Wt 186 lb 6.4 oz (84.6 kg)   SpO2 97%   BMI 24.59 kg/m  BP Readings from Last 3 Encounters:  07/14/23 108/72  06/16/23 116/68  04/24/23 110/80   Wt Readings from Last 3  Encounters:  07/14/23 186 lb 6.4 oz (84.6 kg)  06/16/23 187 lb (84.8 kg)  04/24/23 184 lb (83.5 kg)      Physical Exam Vitals reviewed.  Constitutional:      General: He is not in acute distress.    Appearance: He is not ill-appearing or toxic-appearing.  Cardiovascular:     Rate and Rhythm: Normal rate.     Comments: Irregular rhythm with rate of around 60 Pulmonary:     Effort: Pulmonary effort is normal.     Breath sounds: Normal breath sounds. No wheezing or rales.  Musculoskeletal:      Right lower leg: No edema.     Left lower leg: No edema.  Neurological:     Mental Status: He is alert.      Results for orders placed or performed in visit on 07/14/23  POC HgB A1c  Result Value Ref Range   Hemoglobin A1C 8.9 (A) 4.0 - 5.6 %   HbA1c POC (<> result, manual entry)     HbA1c, POC (prediabetic range)     HbA1c, POC (controlled diabetic range)      Last CBC Lab Results  Component Value Date   WBC 5.8 10/21/2022   HGB 15.1 10/21/2022   HCT 43.6 10/21/2022   MCV 84.3 10/21/2022   MCH 29.3 08/25/2021   RDW 13.6 10/21/2022   PLT 264.0 10/21/2022   Last metabolic panel Lab Results  Component Value Date   GLUCOSE 319 (H) 10/21/2022   NA 130 (L) 10/21/2022   K 4.6 10/21/2022   CL 96 10/21/2022   CO2 25 10/21/2022   BUN 13 10/21/2022   CREATININE 0.80 10/21/2022   GFR 91.66 10/21/2022   CALCIUM 9.3 10/21/2022   PROT 7.5 10/21/2022   ALBUMIN 3.9 10/21/2022   BILITOT 0.7 10/21/2022   ALKPHOS 68 10/21/2022   AST 11 10/21/2022   ALT 11 10/21/2022   ANIONGAP 12 08/25/2021   Last lipids Lab Results  Component Value Date   CHOL 198 03/17/2022   HDL 68.60 03/17/2022   LDLCALC 100 (H) 03/17/2022   TRIG 147.0 03/17/2022   CHOLHDL 3 03/17/2022   Last hemoglobin A1c Lab Results  Component Value Date   HGBA1C 8.9 (A) 07/14/2023   Last thyroid functions Lab Results  Component Value Date   TSH 1.53 03/17/2022      The 10-year ASCVD risk score (Arnett DK, et al., 2019) is: 18.7%    Assessment & Plan:   #1 type 2 diabetes poorly controlled but improved compared with last A1c.  A1c today is 8.9%.  He is compliant with metformin and Jardiance.  We discussed options including additional medication such as insulin and he would like to give this 3 more months of lifestyle modification.  Would not recommend medication such as Actos with his history of EF 40 to 45%.  Also discussed consider C-peptide at follow-up.  Our lab was closed when he was seen today  and will plan urine microalbumin screen at follow-up as well.  Recent diabetic eye exam no retinopathy  #2 irregular heart rhythm-asymptomatic.  We obtained EKG to verify this was not A-fib.  EKG shows sinus rhythm with frequent PVCs.  No evidence for A-fib.  Continue metoprolol.  Continue to minimize caffeine intake.   Return in about 3 months (around 10/14/2023).    Evelena Peat, MD

## 2023-08-19 NOTE — Telephone Encounter (Signed)
Results abstracted and Care Team updated

## 2023-09-09 ENCOUNTER — Other Ambulatory Visit: Payer: Self-pay | Admitting: Nurse Practitioner

## 2023-10-04 ENCOUNTER — Other Ambulatory Visit: Payer: Self-pay | Admitting: Family Medicine

## 2023-10-08 ENCOUNTER — Other Ambulatory Visit: Payer: Self-pay | Admitting: Family Medicine

## 2023-10-09 ENCOUNTER — Ambulatory Visit (HOSPITAL_COMMUNITY)
Admission: RE | Admit: 2023-10-09 | Discharge: 2023-10-09 | Disposition: A | Discharge status: Home / Self Care | Payer: Medicare Other | Source: Ambulatory Visit | Attending: Pulmonary Disease | Admitting: Pulmonary Disease

## 2023-10-09 DIAGNOSIS — R9389 Abnormal findings on diagnostic imaging of other specified body structures: Secondary | ICD-10-CM | POA: Diagnosis present

## 2023-10-14 ENCOUNTER — Ambulatory Visit (INDEPENDENT_AMBULATORY_CARE_PROVIDER_SITE_OTHER): Payer: Medicare Other | Admitting: Family Medicine

## 2023-10-14 VITALS — BP 118/70 | HR 74 | Temp 98.2°F | Wt 174.5 lb

## 2023-10-14 DIAGNOSIS — I4892 Unspecified atrial flutter: Secondary | ICD-10-CM

## 2023-10-14 DIAGNOSIS — E1165 Type 2 diabetes mellitus with hyperglycemia: Secondary | ICD-10-CM | POA: Diagnosis not present

## 2023-10-14 LAB — POCT GLYCOSYLATED HEMOGLOBIN (HGB A1C): Hemoglobin A1C: 7.8 % — AB (ref 4.0–5.6)

## 2023-10-14 LAB — LIPID PANEL
Cholesterol: 163 mg/dL (ref 0–200)
HDL: 62.4 mg/dL (ref >39.00)
LDL Cholesterol: 80 mg/dL (ref 0–99)
NonHDL: 100.75
Total CHOL/HDL Ratio: 3
Triglycerides: 105 mg/dL (ref 0.0–149.0)
VLDL: 21 mg/dL (ref 0.0–40.0)

## 2023-10-14 LAB — COMPREHENSIVE METABOLIC PANEL
ALT: 12 U/L (ref 0–53)
AST: 15 U/L (ref 0–37)
Albumin: 4.1 g/dL (ref 3.5–5.2)
Alkaline Phosphatase: 50 U/L (ref 39–117)
BUN: 17 mg/dL (ref 6–23)
CO2: 24 meq/L (ref 19–32)
Calcium: 9 mg/dL (ref 8.4–10.5)
Chloride: 102 meq/L (ref 96–112)
Creatinine, Ser: 0.91 mg/dL (ref 0.40–1.50)
GFR: 86.7 mL/min (ref >60.00)
Glucose, Bld: 172 mg/dL — ABNORMAL HIGH (ref 70–99)
Potassium: 4.3 meq/L (ref 3.5–5.1)
Sodium: 135 meq/L (ref 135–145)
Total Bilirubin: 0.7 mg/dL (ref 0.2–1.2)
Total Protein: 7.2 g/dL (ref 6.0–8.3)

## 2023-10-14 NOTE — Progress Notes (Signed)
Established Patient Office Visit  Subjective   Patient ID: Terry Johns, male    DOB: September 20, 1955  Age: 68 y.o. MRN: [id number]  Chief Complaint  Patient presents with   Medical Management of Chronic Issues    HPI   Terry Johns is seen for follow-up type 2 diabetes.  His other medical problems include history of atrial flutter, Ehlers-Danlos syndrome, history of mitral valve repair.  Generally doing well.  He has been very diligent with diet and exercise.  Exercising regularly.  Is lost about 12 pounds since last visit.  A1c a year ago was 12 and down to 7.8% today.  He does take Jardiance and metformin and prefers to take less medication.  He has been watching his diet little bit closer.  Denies any recent chest pains or dizziness.  Not monitoring blood sugars regularly.  Does not have any history of significant CAD.  We had discussed getting follow-up labs today including lipids.  He had just mildly elevated LDL cholesterol in the past.  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   EXTERNAL HEMORRHOIDS 05/17/2009   MITRAL VALVE PROLAPSE 05/17/2009   Past Surgical History:  Procedure Laterality Date   CARDIAC CATHETERIZATION     COLONOSCOPY  2008   FEMORAL ARTERY EXPLORATION Right 12/09/2016   Procedure: Evacuation of Hematoma Right Groin, Repair of Right Superfiical Femoral Artery;  Surgeon: Chuck Hint, MD;  Location: [facility] OR;  Service: Vascular;  Laterality: Right;   PVC ABLATION  12/08/2016   PVC ABLATION N/A 12/08/2016   Procedure: PVC Ablation;  Surgeon: Will Jorja Loa, MD;  Location: [facility];  Service: Cardiovascular;  Laterality: N/A;   RIGHT/LEFT HEART CATH AND CORONARY ANGIOGRAPHY N/A 07/29/2021   Procedure: RIGHT/LEFT HEART CATH AND CORONARY ANGIOGRAPHY;  Surgeon: Kathleene Hazel, MD;   Location: [facility];  Service: Cardiovascular;  Laterality: N/A;   SHOULDER ARTHROSCOPY WITH ROTATOR CUFF REPAIR Left 2017   "[name]"   TEE WITHOUT CARDIOVERSION N/A 07/29/2021   Procedure: TRANSESOPHAGEAL ECHOCARDIOGRAM (TEE);  Surgeon: Wendall Stade, MD;  Location: [facility] ENDOSCOPY;  Service: Cardiovascular;  Laterality: N/A;    reports that he has quit smoking. His smoking use included cigarettes. He has a 0.3 pack-year smoking history. He has never used smokeless tobacco. He reports that he does not drink alcohol and does not use drugs. family history includes Breast cancer in his mother; Cancer in his father; Diabetes in his father; Hypertension in his father; Parkinson's disease in his father. No Known Allergies  Review of Systems  Constitutional:  Negative for malaise/fatigue.  Eyes:  Negative for blurred vision.  Respiratory:  Negative for shortness of breath.   Cardiovascular:  Negative for chest pain.  Neurological:  Negative for dizziness, weakness and headaches.      Objective:     BP 118/70 (BP Location: Left Arm, Patient Position: Sitting, Cuff Size: Normal)   Pulse 74   Temp 98.2 F (36.8 C) (Oral)   Wt 174 lb 8 oz (79.2 kg)   SpO2 96%   BMI 23.02 kg/m  BP Readings from Last 3 Encounters:  10/14/23 118/70  07/14/23 108/72  06/16/23 116/68   Wt Readings from Last 3 Encounters:  10/14/23 174 lb 8 oz (79.2 kg)  07/14/23 186 lb 6.4 oz (84.6 kg)  06/16/23  187 lb (84.8 kg)      Physical Exam Vitals reviewed.  Constitutional:      General: He is not in acute distress.    Appearance: He is well-developed. He is not ill-appearing.  Eyes:     Pupils: Pupils are equal, round, and reactive to light.  Neck:     Thyroid: No thyromegaly.  Cardiovascular:     Rate and Rhythm: Normal rate and regular rhythm.  Pulmonary:     Effort: Pulmonary effort is normal. No respiratory distress.     Breath sounds: Normal breath sounds. No wheezing or rales.   Musculoskeletal:     Cervical back: Neck supple.     Right lower leg: No edema.     Left lower leg: No edema.  Neurological:     Mental Status: He is alert and oriented to person, place, and time.      Results for orders placed or performed in visit on 10/14/23  POC HgB A1c  Result Value Ref Range   Hemoglobin A1C 7.8 (A) 4.0 - 5.6 %   HbA1c POC (<> result, manual entry)     HbA1c, POC (prediabetic range)     HbA1c, POC (controlled diabetic range)      Last CBC Lab Results  Component Value Date   WBC 5.8 10/21/2022   HGB 15.1 10/21/2022   HCT 43.6 10/21/2022   MCV 84.3 10/21/2022   MCH 29.3 08/25/2021   RDW 13.6 10/21/2022   PLT 264.0 10/21/2022   Last metabolic panel Lab Results  Component Value Date   GLUCOSE 319 (H) 10/21/2022   NA 130 (L) 10/21/2022   K 4.6 10/21/2022   CL 96 10/21/2022   CO2 25 10/21/2022   BUN 13 10/21/2022   CREATININE 0.80 10/21/2022   GFR 91.66 10/21/2022   CALCIUM 9.3 10/21/2022   PROT 7.5 10/21/2022   ALBUMIN 3.9 10/21/2022   BILITOT 0.7 10/21/2022   ALKPHOS 68 10/21/2022   AST 11 10/21/2022   ALT 11 10/21/2022   ANIONGAP 12 08/25/2021   Last lipids Lab Results  Component Value Date   CHOL 198 03/17/2022   HDL 68.60 03/17/2022   LDLCALC 100 (H) 03/17/2022   TRIG 147.0 03/17/2022   CHOLHDL 3 03/17/2022   Last hemoglobin A1c Lab Results  Component Value Date   HGBA1C 7.8 (A) 10/14/2023      The 10-year ASCVD risk score (Arnett DK, et al., 2019) is: 21.5%    Assessment & Plan:   #1 type 2 diabetes improving with A1c 7.8%.  Patient prefers 3 more months of lifestyle modification to try to get to goal less than 7.  Continue low glycemic diet.  Continue combination of resistance and aerobic exercise.  Set up 22-month follow-up  #2 past history of atrial flutter.  Appears to be in sinus rhythm at this time.  He is also had frequent PVCs in the past but only occasional premature beat noted today on exam.  Continue to  minimize caffeine and alcohol intake.  #3 history of mild hyperlipidemia.  Recheck fasting lipid today.   No follow-ups on file.    Evelena Peat, MD

## 2023-10-14 NOTE — Patient Instructions (Signed)
A1C has improved to 7.8 (goal < 7).  Set up 3 month follow up.

## 2023-10-15 LAB — MICROALBUMIN / CREATININE URINE RATIO
Creatinine,U: 99.4 mg/dL
Microalb Creat Ratio: 7.5 mg/g (ref 0.0–30.0)
Microalb, Ur: 0.7 mg/dL (ref 0.0–1.9)

## 2023-10-19 ENCOUNTER — Encounter: Payer: Self-pay | Admitting: Family Medicine

## 2023-10-19 MED ORDER — ACCU-CHEK GUIDE TEST VI STRP: ORAL_STRIP | 3 refills | Status: AC

## 2023-10-26 ENCOUNTER — Encounter: Payer: Self-pay | Admitting: Pulmonary Disease

## 2023-10-26 NOTE — Progress Notes (Signed)
 Repeat CT of chest is stable - no follow up needed.

## 2023-11-16 ENCOUNTER — Encounter: Payer: Self-pay | Admitting: Family Medicine

## 2023-11-16 MED ORDER — POLYMYXIN B-TRIMETHOPRIM 10000-0.1 UNIT/ML-% OP SOLN: OPHTHALMIC | 0 refills | Status: AC

## 2023-11-25 ENCOUNTER — Other Ambulatory Visit: Payer: Self-pay | Admitting: Family Medicine

## 2023-11-25 DIAGNOSIS — N529 Male erectile dysfunction, unspecified: Secondary | ICD-10-CM

## 2024-01-11 ENCOUNTER — Ambulatory Visit (INDEPENDENT_AMBULATORY_CARE_PROVIDER_SITE_OTHER): Payer: Medicare Other | Admitting: Family Medicine

## 2024-01-11 ENCOUNTER — Encounter: Payer: Self-pay | Admitting: Family Medicine

## 2024-01-11 VITALS — BP 94/66 | HR 78 | Temp 98.1°F | Wt 174.0 lb

## 2024-01-11 DIAGNOSIS — Z7984 Long term (current) use of oral hypoglycemic drugs: Secondary | ICD-10-CM

## 2024-01-11 DIAGNOSIS — E785 Hyperlipidemia, unspecified: Secondary | ICD-10-CM

## 2024-01-11 DIAGNOSIS — E1165 Type 2 diabetes mellitus with hyperglycemia: Secondary | ICD-10-CM

## 2024-01-11 LAB — POCT GLYCOSYLATED HEMOGLOBIN (HGB A1C): Hemoglobin A1C: 8.2 % — AB (ref 4.0–5.6)

## 2024-01-11 MED ORDER — EMPAGLIFLOZIN 10 MG PO TABS: 10.0000 mg | ORAL_TABLET | Freq: Every day | ORAL | 3 refills | Status: DC

## 2024-01-11 NOTE — Progress Notes (Signed)
 Established Patient Office Visit  Subjective   Patient ID: Terry Johns, male    DOB: 1956-07-04  Age: 68 y.o. MRN: [id number]  Chief Complaint  Patient presents with   Medical Management of Chronic Issues    HPI   Terry Johns is seen for follow-up type 2 diabetes.  His other medical problems include history of atrial flutter, PVCs, Ehlers-Danlos syndrome, history of mitral valve repair.  His last A1c was 7.8%.  He feels like he has been fairly diligent with walking about 3 miles per day and doing pickleball but has been somewhat lax with diet in terms of desserts and sweets at least few times per week.  Still eats starch in terms of things like sandwiches.  Not monitoring blood sugars regularly.  He is taking metformin  and Jardiance  regularly.  His weight is stable.  Denies any significant polyuria or polydipsia.  Has had history of mildly elevated lipids in the past but declined statin use.  Denies any recent chest pains.  No exercise intolerance.  Denies any recent dizziness.  He does take metoprolol  but no other blood pressure medications.  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   EXTERNAL HEMORRHOIDS 05/17/2009   MITRAL VALVE PROLAPSE 05/17/2009   Past Surgical History:  Procedure Laterality Date   CARDIAC CATHETERIZATION     COLONOSCOPY  2008   FEMORAL ARTERY EXPLORATION Right 12/09/2016   Procedure: Evacuation of Hematoma Right Groin, Repair of Right Superfiical Femoral Artery;  Surgeon: Dannis Dy, MD;  Location: [facility] OR;  Service: Vascular;  Laterality: Right;   PVC ABLATION  12/08/2016   PVC ABLATION N/A 12/08/2016   Procedure: PVC Ablation;  Surgeon: Will Cortland Ding, MD;  Location: [facility] INVASIVE [facility] LAB;  Service: Cardiovascular;  Laterality: N/A;   RIGHT/LEFT HEART CATH AND CORONARY ANGIOGRAPHY N/A  07/29/2021   Procedure: RIGHT/LEFT HEART CATH AND CORONARY ANGIOGRAPHY;  Surgeon: Odie Benne, MD;  Location: [facility] LAB;  Service: Cardiovascular;  Laterality: N/A;   SHOULDER ARTHROSCOPY WITH ROTATOR CUFF REPAIR Left 2017   "[name]"   TEE WITHOUT CARDIOVERSION N/A 07/29/2021   Procedure: TRANSESOPHAGEAL ECHOCARDIOGRAM (TEE);  Surgeon: Loyde Rule, MD;  Location: [facility] ENDOSCOPY;  Service: Cardiovascular;  Laterality: N/A;    reports that he has quit smoking. His smoking use included cigarettes. He has a 0.3 pack-year smoking history. He has never used smokeless tobacco. He reports that he does not drink alcohol and does not use drugs. family history includes Breast cancer in his mother; Cancer in his father; Diabetes in his father; Hypertension in his father; Parkinson's disease in his father. No Known Allergies  Review of Systems  Constitutional:  Negative for chills, fever and malaise/fatigue.  Eyes:  Negative for blurred vision.  Respiratory:  Negative for shortness of breath.   Cardiovascular:  Negative for chest pain.  Gastrointestinal:  Negative for abdominal pain.  Neurological:  Negative for dizziness, weakness and headaches.      Objective:      BP 94/66 (BP Location: Left Arm, Patient Position: Sitting, Cuff Size: Normal)   Pulse 78   Temp 98.1 F (36.7 C) (Oral)   Wt 174 lb (78.9 kg)   SpO2 95%   BMI 22.96 kg/m  BP Readings from Last 3 Encounters:  01/11/24 94/66  10/14/23 118/70  07/14/23 108/72  Wt Readings from Last 3 Encounters:  01/11/24 174 lb (78.9 kg)  10/14/23 174 lb 8 oz (79.2 kg)  07/14/23 186 lb 6.4 oz (84.6 kg)      Physical Exam Vitals reviewed.  Constitutional:      Appearance: He is well-developed.  Neck:     Thyroid : No thyromegaly.  Cardiovascular:     Rate and Rhythm: Normal rate and regular rhythm.  Pulmonary:     Effort: Pulmonary effort is normal. No respiratory distress.     Breath sounds:  Normal breath sounds. No wheezing or rales.  Musculoskeletal:     Cervical back: Neck supple.  Neurological:     Mental Status: He is alert and oriented to person, place, and time.      Results for orders placed or performed in visit on 01/11/24  POC HgB A1c  Result Value Ref Range   Hemoglobin A1C 8.2 (A) 4.0 - 5.6 %   HbA1c POC (<> result, manual entry)     HbA1c, POC (prediabetic range)     HbA1c, POC (controlled diabetic range)        The 10-year ASCVD risk score (Arnett DK, et al., 2019) is: 14%    Assessment & Plan:   #1 type 2 diabetes suboptimally controlled with A1c today 8.2%.  We discussed ADA guidelines.  Recommend trying to get A1c below 7.  Cannot increase metformin .  Continue Jardiance .  We did discuss GLP-1 medications but he is concerned about any additional weight loss.  We discussed other options such as sulfonylurea or DPP 4 inhibitor.  At this point he would like to give this 3 more months of diet modification.  Already exercising fairly regularly. Set up 91-month follow-up.  Not further to goal at that point consider options as above  #2 hyperlipidemia.  We discussed current guidelines regarding statin use in diabetics.  He declined statin use.  Continue low saturated fat diet.  We discussed options for more healthy fats-complete plant-based versus animal fats   Return in about 3 months (around 04/12/2024).    Glean Lamy, MD

## 2024-03-02 ENCOUNTER — Other Ambulatory Visit: Payer: Self-pay | Admitting: Family Medicine

## 2024-03-02 DIAGNOSIS — N529 Male erectile dysfunction, unspecified: Secondary | ICD-10-CM

## 2024-03-27 ENCOUNTER — Other Ambulatory Visit: Payer: Self-pay | Admitting: Family Medicine

## 2024-04-12 ENCOUNTER — Ambulatory Visit: Payer: Self-pay | Admitting: Family Medicine

## 2024-04-12 ENCOUNTER — Encounter: Payer: Self-pay | Admitting: Family Medicine

## 2024-04-12 ENCOUNTER — Ambulatory Visit: Admitting: Family Medicine

## 2024-04-12 VITALS — BP 124/84 | HR 65 | Temp 98.3°F | Wt 171.2 lb

## 2024-04-12 DIAGNOSIS — R634 Abnormal weight loss: Secondary | ICD-10-CM | POA: Diagnosis not present

## 2024-04-12 DIAGNOSIS — H109 Unspecified conjunctivitis: Secondary | ICD-10-CM

## 2024-04-12 DIAGNOSIS — E1165 Type 2 diabetes mellitus with hyperglycemia: Secondary | ICD-10-CM | POA: Diagnosis not present

## 2024-04-12 LAB — COMPREHENSIVE METABOLIC PANEL WITH GFR
ALT: 12 U/L (ref 0–53)
AST: 12 U/L (ref 0–37)
Albumin: 4.5 g/dL (ref 3.5–5.2)
Alkaline Phosphatase: 59 U/L (ref 39–117)
BUN: 24 mg/dL — ABNORMAL HIGH (ref 6–23)
CO2: 24 meq/L (ref 19–32)
Calcium: 9.4 mg/dL (ref 8.4–10.5)
Chloride: 99 meq/L (ref 96–112)
Creatinine, Ser: 0.81 mg/dL (ref 0.40–1.50)
GFR: 90.38 mL/min (ref >60.00)
Glucose, Bld: 137 mg/dL — ABNORMAL HIGH (ref 70–99)
Potassium: 4.4 meq/L (ref 3.5–5.1)
Sodium: 135 meq/L (ref 135–145)
Total Bilirubin: 0.7 mg/dL (ref 0.2–1.2)
Total Protein: 7.6 g/dL (ref 6.0–8.3)

## 2024-04-12 LAB — CBC WITH DIFFERENTIAL/PLATELET
Basophils Absolute: 0 K/uL (ref 0.0–0.1)
Basophils Relative: 0.9 % (ref 0.0–3.0)
Eosinophils Absolute: 0.2 K/uL (ref 0.0–0.7)
Eosinophils Relative: 3.1 % (ref 0.0–5.0)
HCT: 46 % (ref 39.0–52.0)
Hemoglobin: 15.9 g/dL (ref 13.0–17.0)
Lymphocytes Relative: 28.3 % (ref 12.0–46.0)
Lymphs Abs: 1.4 K/uL (ref 0.7–4.0)
MCHC: 34.6 g/dL (ref 30.0–36.0)
MCV: 85.1 fl (ref 78.0–100.0)
Monocytes Absolute: 0.4 K/uL (ref 0.1–1.0)
Monocytes Relative: 8.8 % (ref 3.0–12.0)
Neutro Abs: 2.9 K/uL (ref 1.4–7.7)
Neutrophils Relative %: 58.9 % (ref 43.0–77.0)
Platelets: 222 K/uL (ref 150.0–400.0)
RBC: 5.4 Mil/uL (ref 4.22–5.81)
RDW: 13.8 % (ref 11.5–15.5)
WBC: 4.8 K/uL (ref 4.0–10.5)

## 2024-04-12 LAB — POCT GLYCOSYLATED HEMOGLOBIN (HGB A1C): Hemoglobin A1C: 7.7 % — AB (ref 4.0–5.6)

## 2024-04-12 LAB — TSH: TSH: 1.95 u[IU]/mL (ref 0.35–5.50)

## 2024-04-12 LAB — SEDIMENTATION RATE: Sed Rate: 19 mm/h (ref 0–20)

## 2024-04-12 MED ORDER — TOBRAMYCIN 0.3 % OP SOLN: 2.0000 [drp] | OPHTHALMIC | 0 refills | Status: AC

## 2024-04-12 NOTE — Progress Notes (Signed)
 Established Patient Office Visit  Subjective   Patient ID: Terry Johns, male    DOB: 1956/08/26  Age: 68 y.o. MRN: [id number]  Chief Complaint  Patient presents with   Medical Management of Chronic Issues    HPI   Terry Johns has history of atrial flutter, type 2 diabetes, Ehlers-Danlos syndrome, history of mitral valve repair.  Seen today for the following issues  Recent poorly controlled diabetes.  Last A1c 8.2%.  Currently on Jardiance  and metformin .  He was reluctant to add additional medications.  He has had some ongoing concerns regarding unintentional weight loss.  Weight in May was 174 pounds at 171 today.  Does play pickle ball regularly.  Has decent appetite and feels like diet is unchanged.  Denies any tachycardia, tremors, or diarrhea.  We discussed possible DPP 4 inhibitor last visit but he was reluctant.  Regarding the weight loss, he denies any night sweats, fever, abdominal pain, chronic headaches, cough, dyspnea, hematuria  He has some recurrent conjunctivitis symptoms both eyes with some redness of the conjunctive in thick yellowish crusted drainage early in the mornings.  No visual changes.  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   EXTERNAL HEMORRHOIDS 05/17/2009   MITRAL VALVE PROLAPSE 05/17/2009   Past Surgical History:  Procedure Laterality Date   CARDIAC CATHETERIZATION     COLONOSCOPY  2008   FEMORAL ARTERY EXPLORATION Right 12/09/2016   Procedure: Evacuation of Hematoma Right Groin, Repair of Right Superfiical Femoral Artery;  Surgeon: Terry GORMAN Blade, MD;  Location: [facility] OR;  Service: Vascular;  Laterality: Right;   PVC ABLATION  12/08/2016   PVC ABLATION N/A 12/08/2016   Procedure: PVC Ablation;  Surgeon: Terry Gladis Norton, MD;  Location: [facility]VAS[facility] LAB;  Service: Cardiovascular;   Laterality: N/A;   RIGHT/LEFT HEART CATH AND CORONARY ANGIOGRAPHY N/A 07/29/2021   Procedure: RIGHT/LEFT HEART CATH AND CORONARY ANGIOGRAPHY;  Surgeon: Terry Terry BIRCH, MD;  Location: [facility]AS[facility] LAB;  Service: Cardiovascular;  Laterality: N/A;   SHOULDER ARTHROSCOPY WITH ROTATOR CUFF REPAIR Left 2017   Terry Johns surgery   TEE WITHOUT CARDIOVERSION N/A 07/29/2021   Procedure: TRANSESOPHAGEAL ECHOCARDIOGRAM (TEE);  Surgeon: Terry Maude JAYSON, MD;  Location: [facility] ENDOSCOPY;  Service: Cardiovascular;  Laterality: N/A;    reports that he has quit smoking. His smoking use included cigarettes. He has a 0.3 pack-year smoking history. He has never used smokeless tobacco. He reports that he does not drink alcohol and does not use drugs. family history includes Breast cancer in his mother; Cancer in his father; Diabetes in his father; Hypertension in his father; Parkinson's disease in his father. No Known Allergies  Review of Systems  Constitutional:  Negative for chills, fever and malaise/fatigue.  Eyes:  Negative for blurred vision.  Respiratory:  Negative for shortness of breath.   Cardiovascular:  Negative for chest pain.  Gastrointestinal:  Negative for abdominal pain.  Genitourinary:  Negative for dysuria and hematuria.  Neurological:  Negative for dizziness, weakness and headaches.      Objective:     BP 124/84   Pulse 65   Temp 98.3 F (36.8 C) (Oral)   Wt 171 lb 3.2 oz (77.7 kg)   SpO2 94%   BMI 22.59 kg/m  BP Readings from Last 3 Encounters:  04/12/24 124/84  01/11/24 94/66  10/14/23  118/70   Wt Readings from Last 3 Encounters:  04/12/24 171 lb 3.2 oz (77.7 kg)  01/11/24 174 lb (78.9 kg)  10/14/23 174 lb 8 oz (79.2 kg)      Physical Exam Vitals reviewed.  Constitutional:      General: He is not in acute distress.    Appearance: He is well-developed. He is not ill-appearing.  HENT:     Right Ear: External ear normal.     Left Ear: External ear normal.   Eyes:     Pupils: Pupils are equal, round, and reactive to light.     Comments: Conjunctive erythematous bilaterally.  He has a small amount of crusted drainage lower lids bilaterally  Neck:     Thyroid : No thyromegaly.  Cardiovascular:     Rate and Rhythm: Normal rate and regular rhythm.  Pulmonary:     Effort: Pulmonary effort is normal. No respiratory distress.     Breath sounds: Normal breath sounds. No wheezing or rales.  Musculoskeletal:     Cervical back: Neck supple.  Neurological:     Mental Status: He is alert and oriented to person, place, and time.      Results for orders placed or performed in visit on 04/12/24  POC HgB A1c  Result Value Ref Range   Hemoglobin A1C 7.7 (A) 4.0 - 5.6 %   HbA1c POC (<> result, manual entry)     HbA1c, POC (prediabetic range)     HbA1c, POC (controlled diabetic range)        The 10-year ASCVD risk score (Arnett DK, et al., 2019) is: 21.8%    Assessment & Plan:   Problem List Items Addressed This Visit       Unprioritized   Type 2 diabetes mellitus with hyperglycemia (HCC) - Primary   Relevant Orders   POC HgB A1c (Completed)   Other Visit Diagnoses       Unintended weight loss       Relevant Orders   TSH   CBC with Differential/Platelet   CMP   Sedimentation rate   Urinalysis w microscopic + reflex cultur     [name] is seen for follow-up regarding diabetes.  A1c still suboptimally controlled at 7.7% but is improved compared to 8.2 recently.  He is still reluctant to consider additional medications.  Would like to give this 3 more months of continued lifestyle management.  -We discussed strategies for healthy weight gain with increased resistance training and consider lower glycemic protein supplements such as Glucerna or Premier protein daily  -Bilateral conjunctivitis symptoms and we recommended Tobrex  eyedrops 2 drops each eye every 4 hours while awake along with warm compresses several times daily  -Obtaining  labs as above to assess his unintentional weight loss.  He is aware that Jardiance  may be causing some of the weight loss but should be plateauing.  Return in about 3 months (around 07/13/2024).    Wolm Scarlet, MD

## 2024-04-12 NOTE — Patient Instructions (Signed)
 A1C today 7.7 (was 8.2).   goal < 7  Consider nutrition supplement such as Glucerna or Premier protein.

## 2024-04-13 LAB — URINALYSIS W MICROSCOPIC + REFLEX CULTURE
Bacteria, UA: NONE SEEN /HPF
Bilirubin Urine: NEGATIVE
Hgb urine dipstick: NEGATIVE
Hyaline Cast: NONE SEEN /LPF
Leukocyte Esterase: NEGATIVE
Nitrites, Initial: NEGATIVE
Protein, ur: NEGATIVE
RBC / HPF: NONE SEEN /HPF (ref 0–2)
Specific Gravity, Urine: 1.025 (ref 1.001–1.035)
Squamous Epithelial / HPF: NONE SEEN /HPF (ref <=5)
WBC, UA: NONE SEEN /HPF (ref 0–5)
pH: <=5 — AB (ref 5.0–8.0)

## 2024-04-13 LAB — NO CULTURE INDICATED

## 2024-06-01 ENCOUNTER — Other Ambulatory Visit: Payer: Self-pay | Admitting: Cardiology

## 2024-06-04 ENCOUNTER — Other Ambulatory Visit: Payer: Self-pay | Admitting: Family Medicine

## 2024-06-04 DIAGNOSIS — N529 Male erectile dysfunction, unspecified: Secondary | ICD-10-CM

## 2024-07-13 ENCOUNTER — Encounter: Payer: Self-pay | Admitting: Family Medicine

## 2024-07-13 ENCOUNTER — Ambulatory Visit: Admitting: Family Medicine

## 2024-07-13 VITALS — BP 104/70 | HR 72 | Temp 97.7°F | Wt 175.2 lb

## 2024-07-13 DIAGNOSIS — E1165 Type 2 diabetes mellitus with hyperglycemia: Secondary | ICD-10-CM | POA: Diagnosis not present

## 2024-07-13 DIAGNOSIS — I4892 Unspecified atrial flutter: Secondary | ICD-10-CM

## 2024-07-13 DIAGNOSIS — Z7984 Long term (current) use of oral hypoglycemic drugs: Secondary | ICD-10-CM | POA: Diagnosis not present

## 2024-07-13 LAB — POCT GLYCOSYLATED HEMOGLOBIN (HGB A1C): Hemoglobin A1C: 8 % — AB (ref 4.0–5.6)

## 2024-07-13 MED ORDER — GLIPIZIDE 5 MG PO TABS: 5.0000 mg | ORAL_TABLET | Freq: Every day | ORAL | 3 refills | Status: AC

## 2024-07-13 MED ORDER — EMPAGLIFLOZIN 10 MG PO TABS
10.0000 mg | ORAL_TABLET | Freq: Every day | ORAL | 1 refills | Status: AC
Start: 2024-07-13 — End: ?

## 2024-07-13 NOTE — Progress Notes (Signed)
 Established Patient Office Visit  Subjective   Patient ID: Terry Johns, male    DOB: February 15, 1956  Age: 68 y.o. MRN: [id number]  Chief Complaint  Patient presents with   Medical Management of Chronic Issues    HPI   Terry Johns has a history of type 2 diabetes, Ehlers-Danlos syndrome, atrial flutter, history of mitral valve repair.  Seen today for diabetes follow-up.  Last A1c 7.7%.  He has concerns because of some weight loss and this has stabilized.  He actually gained 4 pounds since last visit.  He has been more diligent with protein supplements.  Still poor dietary compliance at times.  He states he has a sweet tooth .  He states his smart watch recently indicated 8 to 14% atrial fibrillation from early October to early November.  He is asymptomatic during episodes.  He has not contacted his cardiologist yet.  Denies any recent dizziness, syncope, chest pain, or shortness of breath.  Diabetes currently treated with metformin  and Jardiance .  Urine microalbumin screen last February normal.  Needs diabetic eye exam.  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   EXTERNAL HEMORRHOIDS 05/17/2009   MITRAL VALVE PROLAPSE 05/17/2009   Past Surgical History:  Procedure Laterality Date   CARDIAC CATHETERIZATION     COLONOSCOPY  2008   FEMORAL ARTERY EXPLORATION Right 12/09/2016   Procedure: Evacuation of Hematoma Right Groin, Repair of Right Superfiical Femoral Artery;  Surgeon: Lonni GORMAN Blade, MD;  Location: [facility] OR;  Service: Vascular;  Laterality: Right;   PVC ABLATION  12/08/2016   PVC ABLATION N/A 12/08/2016   Procedure: PVC Ablation;  Surgeon: Will Gladis Norton, MD;  Location: [facility] INVASIVE CV LAB;  Service: Cardiovascular;  Laterality: N/A;   RIGHT/LEFT HEART CATH AND CORONARY ANGIOGRAPHY N/A 07/29/2021   Procedure:  RIGHT/LEFT HEART CATH AND CORONARY ANGIOGRAPHY;  Surgeon: Verlin Lonni BIRCH, MD;  Location: [facility] INVASIVE CV LAB;  Service: Cardiovascular;  Laterality: N/A;   SHOULDER ARTHROSCOPY WITH ROTATOR CUFF REPAIR Left 2017   Terry Johns surgery   TEE WITHOUT CARDIOVERSION N/A 07/29/2021   Procedure: TRANSESOPHAGEAL ECHOCARDIOGRAM (TEE);  Surgeon: Delford Maude JAYSON, MD;  Location: [facility] ENDOSCOPY;  Service: Cardiovascular;  Laterality: N/A;    reports that he has quit smoking. His smoking use included cigarettes. He has a 0.3 pack-year smoking history. He has never used smokeless tobacco. He reports that he does not drink alcohol and does not use drugs. family history includes Breast cancer in his mother; Cancer in his father; Diabetes in his father; Hypertension in his father; Parkinson's disease in his father. No Known Allergies  Review of Systems  Constitutional:  Negative for chills, fever and malaise/fatigue.  Eyes:  Negative for blurred vision.  Respiratory:  Negative for shortness of breath.   Cardiovascular:  Negative for chest pain.  Genitourinary:  Negative for dysuria.  Neurological:  Negative for dizziness, weakness and headaches.      Objective:     BP 104/70   Pulse 72   Temp 97.7 F (36.5 C) (Oral)   Wt 175 lb 3.2 oz (79.5 kg)   SpO2 96%   BMI 23.11 kg/m  BP Readings from Last 3 Encounters:  07/13/24 104/70  04/12/24 124/84  01/11/24 94/66   Wt Readings from Last 3 Encounters:  07/13/24 175 lb 3.2 oz (79.5 kg)  04/12/24  171 lb 3.2 oz (77.7 kg)  01/11/24 174 lb (78.9 kg)      Physical Exam Vitals reviewed.  Constitutional:      General: He is not in acute distress.    Appearance: He is well-developed. He is not ill-appearing.  Eyes:     Pupils: Pupils are equal, round, and reactive to light.  Neck:     Thyroid : No thyromegaly.  Cardiovascular:     Rate and Rhythm: Normal rate and regular rhythm.  Pulmonary:     Effort: Pulmonary effort is normal. No  respiratory distress.     Breath sounds: Normal breath sounds. No wheezing or rales.  Musculoskeletal:     Cervical back: Neck supple.  Neurological:     Mental Status: He is alert and oriented to person, place, and time.      Results for orders placed or performed in visit on 07/13/24  POC HgB A1c  Result Value Ref Range   Hemoglobin A1C 8.0 (A) 4.0 - 5.6 %   HbA1c POC (<> result, manual entry)     HbA1c, POC (prediabetic range)     HbA1c, POC (controlled diabetic range)        The 10-year ASCVD risk score (Arnett DK, et al., 2019) is: 17.9%    Assessment & Plan:   #1 type 2 diabetes suboptimal control with A1c today 8.0%.  Continue Jardiance  and metformin .  Add glipizide 5 mg with breakfast.  Cautioned about potential for hypoglycemia.  Set up 51-month follow-up.  Set up diabetic eye exam.  Check urine microalbumin screen at follow-up  #2 history of intermittent A-fib/flutter.  Patient states he had 8 to 14% A-fib during the past month.  Normal rhythm today.  Recommend he contact his cardiology EP specialist to get follow-up soon     Wolm Scarlet, MD

## 2024-07-13 NOTE — Patient Instructions (Signed)
 A1C today 8.0 (goal < 7.0)  Add the Glipizide 5 mg one daily  Set up 3 month follow up.

## 2024-08-28 ENCOUNTER — Other Ambulatory Visit: Payer: Self-pay | Admitting: Family Medicine

## 2024-08-28 ENCOUNTER — Other Ambulatory Visit: Payer: Self-pay | Admitting: Cardiology

## 2024-08-28 DIAGNOSIS — N529 Male erectile dysfunction, unspecified: Secondary | ICD-10-CM

## 2024-09-10 ENCOUNTER — Other Ambulatory Visit: Payer: Self-pay | Admitting: Family Medicine

## 2024-10-14 ENCOUNTER — Ambulatory Visit: Admitting: Family Medicine

## 2024-11-24 ENCOUNTER — Ambulatory Visit: Payer: Self-pay | Admitting: Cardiology
